# Patient Record
Sex: Female | Born: 1965
Health system: Southern US, Community
[De-identification: ages and names within clinical notes are randomized; demographics above are authoritative.]

## PROBLEM LIST (undated history)

## (undated) DIAGNOSIS — J4489 Other specified chronic obstructive pulmonary disease: Secondary | ICD-10-CM

## (undated) DIAGNOSIS — E669 Obesity, unspecified: Secondary | ICD-10-CM

## (undated) DIAGNOSIS — F329 Major depressive disorder, single episode, unspecified: Secondary | ICD-10-CM

## (undated) DIAGNOSIS — L80 Vitiligo: Secondary | ICD-10-CM

## (undated) DIAGNOSIS — Z9289 Personal history of other medical treatment: Secondary | ICD-10-CM

## (undated) DIAGNOSIS — I10 Essential (primary) hypertension: Secondary | ICD-10-CM

## (undated) DIAGNOSIS — E785 Hyperlipidemia, unspecified: Secondary | ICD-10-CM

## (undated) DIAGNOSIS — M1990 Unspecified osteoarthritis, unspecified site: Secondary | ICD-10-CM

## (undated) DIAGNOSIS — F32A Depression, unspecified: Secondary | ICD-10-CM

## (undated) DIAGNOSIS — G4730 Sleep apnea, unspecified: Secondary | ICD-10-CM

## (undated) DIAGNOSIS — E119 Type 2 diabetes mellitus without complications: Secondary | ICD-10-CM

## (undated) DIAGNOSIS — J449 Chronic obstructive pulmonary disease, unspecified: Secondary | ICD-10-CM

## (undated) DIAGNOSIS — I809 Phlebitis and thrombophlebitis of unspecified site: Secondary | ICD-10-CM

## (undated) DIAGNOSIS — I422 Other hypertrophic cardiomyopathy: Secondary | ICD-10-CM

## (undated) DIAGNOSIS — K219 Gastro-esophageal reflux disease without esophagitis: Secondary | ICD-10-CM

## (undated) DIAGNOSIS — K029 Dental caries, unspecified: Secondary | ICD-10-CM

## (undated) DIAGNOSIS — F419 Anxiety disorder, unspecified: Secondary | ICD-10-CM

## (undated) DIAGNOSIS — IMO0001 Reserved for inherently not codable concepts without codable children: Secondary | ICD-10-CM

## (undated) HISTORY — DX: Other hypertrophic cardiomyopathy: I42.2

## (undated) HISTORY — PX: DILATION AND CURETTAGE OF UTERUS: SHX78

## (undated) HISTORY — DX: Vitiligo: L80

## (undated) HISTORY — PX: CHOLECYSTECTOMY: SHX55

---

## 1998-09-20 ENCOUNTER — Emergency Department (HOSPITAL_COMMUNITY): Admission: EM | Admit: 1998-09-20 | Discharge: 1998-09-20 | Payer: Self-pay | Admitting: Emergency Medicine

## 1998-09-20 ENCOUNTER — Encounter: Payer: Self-pay | Admitting: Emergency Medicine

## 1999-02-21 ENCOUNTER — Other Ambulatory Visit: Admission: RE | Admit: 1999-02-21 | Discharge: 1999-02-21 | Payer: Self-pay | Admitting: Obstetrics and Gynecology

## 1999-10-28 ENCOUNTER — Emergency Department (HOSPITAL_COMMUNITY): Admission: EM | Admit: 1999-10-28 | Discharge: 1999-10-28 | Payer: Self-pay | Admitting: Emergency Medicine

## 1999-10-28 ENCOUNTER — Encounter: Payer: Self-pay | Admitting: Emergency Medicine

## 2000-07-25 ENCOUNTER — Encounter: Admission: RE | Admit: 2000-07-25 | Discharge: 2000-07-25 | Payer: Self-pay | Admitting: Family Medicine

## 2000-08-14 ENCOUNTER — Encounter: Admission: RE | Admit: 2000-08-14 | Discharge: 2000-08-14 | Payer: Self-pay | Admitting: Family Medicine

## 2001-03-21 ENCOUNTER — Encounter: Admission: RE | Admit: 2001-03-21 | Discharge: 2001-03-21 | Payer: Self-pay | Admitting: Sports Medicine

## 2001-05-05 ENCOUNTER — Encounter: Admission: RE | Admit: 2001-05-05 | Discharge: 2001-05-05 | Payer: Self-pay | Admitting: Sports Medicine

## 2001-06-12 ENCOUNTER — Encounter: Admission: RE | Admit: 2001-06-12 | Discharge: 2001-06-12 | Payer: Self-pay | Admitting: Family Medicine

## 2001-08-06 ENCOUNTER — Encounter: Admission: RE | Admit: 2001-08-06 | Discharge: 2001-08-06 | Payer: Self-pay | Admitting: Family Medicine

## 2001-10-29 ENCOUNTER — Emergency Department (HOSPITAL_COMMUNITY): Admission: EM | Admit: 2001-10-29 | Discharge: 2001-10-29 | Payer: Self-pay

## 2002-01-22 ENCOUNTER — Emergency Department (HOSPITAL_COMMUNITY): Admission: EM | Admit: 2002-01-22 | Discharge: 2002-01-23 | Payer: Self-pay | Admitting: Emergency Medicine

## 2002-01-22 ENCOUNTER — Encounter: Payer: Self-pay | Admitting: Emergency Medicine

## 2002-01-26 ENCOUNTER — Encounter: Payer: Self-pay | Admitting: Surgery

## 2002-01-27 ENCOUNTER — Ambulatory Visit (HOSPITAL_COMMUNITY): Admission: RE | Admit: 2002-01-27 | Discharge: 2002-01-28 | Payer: Self-pay | Admitting: Surgery

## 2002-01-27 ENCOUNTER — Encounter (INDEPENDENT_AMBULATORY_CARE_PROVIDER_SITE_OTHER): Payer: Self-pay | Admitting: Specialist

## 2002-03-01 ENCOUNTER — Emergency Department (HOSPITAL_COMMUNITY): Admission: EM | Admit: 2002-03-01 | Discharge: 2002-03-01 | Payer: Self-pay

## 2002-08-24 ENCOUNTER — Encounter (INDEPENDENT_AMBULATORY_CARE_PROVIDER_SITE_OTHER): Payer: Self-pay | Admitting: *Deleted

## 2002-08-24 LAB — CONVERTED CEMR LAB

## 2002-09-15 ENCOUNTER — Other Ambulatory Visit: Admission: RE | Admit: 2002-09-15 | Discharge: 2002-09-15 | Payer: Self-pay | Admitting: Gynecology

## 2002-09-21 ENCOUNTER — Encounter: Admission: RE | Admit: 2002-09-21 | Discharge: 2002-09-21 | Payer: Self-pay | Admitting: Family Medicine

## 2002-10-09 ENCOUNTER — Encounter: Admission: RE | Admit: 2002-10-09 | Discharge: 2002-10-09 | Payer: Self-pay | Admitting: Family Medicine

## 2002-10-19 ENCOUNTER — Ambulatory Visit (HOSPITAL_COMMUNITY): Admission: RE | Admit: 2002-10-19 | Discharge: 2002-10-19 | Payer: Self-pay | Admitting: Gynecology

## 2002-10-19 ENCOUNTER — Encounter (INDEPENDENT_AMBULATORY_CARE_PROVIDER_SITE_OTHER): Payer: Self-pay

## 2002-11-24 ENCOUNTER — Ambulatory Visit: Admission: RE | Admit: 2002-11-24 | Discharge: 2002-11-24 | Payer: Self-pay | Admitting: Gynecology

## 2002-11-25 ENCOUNTER — Encounter: Admission: RE | Admit: 2002-11-25 | Discharge: 2002-11-25 | Payer: Self-pay | Admitting: Family Medicine

## 2002-11-26 ENCOUNTER — Encounter: Admission: RE | Admit: 2002-11-26 | Discharge: 2002-11-26 | Payer: Self-pay | Admitting: Sports Medicine

## 2002-11-30 ENCOUNTER — Encounter: Admission: RE | Admit: 2002-11-30 | Discharge: 2002-11-30 | Payer: Self-pay | Admitting: Sports Medicine

## 2003-01-25 ENCOUNTER — Encounter: Admission: RE | Admit: 2003-01-25 | Discharge: 2003-01-25 | Payer: Self-pay | Admitting: Sports Medicine

## 2003-01-26 ENCOUNTER — Encounter: Admission: RE | Admit: 2003-01-26 | Discharge: 2003-01-26 | Payer: Self-pay | Admitting: Family Medicine

## 2003-01-27 ENCOUNTER — Encounter: Admission: RE | Admit: 2003-01-27 | Discharge: 2003-01-27 | Payer: Self-pay | Admitting: Family Medicine

## 2003-06-01 ENCOUNTER — Encounter: Admission: RE | Admit: 2003-06-01 | Discharge: 2003-06-01 | Payer: Self-pay | Admitting: Family Medicine

## 2003-08-05 ENCOUNTER — Emergency Department (HOSPITAL_COMMUNITY): Admission: EM | Admit: 2003-08-05 | Discharge: 2003-08-05 | Payer: Self-pay | Admitting: Emergency Medicine

## 2004-02-24 ENCOUNTER — Ambulatory Visit: Payer: Self-pay | Admitting: Sports Medicine

## 2004-03-06 ENCOUNTER — Emergency Department (HOSPITAL_COMMUNITY): Admission: EM | Admit: 2004-03-06 | Discharge: 2004-03-06 | Payer: Self-pay | Admitting: Emergency Medicine

## 2004-05-25 ENCOUNTER — Ambulatory Visit (HOSPITAL_COMMUNITY): Admission: RE | Admit: 2004-05-25 | Discharge: 2004-05-25 | Payer: Self-pay | Admitting: General Surgery

## 2004-05-25 HISTORY — PX: UMBILICAL HERNIA REPAIR: SHX196

## 2004-05-28 ENCOUNTER — Inpatient Hospital Stay (HOSPITAL_COMMUNITY): Admission: EM | Admit: 2004-05-28 | Discharge: 2004-06-01 | Payer: Self-pay | Admitting: Emergency Medicine

## 2005-02-15 ENCOUNTER — Ambulatory Visit (HOSPITAL_COMMUNITY): Admission: RE | Admit: 2005-02-15 | Discharge: 2005-02-15 | Payer: Self-pay | Admitting: Emergency Medicine

## 2006-08-22 DIAGNOSIS — I10 Essential (primary) hypertension: Secondary | ICD-10-CM

## 2006-08-22 DIAGNOSIS — E785 Hyperlipidemia, unspecified: Secondary | ICD-10-CM

## 2006-08-23 ENCOUNTER — Encounter (INDEPENDENT_AMBULATORY_CARE_PROVIDER_SITE_OTHER): Payer: Self-pay | Admitting: *Deleted

## 2006-08-30 ENCOUNTER — Emergency Department (HOSPITAL_COMMUNITY): Admission: EM | Admit: 2006-08-30 | Discharge: 2006-08-31 | Payer: Self-pay | Admitting: Emergency Medicine

## 2006-08-31 ENCOUNTER — Ambulatory Visit: Payer: Self-pay | Admitting: Pulmonary Disease

## 2006-08-31 ENCOUNTER — Emergency Department (HOSPITAL_COMMUNITY): Admission: EM | Admit: 2006-08-31 | Discharge: 2006-08-31 | Payer: Self-pay | Admitting: Emergency Medicine

## 2006-08-31 ENCOUNTER — Inpatient Hospital Stay (HOSPITAL_COMMUNITY): Admission: EM | Admit: 2006-08-31 | Discharge: 2006-09-03 | Payer: Self-pay | Admitting: Emergency Medicine

## 2006-12-30 ENCOUNTER — Encounter (HOSPITAL_BASED_OUTPATIENT_CLINIC_OR_DEPARTMENT_OTHER): Admission: RE | Admit: 2006-12-30 | Discharge: 2007-01-15 | Payer: Self-pay | Admitting: Internal Medicine

## 2007-05-07 ENCOUNTER — Ambulatory Visit: Payer: Self-pay | Admitting: Family Medicine

## 2007-05-07 ENCOUNTER — Ambulatory Visit: Payer: Self-pay | Admitting: *Deleted

## 2007-07-07 ENCOUNTER — Ambulatory Visit: Payer: Self-pay | Admitting: Family Medicine

## 2007-07-07 ENCOUNTER — Ambulatory Visit: Payer: Self-pay | Admitting: Internal Medicine

## 2007-07-07 ENCOUNTER — Encounter: Payer: Self-pay | Admitting: Family Medicine

## 2007-07-07 LAB — CONVERTED CEMR LAB
ALT: 18 units/L (ref 0–35)
Albumin: 3.9 g/dL (ref 3.5–5.2)
BUN: 11 mg/dL (ref 6–23)
CO2: 25 meq/L (ref 19–32)
Cholesterol: 215 mg/dL — ABNORMAL HIGH (ref 0–200)
Creatinine, Ser: 0.82 mg/dL (ref 0.40–1.20)
LDL Cholesterol: 137 mg/dL — ABNORMAL HIGH (ref 0–99)
Sodium: 140 meq/L (ref 135–145)
TSH: 2.131 microintl units/mL (ref 0.350–5.50)
Total CHOL/HDL Ratio: 5.2
Total Protein: 7.6 g/dL (ref 6.0–8.3)

## 2007-07-08 ENCOUNTER — Ambulatory Visit (HOSPITAL_COMMUNITY): Admission: RE | Admit: 2007-07-08 | Discharge: 2007-07-08 | Payer: Self-pay | Admitting: Family Medicine

## 2007-09-03 ENCOUNTER — Ambulatory Visit: Payer: Self-pay | Admitting: Internal Medicine

## 2007-09-11 ENCOUNTER — Ambulatory Visit: Payer: Self-pay | Admitting: Family Medicine

## 2007-10-14 ENCOUNTER — Ambulatory Visit: Payer: Self-pay | Admitting: Internal Medicine

## 2007-10-27 ENCOUNTER — Ambulatory Visit: Payer: Self-pay | Admitting: Internal Medicine

## 2007-11-13 ENCOUNTER — Ambulatory Visit: Payer: Self-pay | Admitting: Family Medicine

## 2007-11-13 ENCOUNTER — Ambulatory Visit: Payer: Self-pay | Admitting: Internal Medicine

## 2007-11-13 ENCOUNTER — Encounter: Payer: Self-pay | Admitting: Family Medicine

## 2007-11-13 LAB — CONVERTED CEMR LAB
ALT: 19 units/L (ref 0–35)
Basophils Absolute: 0.1 10*3/uL (ref 0.0–0.1)
CO2: 24 meq/L (ref 19–32)
Chloride: 104 meq/L (ref 96–112)
Cholesterol: 156 mg/dL (ref 0–200)
Creatinine, Ser: 0.9 mg/dL (ref 0.40–1.20)
HCT: 40.5 % (ref 36.0–46.0)
Hemoglobin: 12.3 g/dL (ref 12.0–15.0)
Lymphocytes Relative: 26 % (ref 12–46)
Lymphs Abs: 2.9 10*3/uL (ref 0.7–4.0)
MCV: 90.8 fL (ref 78.0–100.0)
Monocytes Absolute: 0.8 10*3/uL (ref 0.1–1.0)
Neutro Abs: 7.3 10*3/uL (ref 1.7–7.7)
Neutrophils Relative %: 65 % (ref 43–77)
Platelets: 316 10*3/uL (ref 150–400)
RBC: 4.46 M/uL (ref 3.87–5.11)
RDW: 16.7 % — ABNORMAL HIGH (ref 11.5–15.5)
Sodium: 140 meq/L (ref 135–145)
Total CHOL/HDL Ratio: 3.8
Total Protein: 7.7 g/dL (ref 6.0–8.3)
Triglycerides: 103 mg/dL (ref <150)
VLDL: 21 mg/dL (ref 0–40)

## 2007-12-08 ENCOUNTER — Ambulatory Visit: Payer: Self-pay | Admitting: Internal Medicine

## 2008-02-16 ENCOUNTER — Ambulatory Visit: Payer: Self-pay | Admitting: Internal Medicine

## 2008-04-13 ENCOUNTER — Emergency Department (HOSPITAL_COMMUNITY): Admission: EM | Admit: 2008-04-13 | Discharge: 2008-04-13 | Payer: Self-pay | Admitting: Emergency Medicine

## 2008-04-21 ENCOUNTER — Ambulatory Visit: Payer: Self-pay | Admitting: Family Medicine

## 2008-05-13 ENCOUNTER — Ambulatory Visit: Payer: Self-pay | Admitting: Internal Medicine

## 2008-07-14 ENCOUNTER — Ambulatory Visit: Payer: Self-pay | Admitting: Family Medicine

## 2008-09-09 ENCOUNTER — Ambulatory Visit: Payer: Self-pay | Admitting: Internal Medicine

## 2008-09-21 ENCOUNTER — Encounter: Payer: Self-pay | Admitting: Family Medicine

## 2008-09-21 ENCOUNTER — Ambulatory Visit: Payer: Self-pay | Admitting: Internal Medicine

## 2008-09-21 LAB — CONVERTED CEMR LAB
Cholesterol: 156 mg/dL (ref 0–200)
LDL Cholesterol: 95 mg/dL (ref 0–99)
Total CHOL/HDL Ratio: 4.2
Triglycerides: 120 mg/dL (ref <150)
VLDL: 24 mg/dL (ref 0–40)

## 2008-09-27 ENCOUNTER — Ambulatory Visit: Payer: Self-pay | Admitting: Internal Medicine

## 2008-09-27 ENCOUNTER — Encounter: Payer: Self-pay | Admitting: Family Medicine

## 2008-09-27 LAB — CONVERTED CEMR LAB: Chlamydia, DNA Probe: NEGATIVE

## 2008-10-01 ENCOUNTER — Ambulatory Visit (HOSPITAL_COMMUNITY): Admission: RE | Admit: 2008-10-01 | Discharge: 2008-10-01 | Payer: Self-pay | Admitting: Family Medicine

## 2008-11-26 ENCOUNTER — Ambulatory Visit: Payer: Self-pay | Admitting: Internal Medicine

## 2008-12-30 ENCOUNTER — Emergency Department (HOSPITAL_COMMUNITY): Admission: EM | Admit: 2008-12-30 | Discharge: 2008-12-30 | Payer: Self-pay | Admitting: Emergency Medicine

## 2009-01-31 ENCOUNTER — Telehealth (INDEPENDENT_AMBULATORY_CARE_PROVIDER_SITE_OTHER): Payer: Self-pay | Admitting: *Deleted

## 2009-02-21 ENCOUNTER — Ambulatory Visit: Payer: Self-pay | Admitting: Internal Medicine

## 2009-02-21 ENCOUNTER — Encounter: Payer: Self-pay | Admitting: Family Medicine

## 2009-02-21 LAB — CONVERTED CEMR LAB
Alkaline Phosphatase: 76 units/L (ref 39–117)
BUN: 9 mg/dL (ref 6–23)
Calcium: 9.3 mg/dL (ref 8.4–10.5)
Glucose, Bld: 97 mg/dL (ref 70–99)
LDL Cholesterol: 105 mg/dL — ABNORMAL HIGH (ref 0–99)
Total Bilirubin: 0.4 mg/dL (ref 0.3–1.2)
Total CHOL/HDL Ratio: 4.9

## 2009-02-25 ENCOUNTER — Ambulatory Visit: Payer: Self-pay | Admitting: Internal Medicine

## 2009-03-29 ENCOUNTER — Ambulatory Visit: Payer: Self-pay | Admitting: Family Medicine

## 2009-05-16 ENCOUNTER — Ambulatory Visit: Payer: Self-pay | Admitting: Family Medicine

## 2009-08-04 ENCOUNTER — Ambulatory Visit: Payer: Self-pay | Admitting: Internal Medicine

## 2009-08-17 ENCOUNTER — Ambulatory Visit: Payer: Self-pay | Admitting: Internal Medicine

## 2009-08-17 LAB — CONVERTED CEMR LAB
ALT: 24 units/L (ref 0–35)
AST: 24 units/L (ref 0–37)
BUN: 10 mg/dL (ref 6–23)
Calcium: 9.2 mg/dL (ref 8.4–10.5)
Eosinophils Relative: 2 % (ref 0–5)
Ferritin: 116 ng/mL (ref 10–291)
HCT: 40.6 % (ref 36.0–46.0)
Hemoglobin: 12.5 g/dL (ref 12.0–15.0)
Hgb A1c MFr Bld: 5.6 % (ref 4.6–6.1)
MCV: 89.4 fL (ref 78.0–100.0)
Monocytes Absolute: 0.6 10*3/uL (ref 0.1–1.0)
Monocytes Relative: 7 % (ref 3–12)
RBC: 4.54 M/uL (ref 3.87–5.11)
RDW: 15.3 % (ref 11.5–15.5)
Saturation Ratios: 15 % — ABNORMAL LOW (ref 20–55)
Sodium: 140 meq/L (ref 135–145)
TIBC: 283 ug/dL (ref 250–470)
Total Bilirubin: 0.4 mg/dL (ref 0.3–1.2)
Total Protein: 7.4 g/dL (ref 6.0–8.3)
UIBC: 241 ug/dL
WBC: 9.4 10*3/uL (ref 4.0–10.5)

## 2009-09-22 ENCOUNTER — Emergency Department (HOSPITAL_COMMUNITY): Admission: EM | Admit: 2009-09-22 | Discharge: 2009-09-22 | Payer: Self-pay | Admitting: Emergency Medicine

## 2009-10-20 ENCOUNTER — Emergency Department (HOSPITAL_BASED_OUTPATIENT_CLINIC_OR_DEPARTMENT_OTHER): Admission: EM | Admit: 2009-10-20 | Discharge: 2009-10-20 | Payer: Self-pay | Admitting: Emergency Medicine

## 2009-10-31 ENCOUNTER — Ambulatory Visit: Payer: Self-pay | Admitting: Internal Medicine

## 2009-11-03 ENCOUNTER — Emergency Department (HOSPITAL_COMMUNITY): Admission: EM | Admit: 2009-11-03 | Discharge: 2009-11-03 | Payer: Self-pay | Admitting: Emergency Medicine

## 2009-11-09 ENCOUNTER — Inpatient Hospital Stay (HOSPITAL_COMMUNITY): Admission: EM | Admit: 2009-11-09 | Discharge: 2009-11-11 | Payer: Self-pay | Admitting: Emergency Medicine

## 2009-11-09 ENCOUNTER — Telehealth (INDEPENDENT_AMBULATORY_CARE_PROVIDER_SITE_OTHER): Payer: Self-pay | Admitting: *Deleted

## 2009-11-29 ENCOUNTER — Ambulatory Visit: Payer: Self-pay | Admitting: Family Medicine

## 2009-11-29 ENCOUNTER — Encounter (INDEPENDENT_AMBULATORY_CARE_PROVIDER_SITE_OTHER): Payer: Self-pay | Admitting: Internal Medicine

## 2009-11-29 ENCOUNTER — Ambulatory Visit: Payer: Self-pay | Admitting: Internal Medicine

## 2009-11-29 LAB — CONVERTED CEMR LAB: IgE (Immunoglobulin E), Serum: 214.8 intl units/mL — ABNORMAL HIGH (ref 0.0–180.0)

## 2009-12-06 ENCOUNTER — Emergency Department (HOSPITAL_COMMUNITY): Admission: EM | Admit: 2009-12-06 | Discharge: 2009-12-07 | Payer: Self-pay | Admitting: Emergency Medicine

## 2009-12-12 ENCOUNTER — Ambulatory Visit: Payer: Self-pay | Admitting: Internal Medicine

## 2009-12-12 LAB — CONVERTED CEMR LAB
AST: 22 units/L (ref 0–37)
BUN: 17 mg/dL (ref 6–23)
Creatinine, Ser: 0.88 mg/dL (ref 0.40–1.20)
Glucose, Bld: 94 mg/dL (ref 70–99)
Magnesium: 2.1 mg/dL (ref 1.5–2.5)
Potassium: 4.2 meq/L (ref 3.5–5.3)
Total Bilirubin: 0.2 mg/dL — ABNORMAL LOW (ref 0.3–1.2)
Total Protein: 7 g/dL (ref 6.0–8.3)

## 2009-12-21 ENCOUNTER — Ambulatory Visit: Payer: Self-pay | Admitting: Internal Medicine

## 2010-01-04 ENCOUNTER — Ambulatory Visit: Payer: Self-pay | Admitting: Internal Medicine

## 2010-01-04 LAB — CONVERTED CEMR LAB: Anti Nuclear Antibody(ANA): NEGATIVE

## 2010-02-10 ENCOUNTER — Ambulatory Visit: Payer: Self-pay | Admitting: Internal Medicine

## 2010-02-24 ENCOUNTER — Ambulatory Visit: Payer: Self-pay | Admitting: Family Medicine

## 2010-02-24 LAB — CONVERTED CEMR LAB
Anti Nuclear Antibody(ANA): NEGATIVE
CRP: 1.2 mg/dL — ABNORMAL HIGH (ref <0.6)
Rhuematoid fact SerPl-aCnc: 20 intl units/mL (ref 0–20)

## 2010-03-14 ENCOUNTER — Encounter (INDEPENDENT_AMBULATORY_CARE_PROVIDER_SITE_OTHER): Payer: Self-pay | Admitting: Family Medicine

## 2010-03-14 ENCOUNTER — Ambulatory Visit: Payer: Self-pay | Admitting: Surgery

## 2010-03-14 ENCOUNTER — Ambulatory Visit (HOSPITAL_COMMUNITY): Admission: RE | Admit: 2010-03-14 | Discharge: 2010-03-14 | Payer: Self-pay | Admitting: Family Medicine

## 2010-04-11 ENCOUNTER — Ambulatory Visit: Payer: Self-pay | Admitting: Internal Medicine

## 2010-05-12 ENCOUNTER — Encounter (INDEPENDENT_AMBULATORY_CARE_PROVIDER_SITE_OTHER): Payer: Self-pay | Admitting: *Deleted

## 2010-07-04 ENCOUNTER — Emergency Department (HOSPITAL_COMMUNITY)
Admission: EM | Admit: 2010-07-04 | Discharge: 2010-07-04 | Payer: Self-pay | Source: Home / Self Care | Admitting: Emergency Medicine

## 2010-07-27 NOTE — Progress Notes (Signed)
Summary: triage/SOB  Phone Note Call from Patient   Caller: Patient Reason for Call: Talk to Nurse Summary of Call: patient called stating she had seen Dr. Pamalee Leyden on 5/9 forAsthmatic Bronchitis on then went to ED on 5/12 and was treated there...she states she has finished all of her medication and has inhalers and nothing is working.Marland KitchenMarland KitchenShe sounds SOB over the phone and I can hear her wheezing.Marland KitchenMarland KitchenPatient advised to return to ED as their discharge instructions suggest..Marland KitchenThere are no appointments or acute appointments in our schedule.Marland KitchenMarland KitchenPatient states understanding. Initial call taken by: Conchita Paris,  Nov 09, 2009 3:06 PM

## 2010-09-10 LAB — DIFFERENTIAL
Basophils Relative: 0 % (ref 0–1)
Eosinophils Relative: 1 % (ref 0–5)
Lymphocytes Relative: 21 % (ref 12–46)
Lymphs Abs: 3.3 10*3/uL (ref 0.7–4.0)
Monocytes Absolute: 1.6 10*3/uL — ABNORMAL HIGH (ref 0.1–1.0)
Neutrophils Relative %: 67 % (ref 43–77)

## 2010-09-10 LAB — BASIC METABOLIC PANEL
Calcium: 9 mg/dL (ref 8.4–10.5)
Creatinine, Ser: 0.93 mg/dL (ref 0.4–1.2)
GFR calc non Af Amer: >60 mL/min (ref >60)
Glucose, Bld: 123 mg/dL — ABNORMAL HIGH (ref 70–99)

## 2010-09-10 LAB — CBC
HCT: 33.3 % — ABNORMAL LOW (ref 36.0–46.0)
Hemoglobin: 11 g/dL — ABNORMAL LOW (ref 12.0–15.0)
MCHC: 33 g/dL (ref 30.0–36.0)
Platelets: 374 10*3/uL (ref 150–400)
RBC: 3.74 MIL/uL — ABNORMAL LOW (ref 3.87–5.11)

## 2010-09-11 LAB — BASIC METABOLIC PANEL
BUN: 9 mg/dL (ref 6–23)
CO2: 24 mEq/L (ref 19–32)
CO2: 27 mEq/L (ref 19–32)
Calcium: 9.2 mg/dL (ref 8.4–10.5)
Chloride: 104 mEq/L (ref 96–112)
Chloride: 105 mEq/L (ref 96–112)
Creatinine, Ser: 0.77 mg/dL (ref 0.4–1.2)
GFR calc Af Amer: >60 mL/min (ref >60)
Potassium: 4.5 mEq/L (ref 3.5–5.1)
Sodium: 137 mEq/L (ref 135–145)

## 2010-09-11 LAB — DIFFERENTIAL
Basophils Absolute: 0.2 10*3/uL — ABNORMAL HIGH (ref 0.0–0.1)
Basophils Relative: 1 % (ref 0–1)
Lymphs Abs: 3.4 10*3/uL (ref 0.7–4.0)
Monocytes Absolute: 1.1 10*3/uL — ABNORMAL HIGH (ref 0.1–1.0)
Monocytes Relative: 2 % — ABNORMAL LOW (ref 3–12)
Monocytes Relative: 6 % (ref 3–12)
Neutro Abs: 12.8 10*3/uL — ABNORMAL HIGH (ref 1.7–7.7)
Neutro Abs: 17 10*3/uL — ABNORMAL HIGH (ref 1.7–7.7)
Neutrophils Relative %: 93 % — ABNORMAL HIGH (ref 43–77)

## 2010-09-11 LAB — PHOSPHORUS: Phosphorus: 3 mg/dL (ref 2.3–4.6)

## 2010-09-11 LAB — CBC
HCT: 37.8 % (ref 36.0–46.0)
MCHC: 33 g/dL (ref 30.0–36.0)
MCHC: 33 g/dL (ref 30.0–36.0)
MCV: 88.2 fL (ref 78.0–100.0)
Platelets: 288 10*3/uL (ref 150–400)
RBC: 4.4 MIL/uL (ref 3.87–5.11)
WBC: 18 10*3/uL — ABNORMAL HIGH (ref 4.0–10.5)
WBC: 18.2 10*3/uL — ABNORMAL HIGH (ref 4.0–10.5)

## 2010-09-11 LAB — HEMOGLOBIN A1C
Hgb A1c MFr Bld: 5.6 % (ref <5.7)
Mean Plasma Glucose: 114 mg/dL (ref <117)

## 2010-09-11 LAB — CARDIAC PANEL(CRET KIN+CKTOT+MB+TROPI)
CK, MB: 2.3 ng/mL (ref 0.3–4.0)
Relative Index: INVALID (ref 0.0–2.5)
Total CK: 84 U/L (ref 7–177)

## 2010-09-11 LAB — POCT I-STAT 3, ART BLOOD GAS (G3+)
O2 Saturation: 97 %
Patient temperature: 98.1
TCO2: 25 mmol/L (ref 0–100)
pH, Arterial: 7.39 (ref 7.350–7.400)

## 2010-09-11 LAB — GLUCOSE, CAPILLARY
Glucose-Capillary: 140 mg/dL — ABNORMAL HIGH (ref 70–99)
Glucose-Capillary: 161 mg/dL — ABNORMAL HIGH (ref 70–99)
Glucose-Capillary: 184 mg/dL — ABNORMAL HIGH (ref 70–99)

## 2010-09-11 LAB — CK TOTAL AND CKMB (NOT AT ARMC)
CK, MB: 2.8 ng/mL (ref 0.3–4.0)
Relative Index: INVALID (ref 0.0–2.5)

## 2010-09-11 LAB — BRAIN NATRIURETIC PEPTIDE: Pro B Natriuretic peptide (BNP): 82 pg/mL (ref 0.0–100.0)

## 2010-09-11 LAB — TROPONIN I: Troponin I: 0.03 ng/mL (ref 0.00–0.06)

## 2010-09-12 LAB — URINALYSIS, ROUTINE W REFLEX MICROSCOPIC
Bilirubin Urine: NEGATIVE
Glucose, UA: NEGATIVE mg/dL
Hgb urine dipstick: NEGATIVE
Protein, ur: NEGATIVE mg/dL
Urobilinogen, UA: 0.2 mg/dL (ref 0.0–1.0)

## 2010-09-12 LAB — BASIC METABOLIC PANEL
CO2: 27 mEq/L (ref 19–32)
Chloride: 108 mEq/L (ref 96–112)
GFR calc Af Amer: >60 mL/min (ref >60)
Potassium: 3.8 mEq/L (ref 3.5–5.1)
Sodium: 146 mEq/L — ABNORMAL HIGH (ref 135–145)

## 2010-09-12 LAB — POCT I-STAT, CHEM 8
BUN: 11 mg/dL (ref 6–23)
Calcium, Ion: 1.21 mmol/L (ref 1.12–1.32)
Chloride: 103 mEq/L (ref 96–112)
HCT: 38 % (ref 36.0–46.0)
Potassium: 3.8 mEq/L (ref 3.5–5.1)
Sodium: 139 mEq/L (ref 135–145)

## 2010-09-12 LAB — BRAIN NATRIURETIC PEPTIDE: Pro B Natriuretic peptide (BNP): <30 pg/mL (ref 0.0–100.0)

## 2010-09-12 LAB — CBC
HCT: 39.1 % (ref 36.0–46.0)
Hemoglobin: 12 g/dL (ref 12.0–15.0)
Hemoglobin: 12.7 g/dL (ref 12.0–15.0)
MCHC: 33 g/dL (ref 30.0–36.0)
MCHC: 32.5 g/dL (ref 30.0–36.0)
MCV: 86.7 fL (ref 78.0–100.0)
RBC: 4.13 MIL/uL (ref 3.87–5.11)
RBC: 4.51 MIL/uL (ref 3.87–5.11)
RDW: 15.2 % (ref 11.5–15.5)
WBC: 10.8 10*3/uL — ABNORMAL HIGH (ref 4.0–10.5)

## 2010-09-12 LAB — POCT TOXICOLOGY PANEL

## 2010-09-12 LAB — DIFFERENTIAL
Basophils Absolute: 0 10*3/uL (ref 0.0–0.1)
Basophils Relative: 0 % (ref 0–1)
Basophils Relative: 1 % (ref 0–1)
Eosinophils Absolute: 0.2 10*3/uL (ref 0.0–0.7)
Lymphocytes Relative: 21 % (ref 12–46)
Lymphs Abs: 2.7 10*3/uL (ref 0.7–4.0)
Monocytes Absolute: 0.9 10*3/uL (ref 0.1–1.0)
Monocytes Absolute: 0.6 10*3/uL (ref 0.1–1.0)
Monocytes Relative: 6 % (ref 3–12)
Neutro Abs: 7.1 10*3/uL (ref 1.7–7.7)
Neutrophils Relative %: 67 % (ref 43–77)
Neutrophils Relative %: 67 % (ref 43–77)

## 2010-09-14 ENCOUNTER — Ambulatory Visit: Payer: Self-pay | Admitting: Rehabilitation

## 2010-09-26 ENCOUNTER — Ambulatory Visit: Payer: Self-pay | Attending: Family Medicine | Admitting: Rehabilitation

## 2010-09-26 DIAGNOSIS — IMO0001 Reserved for inherently not codable concepts without codable children: Secondary | ICD-10-CM | POA: Insufficient documentation

## 2010-09-26 DIAGNOSIS — R262 Difficulty in walking, not elsewhere classified: Secondary | ICD-10-CM | POA: Insufficient documentation

## 2010-09-26 DIAGNOSIS — R5381 Other malaise: Secondary | ICD-10-CM | POA: Insufficient documentation

## 2010-09-26 DIAGNOSIS — M25669 Stiffness of unspecified knee, not elsewhere classified: Secondary | ICD-10-CM | POA: Insufficient documentation

## 2010-09-26 DIAGNOSIS — M25569 Pain in unspecified knee: Secondary | ICD-10-CM | POA: Insufficient documentation

## 2010-10-02 ENCOUNTER — Ambulatory Visit: Payer: Self-pay

## 2010-10-04 ENCOUNTER — Ambulatory Visit: Payer: Self-pay

## 2010-10-10 ENCOUNTER — Ambulatory Visit: Payer: Self-pay | Admitting: Rehabilitation

## 2010-10-11 ENCOUNTER — Ambulatory Visit: Payer: Self-pay | Admitting: Rehabilitation

## 2010-10-16 ENCOUNTER — Ambulatory Visit: Payer: Self-pay

## 2010-10-18 ENCOUNTER — Ambulatory Visit: Payer: Self-pay

## 2010-10-23 ENCOUNTER — Ambulatory Visit: Payer: Self-pay

## 2010-10-25 ENCOUNTER — Ambulatory Visit: Payer: Self-pay | Attending: Family Medicine | Admitting: Physical Therapy

## 2010-10-25 DIAGNOSIS — R5381 Other malaise: Secondary | ICD-10-CM | POA: Insufficient documentation

## 2010-10-25 DIAGNOSIS — R262 Difficulty in walking, not elsewhere classified: Secondary | ICD-10-CM | POA: Insufficient documentation

## 2010-10-25 DIAGNOSIS — M25569 Pain in unspecified knee: Secondary | ICD-10-CM | POA: Insufficient documentation

## 2010-10-25 DIAGNOSIS — IMO0001 Reserved for inherently not codable concepts without codable children: Secondary | ICD-10-CM | POA: Insufficient documentation

## 2010-10-25 DIAGNOSIS — M25669 Stiffness of unspecified knee, not elsewhere classified: Secondary | ICD-10-CM | POA: Insufficient documentation

## 2010-10-31 ENCOUNTER — Ambulatory Visit: Payer: Self-pay | Admitting: Physical Therapy

## 2010-10-31 ENCOUNTER — Encounter: Payer: Self-pay | Admitting: Rehabilitation

## 2010-11-01 ENCOUNTER — Ambulatory Visit: Payer: Self-pay | Admitting: Physical Therapy

## 2010-11-02 ENCOUNTER — Encounter: Payer: Self-pay | Admitting: Rehabilitation

## 2010-11-06 ENCOUNTER — Encounter: Payer: Self-pay | Admitting: Physical Therapy

## 2010-11-07 NOTE — Assessment & Plan Note (Signed)
[facility]   NAME:  Beth, [name]               ACCOUNT NO.:  [id number]   MEDICAL RECORD NO.:  [id number]      DATE OF BIRTH:  1966-02-15   PHYSICIAN:  Maxwell Caul, M.D. VISIT DATE:  01/02/2007                                   OFFICE VISIT   Beth [name] is a 45 year old woman kindly referred through the office of  her dermatologist, Dr. Doristine Section.  She had seen Dr. Joseph Art on June 3  for hypopigmented areas on her anterior legs, swelling and discomfort in  her lower legs.  There were no leg ulcerations and no other dermatologic  problems.  Dr. Joseph Art assessed her as having stasis dermatitis and  varicose veins and she is here for an opinion on this.   Beth [name] tells me that this problem has been present for 3 months  although her husband actually states he noticed his first 8 months ago.  She developed some discomfort in her legs which is present whether she  is walking or not.  She describes the discomfort as variably pain and/or  itching. She feels her skin is dry.  She is uncomfortable when she is  sitting at work or when she is standing.  It does not seem to make any  difference.  She has not noticed any open wounds.  The discomfort she  describes does not fit a claudication pattern.  Also, interestingly, her  husband noticed, 8 months ago, that she developed some depigmentation on  the anterior surface of her legs.  Later on she showed me and a similar  area on her right fourth digit in her upper extremities.   PAST MEDICAL HISTORY INCLUDES:  Hypertension, anxiety, and depression.  She is not a diabetic.  She quit smoking 4 years ago.  She has had  gallbladder surgery and hernia repair; and had a particularly bad  nosebleed in March 2008, although it does not  sound as though this has  been a recurrent problem.   CURRENT MEDICATIONS:  1. Wellbutrin XL 300 daily.  2. Celexa 40 one and one-half daily.  3. Hydrochlorothiazide 25 daily and  lisinopril 10 daily.   SOCIAL HISTORY:  Socially she works in [employer].  She is married.  She has had no children.  No prior pregnancies.   PHYSICAL EXAMINATION:  GENERAL:  An obese 45 year old woman who  otherwise looks her stated age.  VITAL SIGNS:  Temperature is 98.1, pulse 80, respirations 18, blood  pressure was 128/70.  RESPIRATORY:  Clear air entry bilaterally.  CARDIAC:  Heart sounds are normal.  There is no S4.  No carotid bruits.  No murmurs were appreciated.  EXTREMITIES:  Her dorsalis pedis pulses were difficult to feel. I did  appreciate a left greater than right posterior tibial pulse.   WOUND EXAM:  There were no open wounds.  Both her lower extremities had  that tense edema that was not particularly pitting.  There was  depigmentation especially the right anterior pretibial area.  There was  a small round areas of different pigmentation of her skin that almost  looked like healed ulcers although she assured me that none historically  have been present.  She did have what appeared to be prominent veins;  and I wondered whether this was a livedo reticularis pattern.  Because  of the indeterminate pulses.  We did ABIs on her on the left.  This was  0.92 and 1.17 on the right.   IMPRESSION:  1. Bilateral lower extremity swelling and discomfort with pigment      changes.  I found this all was somewhat difficult to explain.  I      suppose it is possible she has venous stasis, although that      certainly would not account for the depigmentation.  She is an      obese female on hypertensive medications; she certainly would be at      risk for this.  The pattern of the vessels in her legs maybe wonder      about livedo reticularis either as a primary problem or secondary      problem.  I have gone ahead and checked lab work on her to include      a comprehensive metabolic panel, CBC, sedimentation rate and ANA.      I did go ahead and wrap her legs with a Profore lite.   This is all      venous stasis related then she might benefit from the wraps.  I did      this as much as a diagnostic challenge as well as for therapeutics.      We will see her next week at which time her lab work should be      available.  I should mention that there was no evidence of a DVT or      a cellulitis.  A duplex ultrasound of her venous system might be in      order; however, I did not arrange this today.           ______________________________  Maxwell Caul, M.D.     MGR/MEDQ  D:  01/02/2007  T:  01/03/2007  Job:  [id number]

## 2010-11-07 NOTE — Assessment & Plan Note (Signed)
[facility]   NAME:  Beth [name], Beth [name]               ACCOUNT NO.:  [id number]   MEDICAL RECORD NO.:  [id number]      DATE OF BIRTH:  [birth year]   PHYSICIAN:  Jake Shark A. Tanda Rockers, M.D. VISIT DATE:  01/09/2007                                   OFFICE VISIT   SUBJECTIVE:  Ms. Beth [name] is a 45 year old lady who is seen in follow-up  for bilateral lower extremity swelling.  In the interim, she was placed  in Profore lights and a series of laboratories were obtained. She  returns for follow-up.  She is still complaining of pain in the ankles.  There has been no interim fever.  There have been no ulcerations.   OBJECTIVE:  Blood pressure is 126/76, respirations 20, pulse rate of 88,  temperature is 98.3.  Inspection of the lower extremity shows chronic  changes of stasis.  There are no open wounds.  There is bilateral 2-3+  edema.  Pedal pulses are readily palpable.  Capillary refill is normal.  Sensation is normal.   ASSESSMENT:  Stasis dermatitis.   RECOMMENDATIONS:  We have prescribed bilateral open-toed 30-40 mm  compression hose.  We have explained the nature of stasis dermatitis to  the patient in terms that she seems to understand.  We have encouraged  her to procure the below-the-knee garments and to wear them as directed;  specifically, she is not to sleep in the garments, they are to be placed  on during the morning upon awakening and to be worn throughout the day.  She will need to replace the garments every 3-4 months.  The laboratory  showed a low-normal hemoglobin and hematocrit with normal renal function  tests, normal electrolytes, and negative anti-nuclei antibody and an  elevated sedimentation rate.  The patient is discharged to be followed  up by her primary care physician.      Harold A. Tanda Rockers, M.D.  Electronically Signed     HAN/MEDQ  D:  01/09/2007  T:  01/09/2007  Job:  [id number]   cc:   Karie Soda. Joseph Art, M.D.  Nilda Simmer, M.D.

## 2010-11-08 ENCOUNTER — Ambulatory Visit: Payer: Self-pay | Admitting: Physical Therapy

## 2010-11-10 NOTE — Consult Note (Signed)
[name], [name]                           ACCOUNT NO.:  [id number]   MEDICAL RECORD NO.:  [id number]                   PATIENT TYPE:  EMS   LOCATION:  MINO                                 FACILITY:  [facility]   PHYSICIAN:  Douglas A. Magnus Ivan, M.D.           DATE OF BIRTH:  [birth year]   DATE OF CONSULTATION:  DATE OF DISCHARGE:  01/23/2002                           GENERAL SURGERY CONSULTATION   CHIEF COMPLAINT:  Abdominal pain, nausea and vomiting.   HISTORY OF PRESENT ILLNESS:  Beth [name] is a very pleasant, morbidly  obese female who developed sudden onset of right upper quadrant abdominal  pain, hurting deep to her back, this evening after a fatty meal.  She also  had associated nausea and vomiting.  After coming to the emergency  department and receiving pain medication, she has since had her pain  resolve.  She reports this is her first ever episode.  She had no chest  pain, fever, shortness of breath.  She has no dysuria and moves her bowel  well.   REVIEW OF SYSTEMS:  Otherwise unremarkable.   PAST MEDICAL HISTORY:  Negative.   PAST SURGICAL HISTORY:  Negative   ALLERGIES:  No known drug allergies   MEDICATIONS:  None.   SOCIAL HISTORY:  She does not drink, does not drink alcohol.   PHYSICAL EXAMINATION:  GENERAL:  Morbidly obese female in no acute distress.  VITAL SIGNS:  Temperature 97.2, respiratory rate 20, heart rate 67, blood  pressure 163/98.  HEENT:  Anicteric, oropharynx clear.  NECK:  Supple.  LUNGS:  Clear to auscultation bilaterally.  CARDIOVASCULAR:  Regular rate and rhythm.  ABDOMEN:  Soft and obese.  It is nontender in right upper quadrant.  EXTREMITIES:  Warm and well perfused.   LABORATORY AND X-RAY DATA:  The patient had an ultrasound showing her to  have an impacted gallstone in the neck with no dilation of the bile ducts  and no evidence of cholecystitis.   Laboratory data shows white blood cell count 11.7, neutrophils 69%.  Urine  is  negative.  Lipase 20.  Bilirubin is 0.4, alkaline phosphatase 79.   IMPRESSION:  The patient has symptomatic cholelithiasis with stone impacted  in the gallbladder neck.  At this point, cholecystectomy is recommended.  I  have discussed this with the patient in detail.  She desires to have this  done as an outpatient and requests discharge.  I think this is reasonable.  I have given her a prescription for Tylox for  pain as well as Cipro 500 mg p.o. b.i.d.  She will call my office later this  morning and set up appointment to see me as soon as possible so we can  arrange surgery as an outpatient.  I also discussed cholecystectomy in  detail with the patient.  Douglas A. Magnus Ivan, M.D.    DAB/MEDQ  D:  01/23/2002  T:  01/28/2002  Job:  [id number]

## 2010-11-10 NOTE — Consult Note (Signed)
NAME:  Beth [name], Beth [name]                         ACCOUNT NO.:  [id number]   MEDICAL RECORD NO.:  [id number]                   PATIENT TYPE:  OUT   LOCATION:  GYN                                  FACILITY:  [facility]   PHYSICIAN:  De Blanch, M.D.         DATE OF BIRTH:  24-Feb-1966   DATE OF CONSULTATION:  DATE OF DISCHARGE:                                   CONSULTATION   GYNECOLOGIC/ONCOLOGY PROGRAM NOTE:  A 45 year old African American female  seen in consultation at the request of Dr. Teodora Medici regarding management  of focal complex hyperplasia with associated focal atypical complex  hyperplasia identified on D&C performed October 19, 2002.  The evaluation was  prompted by an ultrasound which showed an endometrial stripe measuring 9.8  mm and an endometrial biopsy showing complex hyperplasia.  The patient is  not having any bleeding at the present time.  The patient admits to a long  history of oligomenorrhea.  Her last menstrual period before March 2004 was  in September 2003.  She apparently was treated with Provera for a short  course by Dr. Chevis Pretty and had a withdrawal bleed at that time.   GYNECOLOGIC HISTORY:  The patient denies any gynecologic history.   OBSTETRIC HISTORY:  Gravida 2.  The patient did use oral contraceptives for  a period of time a number of years ago.  She is not using anything for  contraception at the present time.   PAST MEDICAL HISTORY:  Medical illnesses:  1. Obesity.  2. Hypertension.   PAST SURGICAL HISTORY:  1. Cholecystectomy (laparoscopic).  2. D&C.   ALLERGIES:  Drug allergies, none.   CURRENT MEDICATIONS:  An antihypertensive.   FAMILY HISTORY:  Negative for gynecologic, breast or colon cancer.   SOCIAL HISTORY:  The patient is married.  She works as a Firefighter.   REVIEW OF SYSTEMS:  Essentially negative.   PHYSICAL EXAMINATION:  VITAL SIGNS:  Height 5 foot 6 inches, weight 320  pounds.  GENERAL:  The patient is a pleasant,  obese, black female in no acute  distress.  HEENT:  Negative.  NECK:  Supple without thyromegaly.  LYMPHATICS:  There is no supraclavicular or inguinal adenopathy.  ABDOMEN:  Obese, soft, nontender.  No mass, organomegaly, ascites or hernias  noted.  PELVIC:  EG, BUS, vagina, bladder, urethra are normal.  Cervix is normal.  Uterus is difficult to outline due to the patient's obesity.  I do not feel  any masses or nodularity and do not elicit any tenderness.   IMPRESSION:  Simple and focal complex hyperplasia with focal atypical  complex hyperplasia on dilation and curettage.  The patient clearly is  oligomenorrheic and I believe that this is the etiology of the unopposed  estrogen secondary to obesity and oligomenorrhea as the etiology for her  hyperplasia.  I would recommend a medical management course rather then  proceeding to a hysterectomy at this juncture.  I think the risk of her  developing endometrial cancer is low and therefore would suggest she be  treated with Provera 10 mg a day for the first 15 days of each month.  We  will cycle her for three months in a row and have her return to see Dr.  Chevis Pretty in early September.  At that juncture I would suggest she have a  repeat endometrial biopsy to reassess her endometrium.  Depending on the  biopsy results at that time we can develop further management options  although if we can resolve her hyperplasia I think she will need to be on  some cyclic progestin but possibly alternating every other month.                                                De Blanch, M.D.    DC/MEDQ  D:  11/24/2002  T:  11/24/2002  Job:  [id number]   cc:   Leatha Gilding. Mezer, M.D.  [address]  [place]  [place] [address]  Fax: [phone]   Telford Nab, R.N.   [facility]

## 2010-11-10 NOTE — H&P (Signed)
NAMEABRAHAM, MARGULIES               ACCOUNT NO.:  [id number]   MEDICAL RECORD NO.:  [id number]          PATIENT TYPE:  OBV   LOCATION:  0463                         FACILITY:  [facility]   PHYSICIAN:  Currie Paris, M.D.DATE OF BIRTH:  [birth year]   DATE OF ADMISSION:  05/27/2004  DATE OF DISCHARGE:                                HISTORY & PHYSICAL   CHIEF COMPLAINT:  Postoperative nausea, chill, and abdominal pain.   HISTORY OF PRESENT ILLNESS:  [name] presented a couple of days ago with  apparent incarcerated umbilical hernia which was repaired urgently by Dr.  Adolph Pollack and she went home later the same day.  Today, she called  with increasing problems with nausea, some pain particularly around the  incision but throughout her abdomen and one chill that she had prior to  calling me.  During the stay in the emergency room, while she was being  evaluated, she has continued to have some pain, has not gotten worse, and  actually she feeling a little bit better but has had some pain medications.   PAST MEDICAL HISTORY:  Noted in her prior note, but basically she has been  generally good health.  She does smoke about one pack per day.  Does not  drink alcohol.  She does have a history of depression.   ALLERGIES:  She has no known drug allergies.   MEDICATIONS:  She takes Lexapro and Wellbutrin.   PHYSICAL EXAMINATION:  GENERAL:  The patient is alert, awake, and in no  acute distress but does appear somewhat uncomfortable.  VITAL SIGNS:  Blood pressure is 12/74, respirations 20, pulse 83,  temperature 99.  HEENT:  Her head is normocephalic.  Eyes are nonicteric.  Pupils are equal,  round, and regular.  Moist mucus membranes.  NECK:  Supple.  There are no masses or thyromegaly.  LUNGS:  Normal respirations.  Clear to auscultation.  HEART:  Regular rhythm.  No murmurs, gallops, or rubs.  ABDOMEN:  She is obese.  She is tender particularly around her incision but  there is  no erythema and no swelling.  No drainage.  No odor.  I did remove  the Steri-Strips to look but this does not look fluctuant today.  The rest  of her abdomen ha a little bit of tenderness but she has normal bowel  sounds.  There i no rebound or guarding.  EXTREMITIES:  No clubbing, cyanosis, or edema.   LABORATORY DATA:  White count of 17,000.  Because of a concern over the  possible missed small bowel injury when her hernia was reduced, I proceeded  to obtain a CT scan of the abdomen which I have reviewed with the  radiologist.  There is no evidence of any intra-abdominal process.  There  was a tiny bit of free fluid in the pelvis.  Contrast goes all the way  through into her colon.  There were no dilated loops of small bowel and none  appeared to be edematous or thickened to suggest an area of ischemia. There  are some what appear to be postsurgical  changes, a little bit of fluid  around the umbilical area, perhaps a little air that has been trapped there  as well.  This appears very localized.  Did not have really have the  appearance of an infection and radiologist felt this was postsurgical  changes.   IMPRESSION:  1.  Some abdominal pain and nausea, status post umbilical hernia repair of      uncertain etiology  2.  Possible wound infection developing.   PLAN:  I am going to admit her.  Because of her elevated white count, I will  empirically put her on some Unasyn as there is mesh in there.  Some warm  compresses to the umbilical area, some pain control, and restart her usual  home medications.  Reevaluate in the morning to see if this has proceeded to  become more obvious as a wound infection or whether the situation is simply  improving.     Chri   CJS/MEDQ  D:  05/27/2004  T:  05/27/2004  Job:  [id number]

## 2010-11-10 NOTE — Op Note (Signed)
NAMEMARVELENE, [name]                           ACCOUNT NO.:  [id number]   MEDICAL RECORD NO.:  [id number]                   PATIENT TYPE:  OIB   LOCATION:  5703                                 FACILITY:  [facility]   PHYSICIAN:  Douglas A. Magnus Ivan, M.D.           DATE OF BIRTH:  08/26/1965   DATE OF PROCEDURE:  01/27/2002  DATE OF DISCHARGE:  01/28/2002                                 OPERATIVE REPORT   PREOPERATIVE DIAGNOSIS:  Cholelithiasis and cholecystitis.   POSTOPERATIVE DIAGNOSIS:  Cholelithiasis and cholecystitis.   OPERATION:  Laparoscopic cholecystectomy.   SURGEON:  Douglas A. Magnus Ivan, M.D.   ASSISTANT:  Gita Kudo, M.D.   ANESTHESIA:  General endotracheal   ESTIMATED BLOOD LOSS:  Minimal   FINDINGS:  The patient was found to have an acutely inflamed gallbladder  with large stones.   DESCRIPTION OF PROCEDURE:  The patient was brought to the operating room and  identified as Beth [name].  She was placed supine on the operating table  and general anesthesia was induced.  Her abdominal was then prepped and  draped in the usual sterile fashion.  Using a #15 blade a small transverse  incision was made below the umbilicus.  The incision was carried down to the  fascia which was then opened with a scalpel.  Hemostats were then used to  pass to the peritoneal cavity.  A 0 Vicryl pursestring suture was then  placed around the fascia opening.  The Hasson port was placed through the  opening and insufflation of the abdomen was begun.  An 11 mm port was then  placed in the patient's epigastrium and two 5 mm ports were placed in the  patient's right flank under direct vision.  The gallbladder was identified  and found to be acutely inflamed and mildly distended and containing large  stones.  It was grasped and retracted above the liver bed.  Dissection was  then carried at the base of the gallbladder.  The cystic duct and artery  were then identified and clipped several  times proximally and distally  before being transected with the scissors.  The gallbladder was then slowly  dissected free from the liver bed with the electrocautery.  Once the  gallbladder was excised from the liver bed it was placed in an endosac.  Hemostasis was then achieved in the liver bed with the electrocautery.  The  abdomen was then copiously irrigated with normal saline.  The gallbladder  was then removed through the incision at the umbilicus.  The 0 Vicryl  at  the umbilicus was placed closing the fascial defect.  A separate figure-of-  eight 0 Vicryl was also placed through the fascial defect.  All ports were  then removed under direct vision.  The abdomen was deflated.  All incisions  were then anesthetized with 0.25% Marcaine and then closed with 4-0 Vicryl  subcuticular sutures.  Steri-Strips, gauze and  tape were then  applied.  The patient tolerated the procedure well.  All sponge, needle and  instrument counts were correct at the end of the procedure.  The patient was  then extubated in the operating room and taken in stable condition to the  recovery room.                                                 Douglas A. Magnus Ivan, M.D.    DAB/MEDQ  D:  01/27/2002  T:  01/30/2002  Job:  [id number]

## 2010-11-10 NOTE — Discharge Summary (Signed)
Beth [name], Beth [name]               ACCOUNT NO.:  [id number]   MEDICAL RECORD NO.:  [id number]          PATIENT TYPE:  INP   LOCATION:  5708                         FACILITY:  [facility]   PHYSICIAN:  Suzanna Obey, M.D.       DATE OF BIRTH:  03/10/1966   DATE OF ADMISSION:  08/31/2006  DATE OF DISCHARGE:  09/03/2006                               DISCHARGE SUMMARY   ADMISSION DIAGNOSIS:  Epistaxis.   DISCHARGE DIAGNOSIS:  Epistaxis.   SURGICAL PROCEDURES:  Nasal endoscopy, with anterior-posterior pack.   This is a 45 year old who has had a history of epistaxis for about 3  days.  Was seen in the [facility] emergency room several times.  He had  bleeding that was, according to the family, excessive.  No history of  previous epistaxis.  Her hemoglobin was 9 this morning and currently at  admission is 8.5.  She does not take any blood thinners. Does have  hypertension, but apparently is usually controlled.  Because of the  excessive bleeding, she was taken to the operating room.   HOSPITAL COURSE:  The patient was taken to the operating room, underwent  packing, and had a pulmonary consult because of suspected or worrisome  for blood aspiration secondary to the bleeding during intubation  process.  She actually did reasonably well and was off the ventilator on  postop day #1.  Her hemoglobin was 8 on subsequent following day's  testing.  She did not have any bleeding.  She was transferred to the  floor and continued on Staphylococcus-covering antibiotics.  On the  second postop day, she had a hemoglobin of 6.5.  She was feeling tired  and weak.  She did not have any bleeding, and no evidence of bleeding  from her nasopharynx.  I did discuss the transfusion and 6.5, and she  felt like she wanted to proceed, as she was feeling so weak and, if she  did bleed again going home there would be no cushion on the level of  blood loss that would be necessary for her to get in serious trouble.  She was  transfused.  She had agreed to this.  She was given 1 unit.  She  had a hemoglobin of 8 the following day.  Still no bleeding since the  admission date.  Packing was still in position.  She was discharged with  the packing and antibiotics to cover Staphylococcus, which was Keflex  500 t.i.d.  She is to follow up in a few days to have the packing  removed.  She is to follow up if she has any further issues or problems,  including obviously bleeding.           ______________________________  Suzanna Obey, M.D.     JB/MEDQ  D:  10/30/2006  T:  10/31/2006  Job:  [id number]

## 2010-11-10 NOTE — Op Note (Signed)
Beth [name], Beth [name]               ACCOUNT NO.:  [id number]   MEDICAL RECORD NO.:  [id number]          PATIENT TYPE:  INP   LOCATION:  2104                         FACILITY:  [facility]   PHYSICIAN:  Suzanna Obey, M.D.       DATE OF BIRTH:  1965/09/03   DATE OF PROCEDURE:  08/31/2006  DATE OF DISCHARGE:                               OPERATIVE REPORT   PREOPERATIVE DIAGNOSIS:  Epistaxis, right.   POSTOPERATIVE DIAGNOSIS:  Epistaxis, right.   SURGICAL PROCEDURE:  Nasal endoscopy with anterior/posterior nasal pack.   ANESTHESIA:  General.   ESTIMATED BLOOD LOSS:  Approximately 50 mL.   INDICATIONS:  This is a 45 year old who has had a problem with epistaxis  that has been going on for a couple of days.  She was seen 3 times in  the emergency room, a rhino rocket was placed on the second visit.  She  returned to the emergency room tonight with further significant  bleeding.  She is down to 8.5 hemoglobin.  She was offered a  conservative treatment as well as OR and she has selected the  conservative, but even before she could get out of the emergency room to  be admitted, she started bleeding profusely.  She was informed of the  risks and benefits of the procedure including bleeding, infection,  scarring, blindness, septal perforation, change in the external  appearance of the nose, sinusitis, and risks of the anesthetic.  All  questions are answered and the consent was obtained.   OPERATION:  The patient was taken to the operating room, placed in the  supine position.  After adequate general endotracheal tube anesthesia,  examined the nose.  All of the clots were suctioned out from the left  side.  There was no active bleeding from the left side.  There was one  site on the anterior-inferior turbinate that was from trauma of the  suction and it was cauterized.  The right side was examined and most all  of the blood clots and debris were in this side and they were up in the  posterior  aspect of the nose in the middle meatus area and superior  aspect of the nose.  Once all of this was cleaned out, there was no  obvious bleeding site.  There was one area on the middle turbinate that  was bleeding on the lateral aspect, which was cauterized.  There was a  large concha bullosa.  There was some blood coming from the infundibulum  area so this concha bullosa was pushed laterally and there was just a  few sites that were cauterized but no clear cut arterial bleeding.  The  very posterior aspect where the superior turbinate was located was also  just a spot here and there that was not obvious arterial active  significant bleeding.  The inferior turbinate was outfractured.  Again  with nasal endoscopy, no obvious bleeding site could be identified so a  complete anterior-posterior pack was placed with 3 yards of Adaptic  gauze soaked in bacitracin.  This was placed tightly into the nose.  The  left side was then examined with the endoscopy again and there was no  bleeding.  The  oral cavity and oropharynx was suctioned out of all blood and debris and  there was no active bleeding from the nasopharynx.  The patient was kept  intubated and paralyzed since she was a very difficult intubation to  wake up slowly.           ______________________________  Suzanna Obey, M.D.     JB/MEDQ  D:  08/31/2006  T:  09/01/2006  Job:  [id number]

## 2010-11-10 NOTE — Discharge Summary (Signed)
Beth [name], Beth [name]               ACCOUNT NO.:  [id number]   MEDICAL RECORD NO.:  [id number]          PATIENT TYPE:  INP   LOCATION:  0463                         FACILITY:  [facility]   PHYSICIAN:  Adolph Pollack, M.D.DATE OF BIRTH:  04-08-1966   DATE OF ADMISSION:  05/27/2004  DATE OF DISCHARGE:  06/01/2004                                 DISCHARGE SUMMARY   PRINCIPAL DISCHARGE DIAGNOSIS:  Wound infection.   SECONDARY DIAGNOSES:  1.  Depression.  2.  Morbid obesity   PROCEDURE:  Drainage of wounds.   REASON FOR ADMISSION:  Beth [name] is a 45 year old female who underwent  repair of a ventral incisional hernia in the subumbilical region with mesh  on 05/25/2004.  She was sent home as an outpatient.  She talked with Dr.  Jamey Ripa on 05/27/2004 with increasing problems with nausea, pain around the  incision, and 1 chill.  She was seen in the emergency department by him and  was noted to have a white count of 72,000.  There is a concern about some  tenderness around her incision and possible early infection.  She  subsequently was admitted.   HOSPITAL COURSE:  She was admitted and placed on IV antibiotics.  She began  to having spontaneous drainage of the wound and Dr. Jamey Ripa opened the wound  sterilely and sent it for wound culture which grew out a few gram positive  cocci; will continue to order antibiotics to vancomycin given the fact that  in 2003 she had an MRSA infection of the groin and she could well be  colonized.   She began on dressing changes.  The wound cleaned up fairly nicely.  There  is a 5-to-6 cm deep tract.  She defervesced, was feeling much better,  tolerating a diet, and white count returned to normal.  By 06/01/2004 I felt  that she was ready for discharge home on oral antibiotics.   DISPOSITION:  Discharged home on 06/01/2004.  Will arrange for home health  nursing to do twice a day dressing changes.  She is to be discharged on all  of her home medicines  and doxycycline 100 mg p.o. b.i.d.  Will keep this  going for probably about 6 weeks.  We will continue her activity  restrictions. I plan to see her back in the office in 1 week.     Tish Men  D:  06/01/2004  T:  06/02/2004  Job:  [id number]

## 2010-11-10 NOTE — Op Note (Signed)
[name], [name]               ACCOUNT NO.:  [id number]   MEDICAL RECORD NO.:  [id number]          PATIENT TYPE:  AMB   LOCATION:  DAY                          FACILITY:  [facility]   PHYSICIAN:  Adolph Pollack, M.D.DATE OF BIRTH:  1966/03/21   DATE OF PROCEDURE:  05/25/2004  DATE OF DISCHARGE:                                 OPERATIVE REPORT   PREOPERATIVE DIAGNOSIS:  Ventral incisional hernia.   POSTOPERATIVE DIAGNOSIS:  Ventral incisional hernia.   PROCEDURE:  Repair of ventral incisional hernia with polypropylene mesh.   SURGEON:  Adolph Pollack, M.D.   ANESTHESIA:  General.   INDICATIONS FOR PROCEDURE:  Beth [name] is a 45 year old female who had a  laparoscopic cholecystectomy in 2003.  She was having significant  periumbilical pain in the emergency department and was found to have a  periumbilical hernia where the previous incision was.  It is somewhat  reducible in the supine position.  She now presents for repair.  The  procedure and the risks were discussed with her preoperatively.   DESCRIPTION OF PROCEDURE:  She was seen in the holding area and then brought  to the operating room, placed on the operating table, and the general  anesthetic was administered.  Her periumbilical area was sterilely prepped  and draped.   The hernia was easily reducible.  A transverse subumbilical incision was  made through the skin and subcutaneous tissue all the way down to the  fascia.  Periumbilical dissection was done bluntly.  The umbilicus was  dissected free from the fascia, exposing the hernia sac which was opened and  omental contents reduced.  Following this, I exposed the fascia around the  defect using electrocautery and dissecting fibrofatty tissue away from it.  I then primarily closed the fascial defect with interrupted 0 Surgilon  sutures.  An onlay piece of polypropylene mesh was then placed over the  primary repair, and the primary repair sutures were threaded  up through it  and tied down, anchoring the mesh directly over the primary repair.  The  periphery of the mesh was then anchored to the surrounding fascia with a  running 2-0 Prolene suture for an overlap distance of 3 to 4 cm in all  directions.  There was some laxity in the mesh.   I then injected the fascia with 0.5% plain Marcaine in the subcutaneous  tissue for regional block-type effect.  Following this, the umbilicus was  reapplied to the mesh with a single 3-0 Vicryl suture.  The subcutaneous  tissue was then closed in two layers with running 3-0 Vicryl suture.  The  skin was closed with a 4-0 Monocryl subcuticular suture followed by Steri-  Strips and sterile dressing.   She tolerated the procedure without apparent complications and was taken to  the recovery room in satisfactory condition.     Beth [name]  D:  05/25/2004  T:  05/25/2004  Job:  [id number]   cc:   Delano Metz, M.D.  [address]  [place]  [place] [address]  Fax: [phone]

## 2010-11-10 NOTE — Op Note (Signed)
   NAME:  Beth [name], Beth [name]                         ACCOUNT NO.:  [id number]   MEDICAL RECORD NO.:  [id number]                   PATIENT TYPE:  AMB   LOCATION:  DAY                                  FACILITY:  [facility]   PHYSICIAN:  Howard C. Mezer, M.D.               DATE OF BIRTH:  [birth year]   DATE OF PROCEDURE:  10/19/2002  DATE OF DISCHARGE:                                 OPERATIVE REPORT   PREOPERATIVE DIAGNOSIS:  Complex endometrial hyperplasia.   POSTOPERATIVE DIAGNOSIS:  Complex endometrial hyperplasia.  Pending  pathology.   OPERATION PERFORMED:  Hysteroscopy D&C.   ANESTHESIA:  General endotracheal.   PREPARATION:  Betadine.   DESCRIPTION OF PROCEDURE:  With the patient in lithotomy position, she was  prepped and draped in a routine fashion. The uterus was sounded to  approximately 4 inches and unfortunately there were no sounds available  measured in centimeters. Previous endometrial biopsy in the office revealed  the uterine cavity to measure at 9 1/2 cm. The cervix was very easily  dilated. The hysteroscope was introduced and Hyskon was used as a distention  medium. Although the visualization was not outstanding, it was adequate to  evaluate the endometrial cavity and no significant polypoid or abnormal  structures were noted. Both tubal ostia were identified. The hysteroscope  was removed and the cavity curetted productive of a moderate amount of  endometrial tissue. This was predominantly from the posterior wall. Randall  stone forceps were used and no further tissue was identified. At the  completion of the procedure, there was minimal bleeding.  The estimated  blood loss was less than 25 mL. The sponge, needle and instrument counts  were correct. The patient tolerated the procedure well and was taken to the  recovery room in satisfactory condition.                                                Leatha Gilding. Mezer, M.D.    HCM/MEDQ  D:  10/19/2002  T:  10/19/2002   Job:  [id number]   cc:   [facility]

## 2010-11-13 ENCOUNTER — Ambulatory Visit: Payer: Self-pay | Admitting: Physical Therapy

## 2010-11-15 ENCOUNTER — Ambulatory Visit: Payer: Self-pay | Admitting: Physical Therapy

## 2010-11-21 ENCOUNTER — Ambulatory Visit: Payer: Self-pay | Admitting: Physical Therapy

## 2010-11-24 ENCOUNTER — Encounter: Payer: Self-pay | Admitting: Physical Therapy

## 2010-11-28 ENCOUNTER — Encounter: Payer: Self-pay | Admitting: Physical Therapy

## 2010-11-29 ENCOUNTER — Encounter: Payer: Self-pay | Admitting: Physical Therapy

## 2010-12-01 ENCOUNTER — Encounter: Payer: Self-pay | Admitting: Physical Therapy

## 2011-02-10 IMAGING — CR DG CHEST 2V
2 series · 2 of 2 positions shown · non-contrast
Comparison: Chest 09/02/2006.

CLINICAL DATA: Shortness of breath.  Cough.

CHEST - 2 VIEW

[w chest pa]
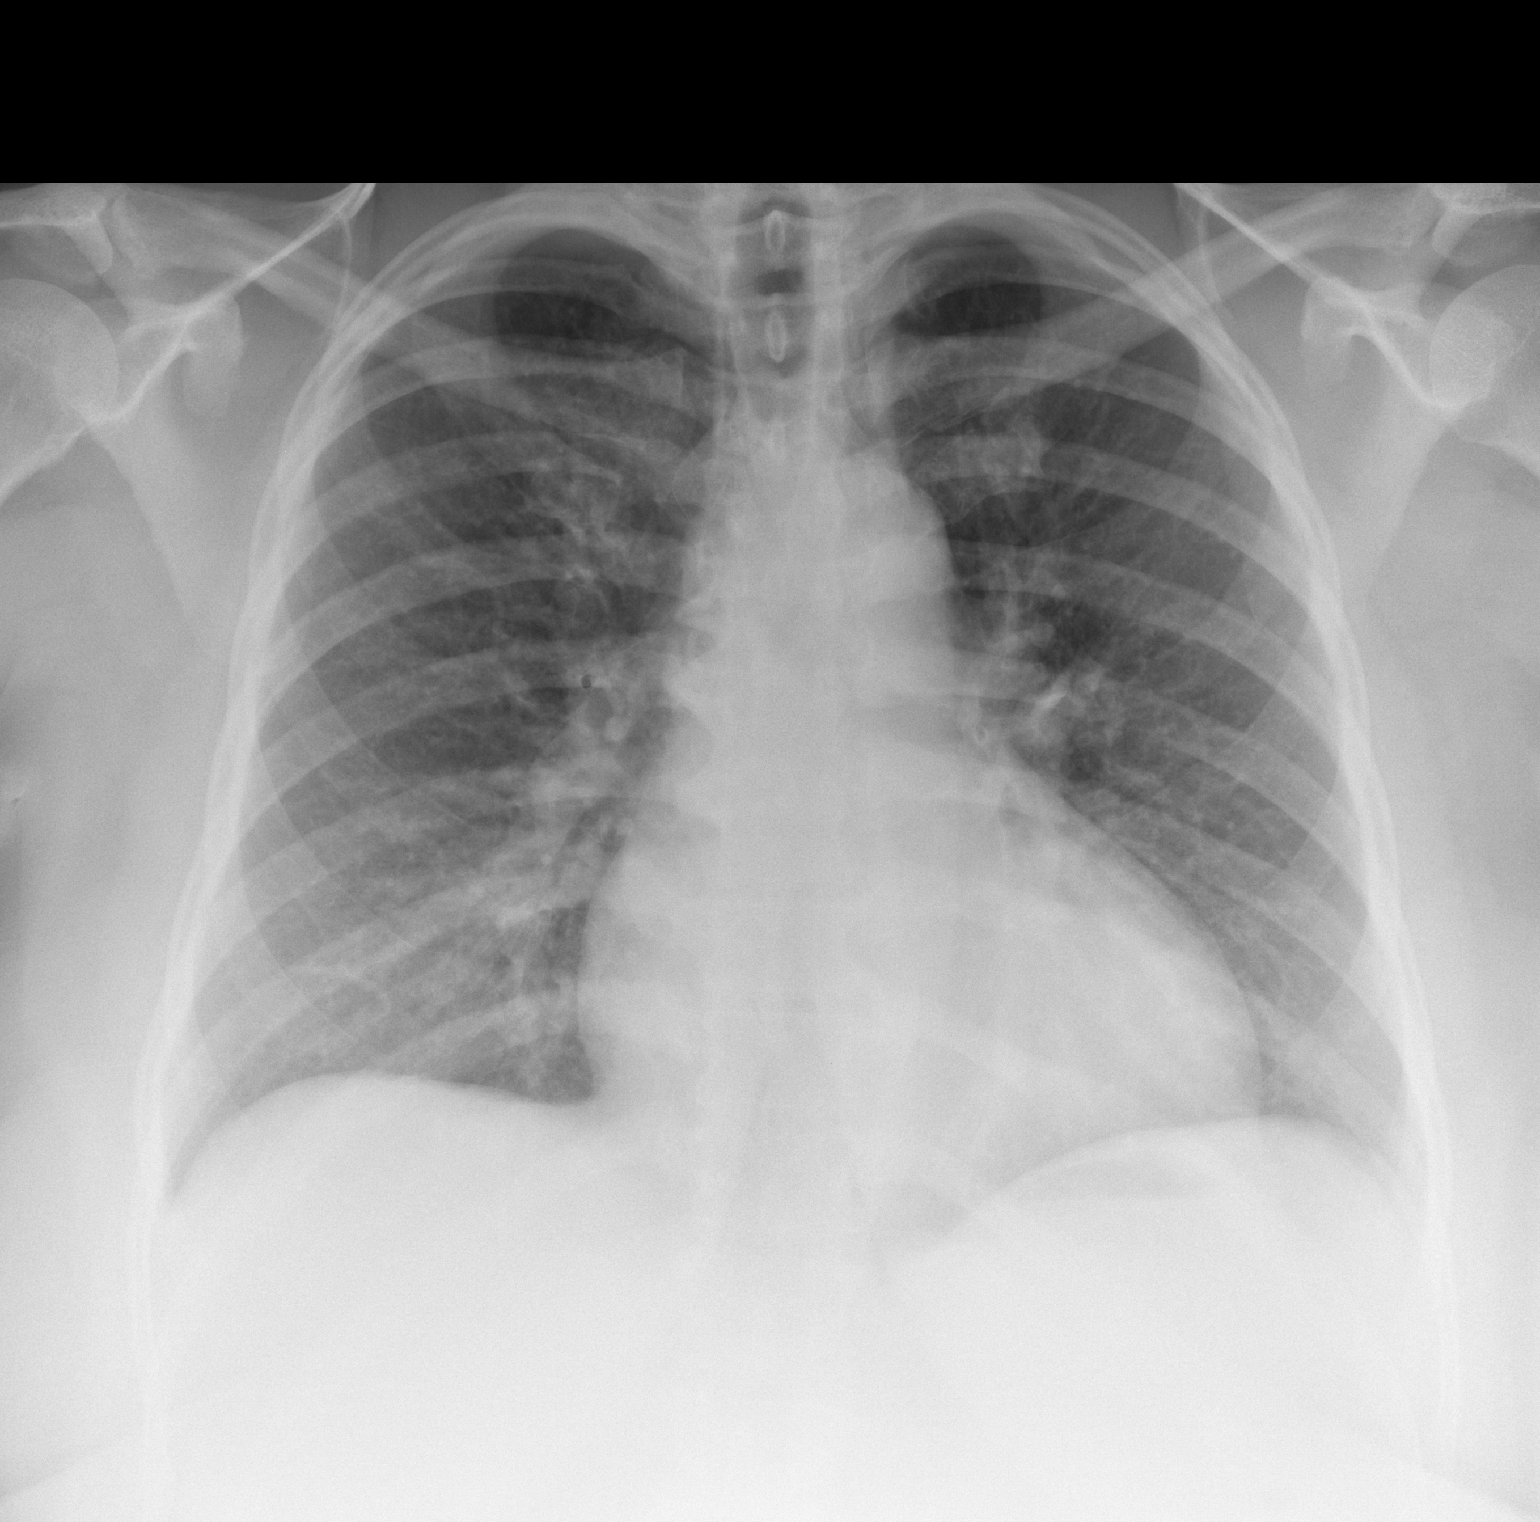

[w chest lat]
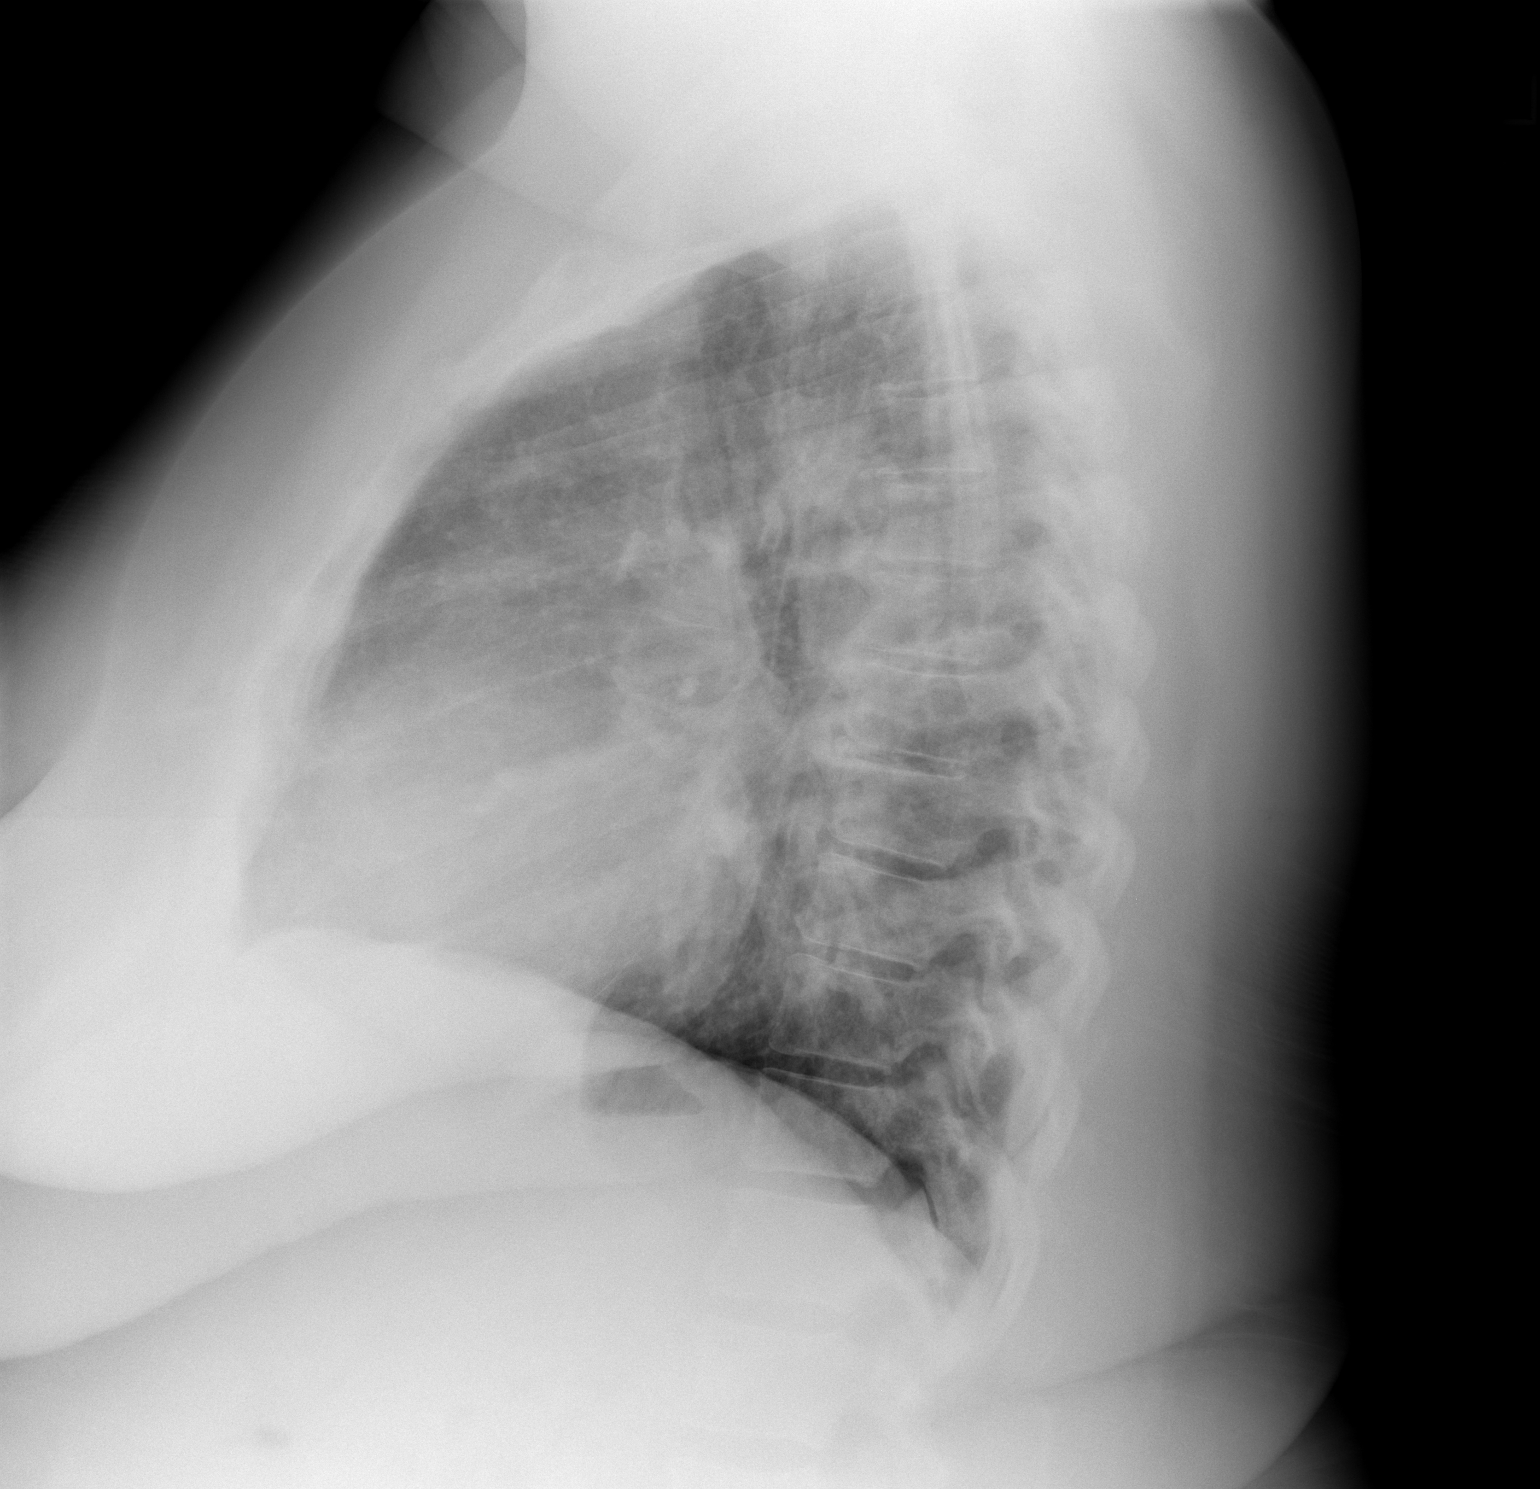

[2 of 2 positions shown; findings below may reference images not displayed]

FINDINGS: There is cardiomegaly and pulmonary vascular congestion
without frank edema.  No focal airspace disease or effusion.
IMPRESSION: Cardiomegaly and pulmonary vascular congestion.

## 2011-03-26 LAB — POCT I-STAT, CHEM 8
BUN: 9
Calcium, Ion: 1.16
HCT: 40
Hemoglobin: 13.6
TCO2: 29

## 2011-03-26 LAB — DIFFERENTIAL
Basophils Absolute: 0.1
Basophils Relative: 1
Eosinophils Absolute: 0.2
Eosinophils Relative: 2
Monocytes Absolute: 0.8

## 2011-03-26 LAB — CBC
HCT: 38.5
Hemoglobin: 12.8
MCHC: 33.3
MCV: 86.5
Platelets: 329
RDW: 16.2 — ABNORMAL HIGH

## 2011-03-26 LAB — URINALYSIS, ROUTINE W REFLEX MICROSCOPIC
Bilirubin Urine: NEGATIVE
Glucose, UA: NEGATIVE
Ketones, ur: NEGATIVE
Protein, ur: NEGATIVE
pH: 6.5

## 2011-04-10 LAB — DIFFERENTIAL
Basophils Relative: 0
Lymphocytes Relative: 19
Monocytes Absolute: 0.7
Monocytes Relative: 8
Neutro Abs: 6.8

## 2011-04-10 LAB — COMPREHENSIVE METABOLIC PANEL
Albumin: 3.4 — ABNORMAL LOW
Alkaline Phosphatase: 79
BUN: 8
GFR calc Af Amer: >60
Potassium: 3.8
Total Protein: 7.8

## 2011-04-10 LAB — CBC
HCT: 34.9 — ABNORMAL LOW
Platelets: 409 — ABNORMAL HIGH
RDW: 18 — ABNORMAL HIGH

## 2011-04-10 LAB — ANA: Anti Nuclear Antibody(ANA): NEGATIVE

## 2011-04-15 ENCOUNTER — Observation Stay (HOSPITAL_COMMUNITY)
Admission: EM | Admit: 2011-04-15 | Discharge: 2011-04-16 | Disposition: A | Discharge status: Home / Self Care | Payer: Self-pay | Attending: Internal Medicine | Admitting: Internal Medicine

## 2011-04-15 ENCOUNTER — Emergency Department (HOSPITAL_COMMUNITY): Payer: Self-pay

## 2011-04-15 DIAGNOSIS — L80 Vitiligo: Secondary | ICD-10-CM | POA: Insufficient documentation

## 2011-04-15 DIAGNOSIS — Z23 Encounter for immunization: Secondary | ICD-10-CM | POA: Insufficient documentation

## 2011-04-15 DIAGNOSIS — E119 Type 2 diabetes mellitus without complications: Secondary | ICD-10-CM | POA: Insufficient documentation

## 2011-04-15 DIAGNOSIS — F329 Major depressive disorder, single episode, unspecified: Secondary | ICD-10-CM | POA: Insufficient documentation

## 2011-04-15 DIAGNOSIS — I10 Essential (primary) hypertension: Secondary | ICD-10-CM | POA: Insufficient documentation

## 2011-04-15 DIAGNOSIS — F3289 Other specified depressive episodes: Secondary | ICD-10-CM | POA: Insufficient documentation

## 2011-04-15 DIAGNOSIS — J45901 Unspecified asthma with (acute) exacerbation: Principal | ICD-10-CM | POA: Insufficient documentation

## 2011-04-15 LAB — GLUCOSE, CAPILLARY
Glucose-Capillary: 217 mg/dL — ABNORMAL HIGH (ref 70–99)
Glucose-Capillary: 167 mg/dL — ABNORMAL HIGH (ref 70–99)

## 2011-04-15 LAB — CBC
HCT: 36.1 % (ref 36.0–46.0)
Hemoglobin: 12.1 g/dL (ref 12.0–15.0)
MCH: 29.1 pg (ref 26.0–34.0)
MCHC: 33.5 g/dL (ref 30.0–36.0)
MCV: 86.8 fL (ref 78.0–100.0)

## 2011-04-15 LAB — DIFFERENTIAL
Lymphocytes Relative: 11 % — ABNORMAL LOW (ref 12–46)
Monocytes Absolute: 0.2 10*3/uL (ref 0.1–1.0)
Monocytes Relative: 2 % — ABNORMAL LOW (ref 3–12)
Neutro Abs: 10.5 10*3/uL — ABNORMAL HIGH (ref 1.7–7.7)

## 2011-04-15 LAB — COMPREHENSIVE METABOLIC PANEL
Alkaline Phosphatase: 90 U/L (ref 39–117)
BUN: 9 mg/dL (ref 6–23)
Calcium: 9.8 mg/dL (ref 8.4–10.5)
GFR calc Af Amer: >90 mL/min (ref >90)
Glucose, Bld: 160 mg/dL — ABNORMAL HIGH (ref 70–99)
Total Protein: 8 g/dL (ref 6.0–8.3)

## 2011-04-15 LAB — ANGIOTENSIN CONVERTING ENZYME: Angiotensin-Converting Enzyme: 16 U/L (ref 8–52)

## 2011-04-16 LAB — CBC
MCH: 28.8 pg (ref 26.0–34.0)
MCHC: 32.9 g/dL (ref 30.0–36.0)
Platelets: 322 10*3/uL (ref 150–400)
RBC: 4.34 MIL/uL (ref 3.87–5.11)

## 2011-04-16 LAB — GLUCOSE, CAPILLARY
Glucose-Capillary: 181 mg/dL — ABNORMAL HIGH (ref 70–99)
Glucose-Capillary: 200 mg/dL — ABNORMAL HIGH (ref 70–99)

## 2011-04-18 NOTE — Discharge Summary (Signed)
  Beth [name], Beth [name]               ACCOUNT NO.:  [id number]  MEDICAL RECORD NO.:  [id number]  LOCATION:  4704                         FACILITY:  [facility]  PHYSICIAN:  Wilson Singer, M.D.DATE OF BIRTH:  1966/06/16  DATE OF ADMISSION:  04/15/2011 DATE OF DISCHARGE:  04/16/2011                              DISCHARGE SUMMARY   FINAL DISCHARGE DIAGNOSES: 1. Exacerbation of asthma. 2. Morbid obesity. 3. Hypertension. 4. Type 2 diabetes mellitus. 5. Depression. 6. Vitiligo.  CONDITION ON DISCHARGE:  Stable and improved.  MEDICATIONS ON DISCHARGE: 1. Lisinopril increased to 20 mg daily. 2. Prednisone tapering fashion 40 mg daily for 3 days, 20 mg daily for     3 days, 10 mg daily for 3 days, and then stop. 3. Advair Diskus 250/50 one puff twice a day. 4. Combivent inhaler 1 puff twice a day. 5. Albuterol nebulizer every 6 hours p.r.n. 6. Lipitor 20 mg daily. 7. Metformin 500 mg b.i.d. 8. Neurontin 600 mg t.i.d. 9. Protonix 40 mg daily. 10.Risperdal 1 mg at bedtime. 11.Toprol-XL 100 mg daily. 12.Wellbutrin SR 150 mg b.i.d. 13.Zoloft 100 mg b.i.d.  HISTORY:  This very pleasant 45 year old lady was admitted yesterday with symptoms of dyspnea.  Please see initial history and physical examination done by Dr. Mahala Menghini.  HOSPITAL PROGRESS:  The patient was appropriately started on intravenous steroids.  There was no evidence of any infection.  She has improved significantly since yesterday and her wheezing is much improved.  She is able to take oral steroids and saturating reasonably well on very minimal oxygen.  PHYSICAL EXAMINATION:  VITAL SIGNS:  Today, she is afebrile with temperature 97.8, blood pressure 171/66, this is one reading, other readings were 119/73, pulse rate 84, saturation 93% on 2 L of oxygen. GENERAL:  She does not have increased work of breathing.  There is no peripheral central cyanosis. HEART:  Sounds are present and normal without murmurs. LUNGS:  Lung  fields show some scattered wheezing, but her chest is not tight. NEUROLOGIC:  She is alert and oriented without any focal neurologic signs.  INVESTIGATIONS:  Hemoglobin of 12.5, white blood cell count 18.6, which is likely related to steroids, platelets 222.  Her BNP was normal at 14.5.  Electrolytes were in the normal range and no evidence of renal dysfunction.  CBGs have been elevated to 200s since she has been on steroids and I expect this will continue until steroids are discontinued.  DISPOSITION:  The patient is stable to be discharged home, but she must have a tapering dose of steroids, which I have prescribed.  We discussed at length about her morbid obesity.  She clearly needs to lose at least 100 pounds and we discussed the low glycemic index routes and she would try to follow this route.  She also has been advised to see her primary care physician at [facility] within the next week or two for followup.     Wilson Singer, M.D.     NCG/MEDQ  D:  04/16/2011  T:  04/16/2011  Job:  [id number]  Electronically Signed by Lilly Cove M.D. on 04/18/2011 08:53:48 AM

## 2011-04-20 NOTE — H&P (Signed)
NAMEDONTAVIA, BRAND               ACCOUNT NO.:  [id number]  MEDICAL RECORD NO.:  [id number]  LOCATION:  [facility]                         FACILITY:  [facility]  PHYSICIAN:  Pleas Koch, MD        DATE OF BIRTH:  Mar 16, 1966  DATE OF ADMISSION:  04/15/2011 DATE OF DISCHARGE:                             HISTORY & PHYSICAL   CHIEF COMPLAINT:  Shortness of breath.  This pleasant 45 year old female presented to [facility] Emergency Department with increasing shortness of breath over the past week.  She states that the shortness of breath started on its own without any exposure to anyone who is sick.  The patient carries a history of seasonal allergies and states that she started having cough and wheezing.  This progressed despite use of albuterol inhalers with a spacer and despite the use of her nebulizing treatments at home.  She is very compliant per her report with her medications and uses Advair 1 daily as well as albuterol medicines.  She states that she has had no fever.  She has had no productive sputum.  She has had no myalgias or chills.  She has had no chest pain.  She has had increasing shortness of breath.  At baseline, the patient can ambulate from her bed to the bathroom and can walk for about 5 minutes without getting winded but she has been able to do that with great difficulty.  The reason for her coming to the emergency room was that she could not sleep last night and was up the whole night because she was short of breath.  The patient states that she has had some mild headaches and does have history of sinus issues and air issues and an upper respiratory issues but has no other significant complaints.  She lives currently at home by herself.  She is accompanied by her nephew who is here.  The rest of review of systems, the patient denies any dark stools, any tarry stools, any nausea, any vomiting, any weakness, any one-sided body, any blurred vision, any double vision.  PAST  MEDICAL HISTORY:  Significant for the following. 1. She has history of hypertension.  She has history of depression.     She has history of vitiligo.  She has history of hyperglycemia -     she has been told she is diabetic and states that she is also on     metformin.  She also has diabetic neuropathy in her lower     extremities and is on Neurontin for the same.  She has a history of     bipolar/depression and currently on multiple medications for the     same.  She follows with [facility] and Dr. Lendell Caprice has seen her     in the past month.  She does not know if she has any history of CHF and was told this by the emergency room on her last visit here in 2011.  She has never had a cath or an echocardiogram to determine this.  PAST SURGICAL HISTORY:  Extensive and includes the following: 1. She had epistaxis in 2008 and actually had posterior nasal packing     and  was intubated for this for short period of time.  She has     complex endometrial hyperplasia that was followed with Dr. Chevis Pretty of     OB/GYN.  The endometrial pathology showed negative for     intraepithelial malignancy or hyperplasia.  She has had     cholecystitis and cholelithiasis status post cholecystectomy in     2003.  She has also had ventral hernia surgery in the past by Dr.     Abbey Chatters.  She has a history of possible vitiligo, however, on the pathology that was done in 2011, it was thought that this could also be discoid lupus erythematosus versus sarcoid.  CURRENT MEDICATIONS:  Incomplete but seem to include: 1. Lisinopril 40. 2. Toprol 100 XL. 3. Lipitor 40. 4. Wellbutrin 150 b.i.d. 5. Zoloft 100 daily. 6. Risperidone 2 mg. 7. Neurontin.  Her MAR will be completed when it is completed by pharmacy tech.  Please note that the patient also snores at night.  He has never been evaluated for sleep apnea.  She does not state that she awakens with any difficulty but at night but does carry a snoring  history.  FAMILY HISTORY:  Not pertinent.  ALLERGIES:  No known drug allergies.  SOCIAL HISTORY:  The patient used to smoke up to 7 years and smoked 1 pack every 2 days from the age of [age].  Her next of kin is Tanveer Dobberstein who can be reached at [phone].  PHYSICAL EXAMINATION:  GENERAL:  The patient is an morbidly obese, African American female in some respiratory distress but able to form full sentences.  She does cough significantly during exam. VITAL SIGNS:  Her blood pressure initially was 185/85, pulse of 101, respirations of 26, O2 sats were 94% and 96% even despite being off of room air.  She is morbidly obese. HEENT:  She has no pallor, no icterus.  She has no nasal polyps, however, she does have dilatation of nares.  Her left ear does show some clear fluid behind it without any evidence of otitis. NECK:  Soft, supple.  She has Mallampati stage II.  Throat moderately good dentition.  I do not appreciate any bruits, any murmurs in the neck. HEART:  S1, S2 tachycardic.  Regular rate and rhythm.  No murmurs, rubs, or gallops.  Very large and pendulous breast. LUNGS:  Wheeze all over throughout posterior aspect of the lung fields with no tactile vocal resonance and tactile vocal fremitus.  I am not able to appreciate any dullness on percussion.  ABDOMEN:  Soft, nontender, morbidly obese, and meaningful exam is limited by habitus. EXTREMITIES:  Lower extremities demonstrate hypopigmented areas halfway up the shin consistent with possible vitiligo. NEUROLOGICAL:  She is grossly intact.  She does have a nose ring.  Pertinent labs done by the ED are pending as they have only been ordered just now.  She does have actually a white count of 12.1, hemoglobin 12.1, hematocrit 36.1, platelet count 260,000 with predominant neutrophilia - I am unclear as to whether this is secondary to steroids that she has received in the emergency room.  Chest x-ray done showed peribronchial  thickening likely reflecting patient's asthma, vascular congestion, mild cardiomegaly without significant pulmonary edema.  On physician review of the films, I do not appreciate any infiltrate. There is indeed mild cardiomegaly.  An EKG that was ordered by myself today shows the following:  PR interval of 0.12.  There is some left anterior fascicular block.  She barely  meets criterion for LVH with ST __________ criterion.  There are some peaked T-waves in V3, V4, V5, with no ST-segment depression or elevation.  QRS complex is 0.08 wide compared to EKG done December 07, 2009. It does not seem very much changed except for the tachycardia that is now present which is presumably secondary to the albuterol she has received.  IMPRESSION/ASSESSMENT:  This is a pleasant 45 year female with probable asthma exacerbation likely secondary to seasonal allergies exacerbated with a history of hypertension, diabetes mellitus non-insulin dependent, obesity, vitiligo versus sarcoid, depression, and peripheral neuropathy secondary to diabetes mellitus as well as mild hypertensive urgency likely secondary to noncompliance of medications who presents with asthma exacerbation. 1. Asthma.  Medication will be nebs q.2 hourly scheduled with     albuterol for the next 12 hours and will get Atrovent nebs as well     q.6 hourly.  RT will consult to see the patient.  She is talking in     full sentences and her O2 sats despite what appears to be     transmitted upper respiratory sounds.  She does not appear unstable     and will go to telemetry floor.  We will get CBC in the morning and     likely her leukocytosis is secondary to steroids that she has been     given versus acute stress state.  We will keep her O2 sats above     90. 2. Hypertension.  The patient has not received her medications as yet     and we will administer metoprolol XL 100 stat and then q.a.m. as     well as lisinopril 20 mg stat and then q.a.m.   I questioned the use     of a beta-blocker in someone with asthma and this will have to be     carefully monitored and we would possibly taper her beta-blocker in     favor of a different type of medication. 3. Diabetes mellitus.  We will check her blood sugars q.i.d. a.c.,     h.s., hold her metformin at present time and cover her with     resistant sliding scale as she is on steroids.  She will be getting     Solu-Medrol 125 mg q.8 times today and then we will start her on     prednisone.  Please note that she has received prednisone and Solu-     Medrol in the ED and also magnesium. 4. Super morbid obesity.  The patient's BMI is a well above 45 and she     will likely need nutrition for assistance in having this looked at. 5. Question of restrictive and obstructive lung disease - the     patient's habitus may cause restrictive lung disease, but she also     does snore.  I would recommend as an outpatient follow up with     [facility] versus other pulmonologist to get PFTs and     determine best course of care.  She may benefit from outpatient     sleep study as well. 6. Skin lesions.  Vitiligo versus sarcoid.  I will get an ACE level in     the morning to determine if sarcoid is in deed a differential for     her and she will continue whatever topical cream she is on for her     vitiligo when [facility] is complete. 7. Depression.  The patient will continue her medications  as scheduled     including Wellbutrin and Risperdal. 8. Diabetic neuropathy.  The patient will continue Neurontin as     scheduled.  The patient will be will be observed by [facility] team 1 and will be seen on day-to-day basis.  I spent over 45 minutes in time discussing her care with her including coordination of care and discussion.  It was pleasure taking care of this patient.          ______________________________ Pleas Koch, MD     JS/MEDQ  D:  04/15/2011  T:  04/15/2011  Job:  [id number]  cc:    Joellyn Rued, PA-C  Electronically Signed by Pleas Koch MD on 04/20/2011 02:50:15 PM

## 2011-05-23 ENCOUNTER — Emergency Department (HOSPITAL_COMMUNITY): Payer: Self-pay

## 2011-05-23 ENCOUNTER — Inpatient Hospital Stay (HOSPITAL_COMMUNITY)
Admission: EM | Admit: 2011-05-23 | Discharge: 2011-05-28 | DRG: 202 | Disposition: A | Discharge status: Home / Self Care | Payer: Self-pay | Attending: Internal Medicine | Admitting: Internal Medicine

## 2011-05-23 ENCOUNTER — Encounter: Payer: Self-pay | Admitting: Emergency Medicine

## 2011-05-23 DIAGNOSIS — J45901 Unspecified asthma with (acute) exacerbation: Principal | ICD-10-CM | POA: Diagnosis present

## 2011-05-23 DIAGNOSIS — E785 Hyperlipidemia, unspecified: Secondary | ICD-10-CM | POA: Diagnosis present

## 2011-05-23 DIAGNOSIS — I2510 Atherosclerotic heart disease of native coronary artery without angina pectoris: Secondary | ICD-10-CM | POA: Diagnosis present

## 2011-05-23 DIAGNOSIS — F32A Depression, unspecified: Secondary | ICD-10-CM | POA: Diagnosis present

## 2011-05-23 DIAGNOSIS — Z6841 Body Mass Index (BMI) 40.0 and over, adult: Secondary | ICD-10-CM

## 2011-05-23 DIAGNOSIS — J45902 Unspecified asthma with status asthmaticus: Secondary | ICD-10-CM

## 2011-05-23 DIAGNOSIS — Z79899 Other long term (current) drug therapy: Secondary | ICD-10-CM

## 2011-05-23 DIAGNOSIS — E669 Obesity, unspecified: Secondary | ICD-10-CM | POA: Diagnosis present

## 2011-05-23 DIAGNOSIS — F329 Major depressive disorder, single episode, unspecified: Secondary | ICD-10-CM | POA: Diagnosis present

## 2011-05-23 DIAGNOSIS — E119 Type 2 diabetes mellitus without complications: Secondary | ICD-10-CM | POA: Diagnosis present

## 2011-05-23 DIAGNOSIS — G589 Mononeuropathy, unspecified: Secondary | ICD-10-CM | POA: Diagnosis present

## 2011-05-23 DIAGNOSIS — I10 Essential (primary) hypertension: Secondary | ICD-10-CM | POA: Diagnosis present

## 2011-05-23 DIAGNOSIS — M129 Arthropathy, unspecified: Secondary | ICD-10-CM | POA: Diagnosis present

## 2011-05-23 DIAGNOSIS — G629 Polyneuropathy, unspecified: Secondary | ICD-10-CM | POA: Diagnosis present

## 2011-05-23 DIAGNOSIS — F3289 Other specified depressive episodes: Secondary | ICD-10-CM | POA: Diagnosis present

## 2011-05-23 HISTORY — DX: Chronic obstructive pulmonary disease, unspecified: J44.9

## 2011-05-23 HISTORY — DX: Hyperlipidemia, unspecified: E78.5

## 2011-05-23 HISTORY — DX: Essential (primary) hypertension: I10

## 2011-05-23 HISTORY — DX: Phlebitis and thrombophlebitis of unspecified site: I80.9

## 2011-05-23 HISTORY — DX: Depression, unspecified: F32.A

## 2011-05-23 HISTORY — DX: Anxiety disorder, unspecified: F41.9

## 2011-05-23 HISTORY — DX: Other specified chronic obstructive pulmonary disease: J44.89

## 2011-05-23 HISTORY — DX: Unspecified osteoarthritis, unspecified site: M19.90

## 2011-05-23 HISTORY — DX: Major depressive disorder, single episode, unspecified: F32.9

## 2011-05-23 LAB — CBC
HCT: 38.7 % (ref 36.0–46.0)
MCH: 28.7 pg (ref 26.0–34.0)
MCHC: 32.3 g/dL (ref 30.0–36.0)
MCV: 89 fL (ref 78.0–100.0)
MCV: 88.5 fL (ref 78.0–100.0)
Platelets: 326 10*3/uL (ref 150–400)
RDW: 16.2 % — ABNORMAL HIGH (ref 11.5–15.5)
RDW: 16 % — ABNORMAL HIGH (ref 11.5–15.5)
WBC: 12.5 10*3/uL — ABNORMAL HIGH (ref 4.0–10.5)

## 2011-05-23 LAB — CREATININE, SERUM
Creatinine, Ser: 0.83 mg/dL (ref 0.50–1.10)
GFR calc Af Amer: >90 mL/min (ref >90)

## 2011-05-23 LAB — DIFFERENTIAL
Basophils Absolute: 0 10*3/uL (ref 0.0–0.1)
Basophils Relative: 0 % (ref 0–1)
Eosinophils Relative: 4 % (ref 0–5)
Monocytes Absolute: 0.9 10*3/uL (ref 0.1–1.0)

## 2011-05-23 LAB — GLUCOSE, CAPILLARY: Glucose-Capillary: 155 mg/dL — ABNORMAL HIGH (ref 70–99)

## 2011-05-23 LAB — BASIC METABOLIC PANEL
BUN: 8 mg/dL (ref 6–23)
CO2: 27 mEq/L (ref 19–32)
Chloride: 101 mEq/L (ref 96–112)
Creatinine, Ser: 0.84 mg/dL (ref 0.50–1.10)
Glucose, Bld: 106 mg/dL — ABNORMAL HIGH (ref 70–99)

## 2011-05-23 LAB — HEMOGLOBIN A1C
Hgb A1c MFr Bld: 6.1 % — ABNORMAL HIGH (ref <5.7)
Mean Plasma Glucose: 128 mg/dL — ABNORMAL HIGH (ref <117)

## 2011-05-23 MED ORDER — ENOXAPARIN SODIUM 40 MG/0.4ML ~~LOC~~ SOLN
40.0000 mg | SUBCUTANEOUS | Status: DC
  Administered 2011-05-23 – 2011-05-24 (×2): 40 mg via SUBCUTANEOUS
  Filled 2011-05-23 (×3): qty 0.4

## 2011-05-23 MED ORDER — SENNA 8.6 MG PO TABS
2.0000 | ORAL_TABLET | Freq: Every day | ORAL | Status: DC | PRN
  Filled 2011-05-23: qty 2

## 2011-05-23 MED ORDER — ACETAMINOPHEN 325 MG PO TABS
650.0000 mg | ORAL_TABLET | Freq: Four times a day (QID) | ORAL | Status: DC | PRN
  Administered 2011-05-24 – 2011-05-27 (×6): 650 mg via ORAL
  Filled 2011-05-23 (×7): qty 2

## 2011-05-23 MED ORDER — ALUM & MAG HYDROXIDE-SIMETH 200-200-20 MG/5ML PO SUSP: 30.0000 mL | Freq: Four times a day (QID) | ORAL | Status: DC | PRN

## 2011-05-23 MED ORDER — ACETAMINOPHEN 650 MG RE SUPP: 650.0000 mg | Freq: Four times a day (QID) | RECTAL | Status: DC | PRN

## 2011-05-23 MED ORDER — PROMETHAZINE HCL 25 MG/ML IJ SOLN
12.5000 mg | Freq: Four times a day (QID) | INTRAMUSCULAR | Status: DC | PRN
  Filled 2011-05-23: qty 1

## 2011-05-23 MED ORDER — ALBUTEROL SULFATE (5 MG/ML) 0.5% IN NEBU
5.0000 mg | INHALATION_SOLUTION | Freq: Once | RESPIRATORY_TRACT | Status: AC
  Administered 2011-05-23: 5 mg via RESPIRATORY_TRACT
  Filled 2011-05-23: qty 1

## 2011-05-23 MED ORDER — DEXTROSE 5 % IV SOLN
1.0000 g | INTRAVENOUS | Status: DC
  Administered 2011-05-24 – 2011-05-27 (×5): 1 g via INTRAVENOUS
  Filled 2011-05-23 (×6): qty 10

## 2011-05-23 MED ORDER — ALBUTEROL SULFATE (5 MG/ML) 0.5% IN NEBU
INHALATION_SOLUTION | RESPIRATORY_TRACT | Status: AC
  Administered 2011-05-23: 5 mg via RESPIRATORY_TRACT
  Filled 2011-05-23: qty 1

## 2011-05-23 MED ORDER — POLYETHYLENE GLYCOL 3350 17 G PO PACK
17.0000 g | PACK | Freq: Every day | ORAL | Status: DC
  Administered 2011-05-23 – 2011-05-27 (×5): 17 g via ORAL
  Filled 2011-05-23 (×6): qty 1

## 2011-05-23 MED ORDER — DEXTROSE 5 % IV SOLN
500.0000 mg | INTRAVENOUS | Status: AC
  Administered 2011-05-23: 500 mg via INTRAVENOUS
  Filled 2011-05-23: qty 500

## 2011-05-23 MED ORDER — IPRATROPIUM BROMIDE 0.02 % IN SOLN
0.5000 mg | Freq: Four times a day (QID) | RESPIRATORY_TRACT | Status: DC
  Administered 2011-05-23 – 2011-05-24 (×4): 0.5 mg via RESPIRATORY_TRACT
  Filled 2011-05-23 (×4): qty 2.5

## 2011-05-23 MED ORDER — LISINOPRIL 10 MG PO TABS
10.0000 mg | ORAL_TABLET | Freq: Every day | ORAL | Status: DC
  Administered 2011-05-24 – 2011-05-26 (×3): 10 mg via ORAL
  Filled 2011-05-23 (×3): qty 1

## 2011-05-23 MED ORDER — SIMVASTATIN 20 MG PO TABS
20.0000 mg | ORAL_TABLET | Freq: Every day | ORAL | Status: DC
  Administered 2011-05-24 – 2011-05-27 (×4): 20 mg via ORAL
  Filled 2011-05-23 (×5): qty 1

## 2011-05-23 MED ORDER — DEXTROSE 5 % IV SOLN
500.0000 mg | INTRAVENOUS | Status: DC
  Administered 2011-05-24 – 2011-05-27 (×5): 500 mg via INTRAVENOUS
  Filled 2011-05-23 (×6): qty 500

## 2011-05-23 MED ORDER — DEXTROSE 5 % IV SOLN
1.0000 g | INTRAVENOUS | Status: DC
  Filled 2011-05-23: qty 10

## 2011-05-23 MED ORDER — ONDANSETRON HCL 4 MG PO TABS: 4.0000 mg | ORAL_TABLET | Freq: Four times a day (QID) | ORAL | Status: DC | PRN

## 2011-05-23 MED ORDER — DEXTROSE 5 % IV SOLN: 1.0000 g | INTRAVENOUS | Status: DC

## 2011-05-23 MED ORDER — PANTOPRAZOLE SODIUM 40 MG PO TBEC
40.0000 mg | DELAYED_RELEASE_TABLET | Freq: Every day | ORAL | Status: DC
  Administered 2011-05-24 – 2011-05-28 (×5): 40 mg via ORAL
  Filled 2011-05-23 (×5): qty 1

## 2011-05-23 MED ORDER — IPRATROPIUM BROMIDE 0.02 % IN SOLN: 0.5000 mg | RESPIRATORY_TRACT | Status: DC | PRN

## 2011-05-23 MED ORDER — MAGNESIUM SULFATE 40 MG/ML IJ SOLN
2.0000 g | INTRAMUSCULAR | Status: AC
  Administered 2011-05-23: 2 g via INTRAVENOUS
  Filled 2011-05-23: qty 50

## 2011-05-23 MED ORDER — RISPERIDONE 2 MG PO TABS
2.0000 mg | ORAL_TABLET | Freq: Every day | ORAL | Status: DC
  Administered 2011-05-23 – 2011-05-27 (×5): 2 mg via ORAL
  Filled 2011-05-23 (×6): qty 1

## 2011-05-23 MED ORDER — LORATADINE 10 MG PO TABS
10.0000 mg | ORAL_TABLET | Freq: Every day | ORAL | Status: DC | PRN
  Filled 2011-05-23: qty 1

## 2011-05-23 MED ORDER — GUAIFENESIN-DM 100-10 MG/5ML PO SYRP
5.0000 mL | ORAL_SOLUTION | ORAL | Status: DC | PRN
  Administered 2011-05-23 – 2011-05-25 (×3): 5 mL via ORAL
  Filled 2011-05-23 (×3): qty 5

## 2011-05-23 MED ORDER — ALBUTEROL SULFATE (5 MG/ML) 0.5% IN NEBU
2.5000 mg | INHALATION_SOLUTION | Freq: Four times a day (QID) | RESPIRATORY_TRACT | Status: DC
  Administered 2011-05-23 – 2011-05-24 (×4): 2.5 mg via RESPIRATORY_TRACT
  Filled 2011-05-23 (×4): qty 0.5

## 2011-05-23 MED ORDER — FLEET ENEMA 7-19 GM/118ML RE ENEM
1.0000 | ENEMA | Freq: Every day | RECTAL | Status: DC | PRN
  Filled 2011-05-23: qty 1

## 2011-05-23 MED ORDER — GABAPENTIN 600 MG PO TABS
600.0000 mg | ORAL_TABLET | Freq: Two times a day (BID) | ORAL | Status: DC
  Administered 2011-05-23 – 2011-05-28 (×10): 600 mg via ORAL
  Filled 2011-05-23 (×11): qty 1

## 2011-05-23 MED ORDER — ALBUTEROL SULFATE (5 MG/ML) 0.5% IN NEBU
5.0000 mg | INHALATION_SOLUTION | RESPIRATORY_TRACT | Status: DC | PRN
  Filled 2011-05-23: qty 2

## 2011-05-23 MED ORDER — METHYLPREDNISOLONE SODIUM SUCC 125 MG IJ SOLR
80.0000 mg | Freq: Three times a day (TID) | INTRAMUSCULAR | Status: DC
  Administered 2011-05-23 – 2011-05-25 (×7): 80 mg via INTRAVENOUS
  Filled 2011-05-23 (×5): qty 1.28
  Filled 2011-05-23 (×2): qty 2
  Filled 2011-05-23: qty 1.28
  Filled 2011-05-23: qty 2
  Filled 2011-05-23 (×3): qty 1.28

## 2011-05-23 MED ORDER — IPRATROPIUM BROMIDE 0.02 % IN SOLN
RESPIRATORY_TRACT | Status: AC
  Filled 2011-05-23: qty 2.5

## 2011-05-23 MED ORDER — ALBUTEROL SULFATE (5 MG/ML) 0.5% IN NEBU
2.5000 mg | INHALATION_SOLUTION | RESPIRATORY_TRACT | Status: DC | PRN
  Administered 2011-05-24: 2.5 mg via RESPIRATORY_TRACT
  Filled 2011-05-23: qty 0.5

## 2011-05-23 MED ORDER — ONDANSETRON HCL 4 MG/2ML IJ SOLN: 4.0000 mg | Freq: Four times a day (QID) | INTRAMUSCULAR | Status: DC | PRN

## 2011-05-23 MED ORDER — METHYLPREDNISOLONE SODIUM SUCC 125 MG IJ SOLR
125.0000 mg | Freq: Once | INTRAMUSCULAR | Status: AC
  Administered 2011-05-23: 125 mg via INTRAVENOUS
  Filled 2011-05-23: qty 2

## 2011-05-23 MED ORDER — METOPROLOL SUCCINATE ER 100 MG PO TB24
100.0000 mg | ORAL_TABLET | Freq: Every day | ORAL | Status: DC
  Administered 2011-05-24 – 2011-05-28 (×5): 100 mg via ORAL
  Filled 2011-05-23 (×5): qty 1

## 2011-05-23 MED ORDER — SODIUM CHLORIDE 0.9 % IV SOLN
INTRAVENOUS | Status: DC
  Administered 2011-05-23: 20 mL/h via INTRAVENOUS

## 2011-05-23 MED ORDER — PROMETHAZINE HCL 25 MG PO TABS: 12.5000 mg | ORAL_TABLET | Freq: Four times a day (QID) | ORAL | Status: DC | PRN

## 2011-05-23 MED ORDER — IPRATROPIUM BROMIDE 0.02 % IN SOLN
0.5000 mg | RESPIRATORY_TRACT | Status: AC
  Administered 2011-05-23: 0.5 mg via RESPIRATORY_TRACT
  Filled 2011-05-23: qty 2.5

## 2011-05-23 MED ORDER — ALBUTEROL (5 MG/ML) CONTINUOUS INHALATION SOLN
10.0000 mg/h | INHALATION_SOLUTION | RESPIRATORY_TRACT | Status: DC
  Administered 2011-05-23: 10 mg/h via RESPIRATORY_TRACT

## 2011-05-23 MED ORDER — BUPROPION HCL ER (XL) 150 MG PO TB24
150.0000 mg | ORAL_TABLET | Freq: Every day | ORAL | Status: DC
  Administered 2011-05-24 – 2011-05-28 (×5): 150 mg via ORAL
  Filled 2011-05-23 (×5): qty 1

## 2011-05-23 MED ORDER — INSULIN ASPART 100 UNIT/ML ~~LOC~~ SOLN
0.0000 [IU] | Freq: Three times a day (TID) | SUBCUTANEOUS | Status: DC
  Administered 2011-05-24: 2 [IU] via SUBCUTANEOUS
  Administered 2011-05-24: 3 [IU] via SUBCUTANEOUS
  Administered 2011-05-25 – 2011-05-27 (×4): 2 [IU] via SUBCUTANEOUS
  Filled 2011-05-23 (×5): qty 3

## 2011-05-23 MED ORDER — IPRATROPIUM BROMIDE 0.02 % IN SOLN: 0.5000 mg | Freq: Once | RESPIRATORY_TRACT | Status: DC

## 2011-05-23 NOTE — ED Notes (Signed)
Pt tolerated walking with 02 sats maintaining at 94%. Pt with coughing exacerbation. PA aware and to consult hospitalist for admittance.

## 2011-05-23 NOTE — H&P (Signed)
PATIENT DETAILS Name: Beth [name] Age: 45 y.o. Sex: female Date of Birth: 10/13/1965 Admit Date: 05/23/2011 PCP: At [facility]   CHIEF COMPLAINT:  Worsening shortness of breath and cough for the past 2 weeks.  HPI: Patient is a 45 year old African American female with a past medical history of bronchial asthma, depression, hypertension, diabetes who presents to the hospital with the above-noted complaints. Patient history dates back to shortness of breath. She has been using her inhalers and nebulizers more frequently. Over the holiday weekend this has gotten much more worse. And over the past few days this is worsened even further. Last night patient was up all night because of shortness of breath. She has mostly dry cough. She denies fever. She denies any chest pain. She has been in the ED for 6 hours has received numerous nebulized bronchodilators and steroids, she feels much better than what she came in with. She is still not at her usual baseline, she is still wheezing and the hospitalist service has been asked to admit this patient for further evaluation. -She claims she has used her nebulizers and inhalers more frequently over the past 2 days and the symptoms have still worsened. -She denies any headache, nausea vomiting diarrhea and abdominal pain.  ALLERGIES:  No Known Allergies  PAST MEDICAL HISTORY: Past Medical History  Diagnosis Date  . Diabetes mellitus   . Hypertension   . Asthma   . Arthritis   . Coronary artery disease     PAST SURGICAL HISTORY: Past Surgical History  Procedure Date  . Cholecystectomy   . Hernia repair     MEDICATIONS AT HOME: Prior to Admission medications   Medication Sig Start Date End Date Taking? Authorizing Provider  albuterol (PROVENTIL HFA;VENTOLIN HFA) 108 (90 BASE) MCG/ACT inhaler Inhale 2 puffs into the lungs every 4 (four) hours as needed. For shortness of breath    Yes Historical Provider, MD  buPROPion (WELLBUTRIN XL)  150 MG 24 hr tablet Take 150 mg by mouth daily.     Yes Historical Provider, MD  Fluticasone-Salmeterol (ADVAIR) 250-50 MCG/DOSE AEPB Inhale 1 puff into the lungs 2 (two) times daily as needed. For shortness of breath    Yes Historical Provider, MD  gabapentin (NEURONTIN) 600 MG tablet Take 600 mg by mouth 2 (two) times daily.     Yes Historical Provider, MD  lisinopril (PRINIVIL,ZESTRIL) 10 MG tablet Take 10 mg by mouth daily.     Yes Historical Provider, MD  loratadine (CLARITIN) 10 MG tablet Take 10 mg by mouth daily.     Yes Historical Provider, MD  metFORMIN (GLUCOPHAGE) 500 MG tablet Take 500 mg by mouth 2 (two) times daily with a meal.     Yes Historical Provider, MD  metoprolol (TOPROL-XL) 100 MG 24 hr tablet Take 100 mg by mouth daily.     Yes Historical Provider, MD  pantoprazole (PROTONIX) 40 MG tablet Take 40 mg by mouth daily.     Yes Historical Provider, MD  pravastatin (PRAVACHOL) 40 MG tablet Take 40 mg by mouth daily.     Yes Historical Provider, MD  PRESCRIPTION MEDICATION Take 1 tablet by mouth daily. Sertraline (Zoloft)    Yes Historical Provider, MD  risperiDONE (RISPERDAL) 2 MG tablet Take 2 mg by mouth at bedtime.     Yes Historical Provider, MD    FAMILY HISTORY: History reviewed. No pertinent family history.  SOCIAL HISTORY:  does not have a smoking history on file. She does not have  any smokeless tobacco history on file. She reports that she does not drink alcohol or use illicit drugs.  REVIEW OF SYSTEMS:  Constitutional:   No  weight loss, night sweats,  Fevers, chills, fatigue.  HEENT:    No headaches, Difficulty swallowing,Tooth/dental problems,Sore throat,  No sneezing, itching, ear ache, nasal congestion, post nasal drip,   Cardio-vascular: No chest pain,  Orthopnea, PND, swelling in lower extremities, anasarca, dizziness, palpitations  GI:  No heartburn, indigestion, abdominal pain, nausea, vomiting, diarrhea, change in       bowel habits, loss of  appetite  Resp: No shortness of breath at rest.  No excess mucus, no productive cough, No coughing up of blood.No change in color of mucus.No chest wall deformity  Skin:  no rash or lesions.  GU:  no dysuria, change in color of urine, no urgency or frequency.  No flank pain.  Musculoskeletal: No joint pain or swelling.  No decreased range of motion.  No back pain.  Psych: No change in mood or affect. No depression or anxiety.  No memory loss.   PHYSICAL EXAM: Blood pressure 144/81, pulse 102, temperature 97.9 F (36.6 C), temperature source Oral, resp. rate 16, SpO2 94.00%.  General appearance :Awake, alert, not in any distress. Speech Clear. Not toxic Looking. Speaking in full sentences. Not using any accessory muscles of respiration. HEENT: Atraumatic and Normocephalic, pupils equally reactive to light and accomodation Neck: supple, no JVD. No cervical lymphadenopathy.  Chest:Good air entry bilaterally, coarse end -expiratory wheezing. CVS: S1 S2 regular, no murmurs.  Abdomen: Bowel sounds present, Non tender and not distended with no gaurding, rigidity or rebound. Patient is obese Extremities: B/L Lower Ext shows no edema, both legs are warm to touch, with  dorsalis pedis pulses palpable. Skin depigmentation Neurology: Awake alert, and oriented X 3, CN II-XII intact, Non focal, Deep Tendon Reflex-2+ all over, plantar's downgoing B/L, sensory exam is grossly intact.  Skin:No Rash Wounds:N/A  LABS ON ADMISSION:   Basename 05/23/11 0952  NA 140  K 4.0  CL 101  CO2 27  GLUCOSE 106*  BUN 8  CREATININE 0.84  CALCIUM 9.0  MG --  PHOS --   No results found for this basename: AST:2,ALT:2,ALKPHOS:2,BILITOT:2,PROT:2,ALBUMIN:2 in the last 72 hours No results found for this basename: LIPASE:2,AMYLASE:2 in the last 72 hours  Basename 05/23/11 0952  WBC 13.0*  NEUTROABS 8.5*  HGB 12.5  HCT 38.7  MCV 89.0  PLT 299   No results found for this basename:  CKTOTAL:3,CKMB:3,CKMBINDEX:3,TROPONINI:3 in the last 72 hours No results found for this basename: DDIMER:2 in the last 72 hours No results found for this basename: POCBNP:3 in the last 72 hours   RADIOLOGIC STUDIES ON ADMISSION: Dg Chest Port 1 View  05/23/2011  *RADIOLOGY REPORT*  Clinical Data: Shortness of breath, asthma.  PORTABLE CHEST - 1 VIEW  Comparison: 04/15/2011  Findings: There is peribronchial thickening and diffuse interstitial prominence, similar prior study.  Mild cardiomegaly and vascular congestion.  No overt edema or confluent opacities. No effusions.  No acute bony abnormality.  IMPRESSION: Chronic peribronchial thickening, likely related to patient's asthma.  Borderline cardiomegaly.  Original Report Authenticated By: Cyndie Chime, M.D.    ASSESSMENT AND PLAN: Present on Admission:  .Asthma with exacerbation -Patient comes in with what looks to be like her usual asthma flare. -Although she is still wheezing, she is much better than what she initially presented to the ED with. -She will be admitted to a regular medical surgical  bed, she will be placed on nebulized bronchodilators and intravenous Solu-Medrol. -She will also be empirically placed on Rocephin and Zithromax.  -She will be followed closely, and further plans and recommendations will be made as her clinical course evolves -Although she is wheezing, she is not in distress and is not using any accessory muscles of respiration. She is easy talking in full sentences.   .Depression -Patient is stable, she denies any suicidal or homicidal tendencies. Her mood seems stable. - we'll resume Wellbutrin, Risperdal   .Neuropathy -Continue with Neurontin.   Marland KitchenHYPERTENSION, BENIGN SYSTEMIC -Continue with Toprol and lisinopril.  -Will follow blood pressure readings and titrate medications accordingly.   Marland KitchenHYPERLIPIDEMIA -Continue with statins.   .OBESITY, NOS -She already has been counseled extensively. -Will get  nutritional  evaluation while she is here.  Further plan will depend as patient's clinical course evolves and further radiologic and laboratory data become available. Patient will be monitored closely.   DVT Prophylaxis:  subcutaneous Lovenox   Code Status:  full code   Total time spent for admission equals 45 minutes.  Jeoffrey Massed 05/23/2011, 6:19 PM

## 2011-05-23 NOTE — ED Notes (Signed)
States feeling better

## 2011-05-23 NOTE — ED Provider Notes (Signed)
Medical screening examination/treatment/procedure(s) were conducted as a shared visit with non-physician practitioner(s) and myself.  I personally evaluated the patient during the encounter   Joya Gaskins, MD 05/23/11 [phone]

## 2011-05-23 NOTE — ED Notes (Signed)
Rt called to admin neb

## 2011-05-23 NOTE — ED Provider Notes (Signed)
History     CSN: [id number] Arrival date & time: 05/23/2011  9:44 AM   First MD Initiated Contact with Patient 05/23/11 [phone]      Chief Complaint  Patient presents with  . Respiratory Distress     Patient is a 45 y.o. female presenting with shortness of breath.  Shortness of Breath  The current episode started 5 to 7 days ago. The problem occurs frequently. The problem has been gradually worsening. The problem is severe. The symptoms are relieved by nothing. The symptoms are aggravated by nothing. Associated symptoms include cough, shortness of breath and wheezing. Her past medical history is significant for asthma. Her past medical history does not include asthma in the family.   Family history - negative for asthma    Past Medical History  Diagnosis Date  . Diabetes mellitus   . Hypertension   . Asthma   . Arthritis   . Coronary artery disease     Past Surgical History  Procedure Date  . Cholecystectomy   . Hernia repair     \  History  Substance Use Topics  . Smoking status: Not on file  . Smokeless tobacco: Not on file  . Alcohol Use: No    OB History    Grav Para Term Preterm Abortions TAB SAB Ect Mult Living                  Review of Systems  Respiratory: Positive for cough, shortness of breath and wheezing.   All other systems reviewed and are negative.    Allergies  Review of patient's allergies indicates no known allergies.  Home Medications  No current outpatient prescriptions on file.  BP 145/97  Pulse 97  Resp 22  SpO2 100%  Physical Exam  CONSTITUTIONAL: Well developed/well nourished, pt in distress HEAD AND FACE: Normocephalic/atraumatic EYES: EOMI/PERRL ENMT: Mucous membranes moist NECK: supple no meningeal signs CV: S1/S2 noted, no murmurs/rubs/gallops noted LUNGS: tachypnea, wheezing bilaterally, but she is able to speak to me ABDOMEN: soft, nontender, no rebound or guarding, obese GU:no cva tenderness NEURO: Pt is  awake/alert, moves all extremitiesx4 EXTREMITIES: pulses normal, full ROM SKIN: warm, color normal PSYCH: no abnormalities of mood noted   ED Course  Procedures    Labs Reviewed  BASIC METABOLIC PANEL  CBC  DIFFERENTIAL     10:11 AM Pt with asthma, tachypnea, resp treatments started immediately Will follow closely  10:17 AM Pt appears improved   11:03 AM Pt stabilized, would benefit from further treatment in the ED, will place in CDU obs for asthma D/w PA [place] Pt agreeable BP 145/97  Pulse 97  Resp 22  SpO2 100%    MDM  Nursing notes reviewed and considered in documentation Previous records reviewed and considered xrays reviewed and considered All labs/vitals reviewed and considered         Joya Gaskins, MD 05/23/11 1104

## 2011-05-23 NOTE — ED Notes (Signed)
Pt with asthma flare up beginning last Thursday. This AM got worse. Pt arrives tachypneic, tripoding and wheezing throughout all fields

## 2011-05-23 NOTE — ED Notes (Signed)
Portable xray at present

## 2011-05-23 NOTE — ED Notes (Signed)
Pt to be ambulated to assess room air sat

## 2011-05-23 NOTE — ED Provider Notes (Signed)
Patient presents to the emergency room with complaint of wheezing and increased respiratory effort. Patient coughing as well. PMH significant for bronchitis. Previous smoker. Patient denies any other concern at this time. Denies any chest pain. Patient has audible wheezing on examination. Diffuse rhonchi. Patient placed on asthma exacerbation protocol. D/w dr. Bebe Shaggy. Will continue to monitor closely.   12:57 PM patient seen and re-evaluated. Patient oxygen dropped to 88% with ambulating. Diffuse wheezing. Will give patient hour long nebulizer treatment. Patient has had steroids. Will give magnesium. D/w dr. Bebe Shaggy. Continue on protocol.  2:06 PM Patient seen and re-evaluated. Resting comfortably. Continues to wheeze. Continuous neb. Will re-evaluate.  Patient care taken over by Marlon Pel, PA-C  Demetrius Charity, [place] 05/23/11 [phone]

## 2011-05-24 LAB — CBC
MCH: 27.4 pg (ref 26.0–34.0)
MCV: 89 fL (ref 78.0–100.0)
Platelets: 313 10*3/uL (ref 150–400)
RDW: 16.2 % — ABNORMAL HIGH (ref 11.5–15.5)
WBC: 15.6 10*3/uL — ABNORMAL HIGH (ref 4.0–10.5)

## 2011-05-24 LAB — BASIC METABOLIC PANEL
CO2: 26 mEq/L (ref 19–32)
Calcium: 9.4 mg/dL (ref 8.4–10.5)
Creatinine, Ser: 0.72 mg/dL (ref 0.50–1.10)
GFR calc non Af Amer: >90 mL/min (ref >90)

## 2011-05-24 LAB — GLUCOSE, CAPILLARY: Glucose-Capillary: 135 mg/dL — ABNORMAL HIGH (ref 70–99)

## 2011-05-24 MED ORDER — IPRATROPIUM BROMIDE 0.02 % IN SOLN
0.5000 mg | Freq: Four times a day (QID) | RESPIRATORY_TRACT | Status: DC
  Administered 2011-05-24 – 2011-05-26 (×6): 0.5 mg via RESPIRATORY_TRACT
  Filled 2011-05-24 (×6): qty 2.5

## 2011-05-24 MED ORDER — ALBUTEROL SULFATE (5 MG/ML) 0.5% IN NEBU
2.5000 mg | INHALATION_SOLUTION | Freq: Four times a day (QID) | RESPIRATORY_TRACT | Status: DC
  Administered 2011-05-24 – 2011-05-26 (×6): 2.5 mg via RESPIRATORY_TRACT
  Filled 2011-05-24 (×6): qty 0.5

## 2011-05-24 NOTE — Progress Notes (Signed)
PATIENT DETAILS Name: Beth [name] Age: 45 y.o. Sex: female Date of Birth: October 18, 1965 Admit Date: 05/23/2011 PCP: At [facility].  Subjective: Better, still claims to be tight at times. Looks comfortable. Not using any accessory muscles of respiration. Easily speaking in full sentences.  Objective: Vital signs in last 24 hours: Filed Vitals:   05/24/11 0509 05/24/11 0700 05/24/11 1025 05/24/11 1405  BP: 125/83  144/90 148/82  Pulse: 85  92 92  Temp: 97.7 F (36.5 C)   98.2 F (36.8 C)  TempSrc: Oral   Oral  Resp: 18   18  Height:      Weight: 161.571 kg (356 lb 3.2 oz)     SpO2: 94% 88%  91%    Weight change:   Body mass index is 54.16 kg/(m^2).  Intake/Output from previous day:  Intake/Output Summary (Last 24 hours) at 05/24/11 1626 Last data filed at 05/24/11 1136  Gross per 24 hour  Intake    600 ml  Output   1400 ml  Net   -800 ml    PHYSICAL EXAM: Gen Exam: Awake and alert with clear speech.   Neck: Supple, No JVD.   Chest: B/L air entry. Still has diffuse end expiratory rhonchi. CVS: S1 S2 Regular, no murmurs.  Abdomen: soft, BS +, non tender, non distended.  Extremities: no edema, lower extremities warm to touch. Neurologic: Non Focal.   Skin: No Rash.   Wounds: N/A.    CONSULTS:  none  LAB RESULTS: CBC  Lab 05/24/11 0627 05/23/11 2054 05/23/11 0952  WBC 15.6* 12.5* 13.0*  HGB 12.2 12.4 12.5  HCT 39.7 38.5 38.7  PLT 313 326 299  MCV 89.0 88.5 89.0  MCH 27.4 28.5 28.7  MCHC 30.7 32.2 32.3  RDW 16.2* 16.0* 16.2*  LYMPHSABS -- -- 3.1  MONOABS -- -- 0.9  EOSABS -- -- 0.5  BASOSABS -- -- 0.0  BANDABS -- -- --    Chemistries   Lab 05/24/11 0627 05/23/11 2054 05/23/11 0952  NA 136 -- 140  K 5.0 -- 4.0  CL 100 -- 101  CO2 26 -- 27  GLUCOSE 169* -- 106*  BUN 12 -- 8  CREATININE 0.72 0.83 0.84  CALCIUM 9.4 -- 9.0  MG -- -- --    GFR Estimated Creatinine Clearance: 142.9 ml/min (by C-G formula based on Cr of  0.72).  Coagulation profile No results found for this basename: INR:5,PROTIME:5 in the last 168 hours  Cardiac Enzymes No results found for this basename: CK:3,CKMB:3,TROPONINI:3,MYOGLOBIN:3 in the last 168 hours  No results found for this basename: POCBNP:3 in the last 168 hours No results found for this basename: DDIMER:2 in the last 72 hours  Basename 05/23/11 1822  HGBA1C 6.1*   No results found for this basename: CHOL:2,HDL:2,LDLCALC:2,TRIG:2,CHOLHDL:2,LDLDIRECT:2 in the last 72 hours No results found for this basename: TSH,T4TOTAL,FREET3,T3FREE,THYROIDAB in the last 72 hours No results found for this basename: VITAMINB12:2,FOLATE:2,FERRITIN:2,TIBC:2,IRON:2,RETICCTPCT:2 in the last 72 hours No results found for this basename: LIPASE:2,AMYLASE:2 in the last 72 hours  Urine Studies No results found for this basename: UACOL:2,UAPR:2,USPG:2,UPH:2,UTP:2,UGL:2,UKET:2,UBIL:2,UHGB:2,UNIT:2,UROB:2,ULEU:2,UEPI:2,UWBC:2,URBC:2,UBAC:2,CAST:2,CRYS:2,UCOM:2,BILUA:2 in the last 72 hours  MICROBIOLOGY: No results found for this or any previous visit (from the past 240 hour(s)).  RADIOLOGY STUDIES/RESULTS: Dg Chest Port 1 View  05/23/2011  *RADIOLOGY REPORT*  Clinical Data: Shortness of breath, asthma.  PORTABLE CHEST - 1 VIEW  Comparison: 04/15/2011  Findings: There is peribronchial thickening and diffuse interstitial prominence, similar prior study.  Mild cardiomegaly and vascular  congestion.  No overt edema or confluent opacities. No effusions.  No acute bony abnormality.  IMPRESSION: Chronic peribronchial thickening, likely related to patient's asthma.  Borderline cardiomegaly.  Original Report Authenticated By: Cyndie Chime, M.D.    MEDICATIONS: Scheduled Meds:   . albuterol  2.5 mg Nebulization Q6H  . azithromycin  500 mg Intravenous Q24H  . azithromycin  500 mg Intravenous To Major  . buPROPion  150 mg Oral Daily  . cefTRIAXone (ROCEPHIN)  IV  1 g Intravenous Q24H  . enoxaparin   40 mg Subcutaneous Q24H  . gabapentin  600 mg Oral BID  . insulin aspart  0-15 Units Subcutaneous TID WC  . ipratropium  0.5 mg Nebulization Q6H  . lisinopril  10 mg Oral Daily  . methylPREDNISolone (SOLU-MEDROL) injection  80 mg Intravenous TID  . metoprolol  100 mg Oral Daily  . pantoprazole  40 mg Oral Daily  . polyethylene glycol  17 g Oral Daily  . risperiDONE  2 mg Oral QHS  . simvastatin  20 mg Oral q1800  . DISCONTD: cefTRIAXone (ROCEPHIN)  IV  1 g Intravenous Q24H  . DISCONTD: cefTRIAXone (ROCEPHIN)  IV  1 g Intravenous To Major   Continuous Infusions:   . sodium chloride 20 mL/hr (05/23/11 2145)  . DISCONTD: albuterol Stopped (05/23/11 1435)   PRN Meds:.acetaminophen, acetaminophen, albuterol, alum & mag hydroxide-simeth, guaiFENesin-dextromethorphan, loratadine, ondansetron (ZOFRAN) IV, ondansetron, promethazine, promethazine, senna, sodium phosphate, DISCONTD: albuterol, DISCONTD: ipratropium  Antibiotics: Anti-infectives     Start     Dose/Rate Route Frequency Ordered Stop   05/24/11 1900   cefTRIAXone (ROCEPHIN) 1 g in dextrose 5 % 50 mL IVPB  Status:  Discontinued        1 g 100 mL/hr over 30 Minutes Intravenous Every 24 hours 05/23/11 1954 05/23/11 2100   05/24/11 1900   azithromycin (ZITHROMAX) 500 mg in dextrose 5 % 250 mL IVPB        500 mg 250 mL/hr over 60 Minutes Intravenous Every 24 hours 05/23/11 1954     05/23/11 2130   cefTRIAXone (ROCEPHIN) 1 g in dextrose 5 % 50 mL IVPB        1 g 100 mL/hr over 30 Minutes Intravenous Every 24 hours 05/23/11 2100     05/23/11 1915   azithromycin (ZITHROMAX) 500 mg in dextrose 5 % 250 mL IVPB        500 mg 250 mL/hr over 60 Minutes Intravenous To Major Emergency Dept 05/23/11 1909 05/23/11 2244   05/23/11 1915   cefTRIAXone (ROCEPHIN) 1 g in dextrose 5 % 50 mL IVPB  Status:  Discontinued        1 g 100 mL/hr over 30 Minutes Intravenous To Major Emergency Dept 05/23/11 1909 05/23/11 2100           Assessment/Plan: Patient Active Hospital Problem List: Asthma with exacerbation    Assessment: Some improvement overnight.    Plan: Continue with steroids, nebulized bronchodilators and empiric antibiotics. Continue to monitor and follow clinical course. With improvement will slowly need to decrease in steroid dosing.   HYPERLIPIDEMIA    Assessment: Stable   Plan: Continue with Zocor. Further outpatient workup.   OBESITY, NOS    Assessment: Has put on weight recently.    Plan: Have counseled extensively. Appreciate nutritional input.   HYPERTENSION, BENIGN SYSTEMIC    Assessment: Controlled    Plan: Continue with lisinopril and metoprolol. However if she still has bronchospasm, then we'll need to consider stopping  metoprolol.   Diabetes   Assessment: Controlled, HbA1c 6.1   Plan: Continue with SSI for now, resume metformin on discharge.  Depression    Assessment: Stable    Plan: Continue with Risperdal and Wellbutrin.   Neuropathy    Assessment: Stable    Plan: Continue with Neurontin  Disposition: Remain inpatient-home when ready  DVT Prophylaxis: Subcutaneous Lovenox  Code Status: Full code  Maretta Bees, fifth  MD. 05/24/2011, 4:26 PM

## 2011-05-24 NOTE — Progress Notes (Signed)
Utilization Review Completed.Beth [name] T11/29/2012   

## 2011-05-24 NOTE — Progress Notes (Signed)
INITIAL ADULT NUTRITION ASSESSMENT Date: 05/24/2011   Time: 11:50 AM Reason for Assessment: Consult  ASSESSMENT: Female 45 y.o.  Dx: Asthma with exacerbation  Past Medical History  Diagnosis Date  . Diabetes mellitus   . Hypertension   . Asthma   . Arthritis   . Coronary artery disease   . Hyperlipidemia   . Phlebitis   . Asthmatic bronchitis , chronic   . Shortness of breath 05/23/11    "exertion; laying down"  . Depression   . Anxiety     Scheduled Meds:    . albuterol  2.5 mg Nebulization Q6H  . azithromycin  500 mg Intravenous Q24H  . azithromycin  500 mg Intravenous To Major  . buPROPion  150 mg Oral Daily  . cefTRIAXone (ROCEPHIN)  IV  1 g Intravenous Q24H  . enoxaparin  40 mg Subcutaneous Q24H  . gabapentin  600 mg Oral BID  . insulin aspart  0-15 Units Subcutaneous TID WC  . ipratropium  0.5 mg Nebulization STAT  . ipratropium  0.5 mg Nebulization Q6H  . lisinopril  10 mg Oral Daily  . magnesium sulfate IVPB  2 g Intravenous To Major  . methylPREDNISolone (SOLU-MEDROL) injection  80 mg Intravenous TID  . metoprolol  100 mg Oral Daily  . pantoprazole  40 mg Oral Daily  . polyethylene glycol  17 g Oral Daily  . risperiDONE  2 mg Oral QHS  . simvastatin  20 mg Oral q1800  . DISCONTD: cefTRIAXone (ROCEPHIN)  IV  1 g Intravenous Q24H  . DISCONTD: cefTRIAXone (ROCEPHIN)  IV  1 g Intravenous To Major   Continuous Infusions:    . sodium chloride 20 mL/hr (05/23/11 2145)  . DISCONTD: albuterol Stopped (05/23/11 1435)   PRN Meds:.acetaminophen, acetaminophen, albuterol, alum & mag hydroxide-simeth, guaiFENesin-dextromethorphan, loratadine, ondansetron (ZOFRAN) IV, ondansetron, promethazine, promethazine, senna, sodium phosphate, DISCONTD: albuterol, DISCONTD: ipratropium   Ht: 5\' 8"  (172.7 cm)  Wt: 356 lb 3.2 oz (161.571 kg) (scale c)  Ideal Wt:  140 lb (63.6 kg) % Ideal Wt: 254%  Usual Wt: 365 lb, per pt % Usual Wt: 97%  Body mass index is 54.16  kg/(m^2).  Food/Nutrition Related Hx:  Pt reports approximate 10 lb weight loss, but states it was voluntary. Pt states appetite is good, follows no particular diet at home and eats what she wants.  Labs:  CMP     Component Value Date/Time   NA 136 05/24/2011 0627   K 5.0 05/24/2011 0627   CL 100 05/24/2011 0627   CO2 26 05/24/2011 0627   GLUCOSE 169* 05/24/2011 0627   BUN 12 05/24/2011 0627   CREATININE 0.72 05/24/2011 0627   CALCIUM 9.4 05/24/2011 0627   PROT 8.0 04/15/2011 0817   ALBUMIN 3.8 04/15/2011 0817   AST 33 04/15/2011 0817   ALT 33 04/15/2011 0817   ALKPHOS 90 04/15/2011 0817   BILITOT 0.3 04/15/2011 0817   GFRNONAA >90 05/24/2011 0627   GFRAA >90 05/24/2011 0627    CBG (last 3)   Basename 05/24/11 1135 05/24/11 0620 05/23/11 2209  GLUCAP 124* 163* 155*   Lab Results  Component Value Date   HGBA1C 6.1* 05/23/2011   HGBA1C  Value: 5.6 (NOTE)  According to the ADA Clinical Practice Recommendations for 2011, when HbA1c is used as a screening test:   >=6.5%   Diagnostic of Diabetes Mellitus           (if abnormal result  is confirmed)  5.7-6.4%   Increased risk of developing Diabetes Mellitus  References:Diagnosis and Classification of Diabetes Mellitus,Diabetes Care,2011,34(Suppl 1):S62-S69 and Standards of Medical Care in         Diabetes - 2011,Diabetes Care,2011,34  (Suppl 1):S11-S61. 11/10/2009   HGBA1C 5.6 08/17/2009    I/O last 3 completed shifts: In: 480 [P.O.:480] Out: 1200 [Urine:1200] Total I/O In: 120 [P.O.:120] Out: 200 [Urine:200]   Diet Order: Cardiac (60% PO intake)  IVF:     sodium chloride Last Rate: 20 mL/hr (05/23/11 2145)  DISCONTD: albuterol Last Rate: Stopped (05/23/11 1435)    Estimated Nutritional Needs:   Kcal: 2500- 2700, to promote gradual weight loss Protein: 115- 125 gm Fluid: >2.7 L  NUTRITION DIAGNOSIS: -Food and nutrition related knowledge deficit  (NB-1.1).  Status: Ongoing  RELATED TO: general healthy diet  AS EVIDENCE BY: pt comments regarding usual intake  MONITORING/EVALUATION(Goals): Goal: Pt to follow general healthy diet guidelines at home to promote healthy weight.  EDUCATION NEEDS: -Education needs addressed  INTERVENTION: Provided pt with handout regarding general healthy diet. Stressed importance of increasing non-starchy vegetables to promote fullness and monitoring portion sizes. Pt's questions were answered at this time. Encouraged pt to contact RD if future questions arise.   DOCUMENTATION CODES Per approved criteria  -Morbid Obesity    Leonette Most 05/24/2011, 11:50 AM

## 2011-05-25 DIAGNOSIS — E119 Type 2 diabetes mellitus without complications: Secondary | ICD-10-CM | POA: Diagnosis present

## 2011-05-25 LAB — GLUCOSE, CAPILLARY
Glucose-Capillary: 133 mg/dL — ABNORMAL HIGH (ref 70–99)
Glucose-Capillary: 103 mg/dL — ABNORMAL HIGH (ref 70–99)

## 2011-05-25 LAB — CBC
HCT: 38.4 % (ref 36.0–46.0)
Hemoglobin: 12.2 g/dL (ref 12.0–15.0)
WBC: 20.4 10*3/uL — ABNORMAL HIGH (ref 4.0–10.5)

## 2011-05-25 LAB — BASIC METABOLIC PANEL
BUN: 17 mg/dL (ref 6–23)
CO2: 27 mEq/L (ref 19–32)
Chloride: 103 mEq/L (ref 96–112)
Glucose, Bld: 126 mg/dL — ABNORMAL HIGH (ref 70–99)
Potassium: 4.4 mEq/L (ref 3.5–5.1)

## 2011-05-25 MED ORDER — ENOXAPARIN SODIUM 80 MG/0.8ML ~~LOC~~ SOLN
80.0000 mg | SUBCUTANEOUS | Status: DC
  Administered 2011-05-25 – 2011-05-27 (×3): 80 mg via SUBCUTANEOUS
  Filled 2011-05-25 (×4): qty 0.8

## 2011-05-25 MED ORDER — OXYCODONE HCL 5 MG PO TABS
5.0000 mg | ORAL_TABLET | ORAL | Status: DC | PRN
  Administered 2011-05-25 – 2011-05-26 (×2): 5 mg via ORAL
  Filled 2011-05-25 (×2): qty 1

## 2011-05-25 MED ORDER — HYDROCOD POLST-CHLORPHEN POLST 10-8 MG/5ML PO LQCR
5.0000 mL | Freq: Two times a day (BID) | ORAL | Status: AC
  Administered 2011-05-25 – 2011-05-28 (×6): 5 mL via ORAL
  Filled 2011-05-25 (×6): qty 5

## 2011-05-25 MED ORDER — GUAIFENESIN ER 600 MG PO TB12
600.0000 mg | ORAL_TABLET | Freq: Two times a day (BID) | ORAL | Status: DC
  Administered 2011-05-25 – 2011-05-28 (×7): 600 mg via ORAL
  Filled 2011-05-25 (×8): qty 1

## 2011-05-25 NOTE — Progress Notes (Signed)
45yo female on Lovenox 40mg  SQ qHS for VTE prophylaxis.  Pt has BMI > 30 and good renal fxn.  Will adjust dose to 0.5mg /kg q24.

## 2011-05-25 NOTE — Progress Notes (Signed)
Clinical Social Work, 05/25/11, 1525:  Please see shadow chart for full assessment.  Pt had financial concerns.  CSW gave patient resources and information and informed case manager to assist with medication assistance if possible.  Patient states she has already spoken with financial counselor.  Patient has no further needs so CSW will sign off.  Please inform of any further needs.  Beth [name], [place], [place], #[phone]

## 2011-05-25 NOTE — Progress Notes (Signed)
PATIENT DETAILS Name: Beth [name] Age: 45 y.o. Sex: female Date of Birth: 17-Oct-1965 Admit Date: 05/23/2011 PCP: [facility]  CONSULTS: 1.  Dietician    Interval History: Beth [name] is a 45 year old female admitted on 05/23/2011 with dyspnea and cough. She is being treated for an acute asthma exacerbation.  ROS: Beth [name] reports ongoing problems with dyspnea and cough. She states she is having upper abdominal pain related to the paroxysms of coughing.   Objective: Vital signs in last 24 hours: Temp:  [97.9 F (36.6 C)-98.2 F (36.8 C)] 97.9 F (36.6 C) (11/30 0500) Pulse Rate:  [76-92] 76  (11/30 0500) Resp:  [18] 18  (11/30 0500) BP: (125-148)/(80-90) 125/84 mmHg (11/30 0500) SpO2:  [91 %-94 %] 94 % (11/30 0816) Weight:  [161 kg (354 lb 15.1 oz)] 354 lb 15.1 oz (161 kg) (11/30 0500) Weight change: -1.252 kg (-2 lb 12.2 oz) Last BM Date: 05/22/11  Intake/Output from previous day:  Intake/Output Summary (Last 24 hours) at 05/25/11 0956 Last data filed at 05/24/11 2127  Gross per 24 hour  Intake    660 ml  Output    550 ml  Net    110 ml     Physical Exam:  Gen:  No acute distress. Cardiovascular:  Heart sounds regular. No murmurs, rubs, or gallops. Respiratory: Decreased air movement and expiratory wheezes. Crackles at the left base. Gastrointestinal: Abdomen soft, nontender, nondistended with normal active bowel sounds. Extremities: No clubbing, edema, or cyanosis.   Lab Results: Basic Metabolic Panel:  Lab 05/25/11 5409 05/24/11 0627 05/23/11 2054 05/23/11 0952  NA 142 136 -- 140  K 4.4 5.0 -- --  CL 103 100 -- 101  CO2 27 26 -- 27  GLUCOSE 126* 169* -- 106*  BUN 17 12 -- 8  CREATININE 0.80 0.72 0.83 0.84  CALCIUM 9.4 9.4 -- 9.0  MG -- -- -- --  PHOS -- -- -- --   GFR Estimated Creatinine Clearance: 142.5 ml/min (by C-G formula based on Cr of 0.8).  CBC:  Lab 05/25/11 0455 05/24/11 0627 05/23/11 2054 05/23/11 0952  WBC 20.4* 15.6* 12.5*  13.0*  NEUTROABS -- -- -- 8.5*  HGB 12.2 12.2 12.4 12.5  HCT 38.4 39.7 38.5 38.7  MCV 89.1 89.0 88.5 89.0  PLT 350 313 326 299   CBG:  Lab 05/25/11 0627 05/24/11 2246 05/24/11 1828 05/24/11 1135 05/24/11 0620  GLUCAP 133* 135* 182* 124* 163*  Hgb A1c  Basename 05/23/11 1822  HGBA1C 6.1*    Recent Results (from the past 240 hour(s))  MRSA PCR SCREENING     Status: Normal   Collection Time   05/24/11  7:32 PM      Component Value Range Status Comment   MRSA by PCR NEGATIVE  NEGATIVE  Final     Studies/Results: Dg Chest Port 1 View  05/23/2011  *RADIOLOGY REPORT*  Clinical Data: Shortness of breath, asthma.  PORTABLE CHEST - 1 VIEW  Comparison: 04/15/2011  Findings: There is peribronchial thickening and diffuse interstitial prominence, similar prior study.  Mild cardiomegaly and vascular congestion.  No overt edema or confluent opacities. No effusions.  No acute bony abnormality.  IMPRESSION: Chronic peribronchial thickening, likely related to patient's asthma.  Borderline cardiomegaly.  Original Report Authenticated By: Cyndie Chime, M.D.    Medications: Scheduled Meds:   . albuterol  2.5 mg Nebulization QID  . azithromycin  500 mg Intravenous Q24H  . buPROPion  150 mg Oral Daily  .  cefTRIAXone (ROCEPHIN)  IV  1 g Intravenous Q24H  . enoxaparin (LOVENOX) injection  80 mg Subcutaneous Q24H  . gabapentin  600 mg Oral BID  . insulin aspart  0-15 Units Subcutaneous TID WC  . ipratropium  0.5 mg Nebulization QID  . lisinopril  10 mg Oral Daily  . methylPREDNISolone (SOLU-MEDROL) injection  80 mg Intravenous TID  . metoprolol  100 mg Oral Daily  . pantoprazole  40 mg Oral Daily  . polyethylene glycol  17 g Oral Daily  . risperiDONE  2 mg Oral QHS  . simvastatin  20 mg Oral q1800  . DISCONTD: albuterol  2.5 mg Nebulization Q6H  . DISCONTD: enoxaparin  40 mg Subcutaneous Q24H  . DISCONTD: ipratropium  0.5 mg Nebulization Q6H   Continuous Infusions:   . sodium chloride  20 mL/hr (05/23/11 2145)   PRN Meds:.acetaminophen, acetaminophen, albuterol, alum & mag hydroxide-simeth, guaiFENesin-dextromethorphan, loratadine, ondansetron (ZOFRAN) IV, ondansetron, promethazine, promethazine, senna, sodium phosphate Antibiotics: Anti-infectives     Start     Dose/Rate Route Frequency Ordered Stop   05/24/11 1900   cefTRIAXone (ROCEPHIN) 1 g in dextrose 5 % 50 mL IVPB  Status:  Discontinued        1 g 100 mL/hr over 30 Minutes Intravenous Every 24 hours 05/23/11 1954 05/23/11 2100   05/24/11 1900   azithromycin (ZITHROMAX) 500 mg in dextrose 5 % 250 mL IVPB        500 mg 250 mL/hr over 60 Minutes Intravenous Every 24 hours 05/23/11 1954     05/23/11 2130   cefTRIAXone (ROCEPHIN) 1 g in dextrose 5 % 50 mL IVPB        1 g 100 mL/hr over 30 Minutes Intravenous Every 24 hours 05/23/11 2100     05/23/11 1915   azithromycin (ZITHROMAX) 500 mg in dextrose 5 % 250 mL IVPB        500 mg 250 mL/hr over 60 Minutes [facility] 05/23/11 1909 05/23/11 2244   05/23/11 1915   cefTRIAXone (ROCEPHIN) 1 g in dextrose 5 % 50 mL IVPB  Status:  Discontinued        1 g 100 mL/hr over 30 Minutes Intra[facility]ous [facility] 05/23/11 1909 05/23/11 2100           Assessment/Plan:  Principal Problem:  *Asthma with exacerbation Assessment: Ongoing bronchospasm. Ongoing cough. Plan: Continue current dose of steroids. Do not taper yet. Add Mucinex and potassium next for better cough control. Continue bronchodilator therapy. Continue empiric antibiotics with Rocephin and 8 azithromycin, currently day #3. Active Problems:  HYPERLIPIDEMIA Assessment: Stable Plan: Continue with Zocor. Further outpatient workup.   OBESITY, NOS Assessment: Has had weight gain. Plan: Seen by the dietitian and counseled regarding appropriate diet for weight loss.  HYPERTENSION, BENIGN SYSTEMIC Assessment: Controlled  Plan: Continue with lisinopril and metoprolol. However  if she still has bronchospasm, then we'll need to consider stopping metoprolol.   Depression Assessment: Stable  Plan: Continue with Risperdal and Wellbutrin.   Neuropathy Assessment: Stable  Plan: Continue with Neurontin DM-2 Assessment: Controlled, HbA1c 6.1  Plan: Continue with SSI for now, resume metformin on discharge.     LOS: 2 days   Hillery Aldo, MD Pager [phone]  05/25/2011, 9:56 AM

## 2011-05-25 NOTE — Progress Notes (Signed)
Spoke with patient about PCP, she is active with [facility] and has appt 06/07/11 at 11:00 with Dr. Andrey Campanile. She gets her meds from [facility]. Checked with pharmacy and she is eligible for indigent fund. Gave patient a discount pharmacy card. CM will continue to follow.

## 2011-05-26 LAB — BASIC METABOLIC PANEL
BUN: 21 mg/dL (ref 6–23)
Chloride: 102 mEq/L (ref 96–112)
Creatinine, Ser: 0.85 mg/dL (ref 0.50–1.10)
GFR calc Af Amer: >90 mL/min (ref >90)
GFR calc non Af Amer: 81 mL/min — ABNORMAL LOW (ref >90)

## 2011-05-26 LAB — CBC
HCT: 37.3 % (ref 36.0–46.0)
MCH: 28.2 pg (ref 26.0–34.0)
MCHC: 32.2 g/dL (ref 30.0–36.0)
MCV: 87.8 fL (ref 78.0–100.0)
Platelets: 353 10*3/uL (ref 150–400)
RDW: 16.1 % — ABNORMAL HIGH (ref 11.5–15.5)
WBC: 18.3 10*3/uL — ABNORMAL HIGH (ref 4.0–10.5)

## 2011-05-26 LAB — GLUCOSE, CAPILLARY
Glucose-Capillary: 118 mg/dL — ABNORMAL HIGH (ref 70–99)
Glucose-Capillary: 114 mg/dL — ABNORMAL HIGH (ref 70–99)
Glucose-Capillary: 106 mg/dL — ABNORMAL HIGH (ref 70–99)

## 2011-05-26 MED ORDER — METHYLPREDNISOLONE SODIUM SUCC 125 MG IJ SOLR
80.0000 mg | INTRAMUSCULAR | Status: DC
  Administered 2011-05-26: 22:00:00 via INTRAVENOUS
  Filled 2011-05-26: qty 1.28

## 2011-05-26 MED ORDER — BENZONATATE 100 MG PO CAPS
200.0000 mg | ORAL_CAPSULE | Freq: Three times a day (TID) | ORAL | Status: DC
  Administered 2011-05-26 – 2011-05-28 (×7): 200 mg via ORAL
  Filled 2011-05-26 (×9): qty 2

## 2011-05-26 MED ORDER — ALBUTEROL SULFATE HFA 108 (90 BASE) MCG/ACT IN AERS
2.0000 | INHALATION_SPRAY | Freq: Four times a day (QID) | RESPIRATORY_TRACT | Status: DC
  Administered 2011-05-26 – 2011-05-28 (×9): 2 via RESPIRATORY_TRACT
  Filled 2011-05-26: qty 6.7

## 2011-05-26 NOTE — Progress Notes (Signed)
PATIENT DETAILS Name: Beth [name] Age: 45 y.o. Sex: female Date of Birth: [birth year] Admit Date: 05/23/2011 PCP: [facility]  CONSULTS: 1.  Dietician    Interval History: [name] is a 45 year old female admitted on 05/23/2011 with dyspnea and cough. She is being treated for an acute asthma exacerbation.  ROS: [name] reports ongoing problems with dyspnea and cough.  Objective: Vital signs in last 24 hours: Temp:  [97.5 F (36.4 C)-98.4 F (36.9 C)] 97.5 F (36.4 C) (12/01 1003) Pulse Rate:  [66-77] 77  (12/01 1003) Resp:  [20-24] 20  (12/01 1003) BP: (116-126)/(72-86) 116/72 mmHg (12/01 1003) SpO2:  [89 %-97 %] 89 % (12/01 1557) Weight:  [160.3 kg (353 lb 6.4 oz)] 353 lb 6.4 oz (160.3 kg) (12/01 0452) Weight change: -0.7 kg (-1 lb 8.7 oz) Last BM Date: 05/24/11  Intake/Output from previous day:  Intake/Output Summary (Last 24 hours) at 05/26/11 1707 Last data filed at 05/26/11 1300  Gross per 24 hour  Intake   2505 ml  Output    150 ml  Net   2355 ml     Physical Exam:  Gen:  No acute distress. Cardiovascular:  Heart sounds regular. No murmurs, rubs, or gallops. Respiratory: Decreased air movement and expiratory wheezes.  Gastrointestinal: Abdomen soft, nontender, nondistended with normal active bowel sounds. Extremities: No clubbing, edema, or cyanosis.  PFR : 200 Lab Results: Basic Metabolic Panel:  Lab 05/26/11 1610 05/25/11 0455 05/24/11 0627 05/23/11 2054 05/23/11 0952  NA 141 142 136 -- 140  K 4.2 4.4 -- -- --  CL 102 103 100 -- 101  CO2 28 27 26  -- 27  GLUCOSE 124* 126* 169* -- 106*  BUN 21 17 12  -- 8  CREATININE 0.85 0.80 0.72 0.83 0.84  CALCIUM 9.2 9.4 9.4 -- 9.0  MG -- -- -- -- --  PHOS -- -- -- -- --   GFR Estimated Creatinine Clearance: 133.8 ml/min (by C-G formula based on Cr of 0.85).  CBC:  Lab 05/26/11 0500 05/25/11 0455 05/24/11 0627 05/23/11 2054 05/23/11 0952  WBC 18.3* 20.4* 15.6* 12.5* 13.0*  NEUTROABS -- -- -- --  8.5*  HGB 12.0 12.2 12.2 12.4 12.5  HCT 37.3 38.4 39.7 38.5 38.7  MCV 87.8 89.1 89.0 88.5 89.0  PLT 353 350 313 326 299   CBG:  Lab 05/26/11 1624 05/26/11 1112 05/26/11 0620 05/25/11 2239 05/25/11 1622  GLUCAP 114* 95 118* 143* 122*  Hgb A1c  Basename 05/23/11 1822  HGBA1C 6.1*    Recent Results (from the past 240 hour(s))  MRSA PCR SCREENING     Status: Normal   Collection Time   05/24/11  7:32 PM      Component Value Range Status Comment   MRSA by PCR NEGATIVE  NEGATIVE  Final     Studies/Results: No results found.  Medications: Scheduled Meds:    . albuterol  2 puff Inhalation QID  . azithromycin  500 mg Intravenous Q24H  . benzonatate  200 mg Oral TID  . buPROPion  150 mg Oral Daily  . cefTRIAXone (ROCEPHIN)  IV  1 g Intravenous Q24H  . chlorpheniramine-HYDROcodone  5 mL Oral Q12H  . enoxaparin (LOVENOX) injection  80 mg Subcutaneous Q24H  . gabapentin  600 mg Oral BID  . guaiFENesin  600 mg Oral BID  . insulin aspart  0-15 Units Subcutaneous TID WC  . methylPREDNISolone (SOLU-MEDROL) injection  80 mg Intravenous Q24H  . metoprolol  100 mg Oral  Daily  . pantoprazole  40 mg Oral Daily  . polyethylene glycol  17 g Oral Daily  . risperiDONE  2 mg Oral QHS  . simvastatin  20 mg Oral q1800  . DISCONTD: albuterol  2.5 mg Nebulization QID  . DISCONTD: ipratropium  0.5 mg Nebulization QID  . DISCONTD: lisinopril  10 mg Oral Daily  . DISCONTD: methylPREDNISolone (SOLU-MEDROL) injection  80 mg Intravenous TID   Continuous Infusions:    . DISCONTD: sodium chloride 20 mL/hr (05/23/11 2145)   PRN Meds:.acetaminophen, acetaminophen, albuterol, alum & mag hydroxide-simeth, guaiFENesin-dextromethorphan, loratadine, ondansetron (ZOFRAN) IV, ondansetron, oxyCODONE, promethazine, promethazine, senna, sodium phosphate Antibiotics: Anti-infectives     Start     Dose/Rate Route Frequency Ordered Stop   05/24/11 1900   cefTRIAXone (ROCEPHIN) 1 g in dextrose 5 % 50 mL IVPB   Status:  Discontinued        1 g 100 mL/hr over 30 Minutes Intravenous Every 24 hours 05/23/11 1954 05/23/11 2100   05/24/11 1900   azithromycin (ZITHROMAX) 500 mg in dextrose 5 % 250 mL IVPB        500 mg 250 mL/hr over 60 Minutes Intravenous Every 24 hours 05/23/11 1954     05/23/11 2130   cefTRIAXone (ROCEPHIN) 1 g in dextrose 5 % 50 mL IVPB        1 g 100 mL/hr over 30 Minutes Intravenous Every 24 hours 05/23/11 2100     05/23/11 1915   azithromycin (ZITHROMAX) 500 mg in dextrose 5 % 250 mL IVPB        500 mg 250 mL/hr over 60 Minutes [facility] 05/23/11 1909 05/23/11 2244   05/23/11 1915   cefTRIAXone (ROCEPHIN) 1 g in dextrose 5 % 50 mL IVPB  Status:  Discontinued        1 g 100 mL/hr over 30 Minutes Intra[facility]ous [facility] 05/23/11 1909 05/23/11 2100           Assessment/Plan:  Principal Problem:  *Asthma with exacerbation Assessment: Ongoing bronchospasm. Ongoing cough. Plan: Continue current dose of steroids. Do not taper yet. Add tessalon perles  for better cough control. Continue bronchodilator therapy. Continue empiric antibiotics with Rocephin and 8 azithromycin, currently day #3.DC ACEI and atrovent to help with cough Active Problems:  HYPERLIPIDEMIA Assessment: Stable Plan: Continue with Zocor. Further outpatient workup.   OBESITY, NOS Assessment: Has had weight gain. Plan: Seen by the dietitian and counseled regarding appropriate diet for weight loss.  HYPERTENSION, BENIGN SYSTEMIC Assessment: Controlled  Plan: Continue with  metoprolol.    Depression Assessment: Stable  Plan: Continue with Risperdal and Wellbutrin.   Neuropathy Assessment: Stable  Plan: Continue with Neurontin DM-2 Assessment: Controlled, HbA1c 6.1  Plan: Continue with SSI for now, resume metformin on discharge.     LOS: 3 days   Amelita Risinger  05/26/2011, 5:07 PM

## 2011-05-27 LAB — GLUCOSE, CAPILLARY
Glucose-Capillary: 131 mg/dL — ABNORMAL HIGH (ref 70–99)
Glucose-Capillary: 141 mg/dL — ABNORMAL HIGH (ref 70–99)

## 2011-05-27 MED ORDER — PREDNISONE 50 MG PO TABS
50.0000 mg | ORAL_TABLET | Freq: Two times a day (BID) | ORAL | Status: DC
  Administered 2011-05-27 – 2011-05-28 (×3): 50 mg via ORAL
  Filled 2011-05-27 (×5): qty 1

## 2011-05-27 NOTE — Progress Notes (Signed)
PATIENT DETAILS Name: Beth [name] Age: 45 y.o. Sex: female Date of Birth: 04-27-1966 Admit Date: 05/23/2011 PCP: [facility]  Interval History: Beth [name] is a 45 year old female admitted on 05/23/2011 with dyspnea and cough. She is being treated for an acute asthma exacerbation.  ROS: Beth [name] reports ongoing problems with dyspnea and cough.  Objective: Vital signs in last 24 hours: Temp:  [98 F (36.7 C)-98.9 F (37.2 C)] 98 F (36.7 C) (12/02 1430) Pulse Rate:  [62-66] 66  (12/02 1430) Resp:  [16-18] 18  (12/02 1430) BP: (107-130)/(64-79) 119/77 mmHg (12/02 1430) SpO2:  [93 %-96 %] 94 % (12/02 1608) Weight:  [160.256 kg (353 lb 4.8 oz)] 353 lb 4.8 oz (160.256 kg) (12/02 0428) Weight change: -0.044 kg (-1.6 oz) Last BM Date: 05/27/11  Intake/Output from previous day:  Intake/Output Summary (Last 24 hours) at 05/27/11 1834 Last data filed at 05/27/11 1500  Gross per 24 hour  Intake    790 ml  Output   2351 ml  Net  -1561 ml     Physical Exam:  Gen:  No acute distress. Cardiovascular:  Heart sounds regular. No murmurs, rubs, or gallops. Respiratory: Decreased air movement and expiratory wheezes.  Gastrointestinal: Abdomen soft, nontender, nondistended with normal active bowel sounds. Extremities: No clubbing, edema, or cyanosis.  PFR : 200 Lab Results: Basic Metabolic Panel:  Lab 05/26/11 1610 05/25/11 0455 05/24/11 0627 05/23/11 2054 05/23/11 0952  NA 141 142 136 -- 140  K 4.2 4.4 -- -- --  CL 102 103 100 -- 101  CO2 28 27 26  -- 27  GLUCOSE 124* 126* 169* -- 106*  BUN 21 17 12  -- 8  CREATININE 0.85 0.80 0.72 0.83 0.84  CALCIUM 9.2 9.4 9.4 -- 9.0  MG -- -- -- -- --  PHOS -- -- -- -- --   GFR Estimated Creatinine Clearance: 133.8 ml/min (by C-G formula based on Cr of 0.85).  CBC:  Lab 05/26/11 0500 05/25/11 0455 05/24/11 0627 05/23/11 2054 05/23/11 0952  WBC 18.3* 20.4* 15.6* 12.5* 13.0*  NEUTROABS -- -- -- -- 8.5*  HGB 12.0 12.2 12.2 12.4  12.5  HCT 37.3 38.4 39.7 38.5 38.7  MCV 87.8 89.1 89.0 88.5 89.0  PLT 353 350 313 326 299   CBG:  Lab 05/27/11 1605 05/27/11 1155 05/27/11 0616 05/26/11 2204 05/26/11 1624  GLUCAP 131* 85 140* 106* 114*  Hgb A1c No results found for this basename: HGBA1C:2 in the last 72 hours  Recent Results (from the past 240 hour(s))  MRSA PCR SCREENING     Status: Normal   Collection Time   05/24/11  7:32 PM      Component Value Range Status Comment   MRSA by PCR NEGATIVE  NEGATIVE  Final     Studies/Results: No results found.  Medications: Scheduled Meds:    . albuterol  2 puff Inhalation QID  . azithromycin  500 mg Intravenous Q24H  . benzonatate  200 mg Oral TID  . buPROPion  150 mg Oral Daily  . cefTRIAXone (ROCEPHIN)  IV  1 g Intravenous Q24H  . chlorpheniramine-HYDROcodone  5 mL Oral Q12H  . enoxaparin (LOVENOX) injection  80 mg Subcutaneous Q24H  . gabapentin  600 mg Oral BID  . guaiFENesin  600 mg Oral BID  . insulin aspart  0-15 Units Subcutaneous TID WC  . metoprolol  100 mg Oral Daily  . pantoprazole  40 mg Oral Daily  . polyethylene glycol  17 g Oral  Daily  . predniSONE  50 mg Oral BID WC  . risperiDONE  2 mg Oral QHS  . simvastatin  20 mg Oral q1800  . DISCONTD: methylPREDNISolone (SOLU-MEDROL) injection  80 mg Intravenous Q24H   Continuous Infusions:   PRN Meds:.acetaminophen, acetaminophen, albuterol, alum & mag hydroxide-simeth, guaiFENesin-dextromethorphan, loratadine, ondansetron (ZOFRAN) IV, ondansetron, oxyCODONE, promethazine, promethazine, senna, sodium phosphate Antibiotics: Anti-infectives     Start     Dose/Rate Route Frequency Ordered Stop   05/24/11 1900   cefTRIAXone (ROCEPHIN) 1 g in dextrose 5 % 50 mL IVPB  Status:  Discontinued        1 g 100 mL/hr over 30 Minutes Intravenous Every 24 hours 05/23/11 1954 05/23/11 2100   05/24/11 1900   azithromycin (ZITHROMAX) 500 mg in dextrose 5 % 250 mL IVPB        500 mg 250 mL/hr over 60 Minutes  Intravenous Every 24 hours 05/23/11 1954     05/23/11 2130   cefTRIAXone (ROCEPHIN) 1 g in dextrose 5 % 50 mL IVPB        1 g 100 mL/hr over 30 Minutes Intravenous Every 24 hours 05/23/11 2100     05/23/11 1915   azithromycin (ZITHROMAX) 500 mg in dextrose 5 % 250 mL IVPB        500 mg 250 mL/hr over 60 Minutes Intra[facility]ous [facility] 05/23/11 1909 05/23/11 2244   05/23/11 1915   cefTRIAXone (ROCEPHIN) 1 g in dextrose 5 % 50 mL IVPB  Status:  Discontinued        1 g 100 mL/hr over 30 Minutes Intra[facility] 05/23/11 1909 05/23/11 2100           Assessment/Plan:  Asthma with exacerbation Assessment: improved with decrease coughing. Plan: taper steroids.  Continue empiric antibiotics with Rocephin and  azithromycin, currently day #4.DCed ACEI and atrovent to help with cough   HYPERLIPIDEMIA Assessment: Stable Plan: Continue with Zocor. Further outpatient workup.   OBESITY, NOS Assessment: Has had weight gain. Plan: Seen by the dietitian and counseled regarding appropriate diet for weight loss.  HYPERTENSION, BENIGN SYSTEMIC Assessment: Controlled  Plan: Continue with  metoprolol.    Depression Assessment: Stable  Plan: Continue with Risperdal and Wellbutrin.   Neuropathy Assessment: Stable  Plan: Continue with Neurontin DM-2 Assessment: Controlled, HbA1c 6.1  Plan: Continue with SSI for now, resume metformin on discharge.     LOS: 4 days   Beth [name]  05/27/2011, 6:34 PM

## 2011-05-28 LAB — BASIC METABOLIC PANEL
CO2: 27 mEq/L (ref 19–32)
Calcium: 8.9 mg/dL (ref 8.4–10.5)
GFR calc non Af Amer: 75 mL/min — ABNORMAL LOW (ref >90)
Glucose, Bld: 113 mg/dL — ABNORMAL HIGH (ref 70–99)
Potassium: 3.8 mEq/L (ref 3.5–5.1)
Sodium: 139 mEq/L (ref 135–145)

## 2011-05-28 LAB — GLUCOSE, CAPILLARY: Glucose-Capillary: 104 mg/dL — ABNORMAL HIGH (ref 70–99)

## 2011-05-28 LAB — CBC
Hemoglobin: 12.2 g/dL (ref 12.0–15.0)
MCH: 28.6 pg (ref 26.0–34.0)
Platelets: 313 10*3/uL (ref 150–400)
RBC: 4.26 MIL/uL (ref 3.87–5.11)

## 2011-05-28 MED ORDER — BENZONATATE 200 MG PO CAPS: 200.0000 mg | ORAL_CAPSULE | Freq: Three times a day (TID) | ORAL | Status: AC | PRN

## 2011-05-28 MED ORDER — PREDNISONE 20 MG PO TABS: ORAL_TABLET | ORAL | Status: AC

## 2011-05-28 MED ORDER — DOXYCYCLINE HYCLATE 50 MG PO CAPS: 100.0000 mg | ORAL_CAPSULE | Freq: Two times a day (BID) | ORAL | Status: AC

## 2011-05-28 NOTE — Discharge Summary (Signed)
Physician Discharge Summary  Patient ID: Beth [name] MRN: [id number] DOB/AGE: Nov 16, 1965 45 y.o. Primary Care Physician:WILSON,AMELIA, MD, MD Admit date: 05/23/2011 Discharge date: 05/28/2011   Discharge Diagnoses:  Asthma with exacerbation HYPERLIPIDEMIA OBESITY, NOS HYPERTENSION, BENIGN SYSTEMIC Depression Neuropathy DM type 2 (diabetes mellitus, type 2)   Discharge Medication List as of 05/28/2011  1:42 PM    START taking these medications   Details  benzonatate (TESSALON) 200 MG capsule Take 1 capsule (200 mg total) by mouth 3 (three) times daily as needed for cough., Starting 05/28/2011, Until Mon 06/04/11, Print    doxycycline (VIBRAMYCIN) 50 MG capsule Take 2 capsules (100 mg total) by mouth 2 (two) times daily., Starting 05/28/2011, Until Thu 06/07/11, Print    predniSONE (DELTASONE) 20 MG tablet 3/day for 3 days then 2/day for 3 days then 1/day for 3 days then stop, Print      CONTINUE these medications which have NOT CHANGED   Details  albuterol (PROVENTIL HFA;VENTOLIN HFA) 108 (90 BASE) MCG/ACT inhaler Inhale 2 puffs into the lungs every 4 (four) hours as needed. For shortness of breath , Until Discontinued, Historical Med    buPROPion (WELLBUTRIN XL) 150 MG 24 hr tablet Take 150 mg by mouth daily.  , Until Discontinued, Historical Med    Fluticasone-Salmeterol (ADVAIR) 250-50 MCG/DOSE AEPB Inhale 1 puff into the lungs 2 (two) times daily as needed. For shortness of breath , Until Discontinued, Historical Med    gabapentin (NEURONTIN) 600 MG tablet Take 600 mg by mouth 2 (two) times daily.  , Until Discontinued, Historical Med    loratadine (CLARITIN) 10 MG tablet Take 10 mg by mouth daily.  , Until Discontinued, Historical Med    metFORMIN (GLUCOPHAGE) 500 MG tablet Take 500 mg by mouth 2 (two) times daily with a meal.  , Until Discontinued, Historical Med    metoprolol (TOPROL-XL) 100 MG 24 hr tablet Take 100 mg by mouth daily.  , Until Discontinued, Historical  Med    pantoprazole (PROTONIX) 40 MG tablet Take 40 mg by mouth daily.  , Until Discontinued, Historical Med    pravastatin (PRAVACHOL) 40 MG tablet Take 40 mg by mouth daily.  , Until Discontinued, Historical Med    risperiDONE (RISPERDAL) 2 MG tablet Take 2 mg by mouth at bedtime.  , Until Discontinued, Historical Med      STOP taking these medications     lisinopril (PRINIVIL,ZESTRIL) 10 MG tablet      PRESCRIPTION MEDICATION         Discharged Condition: Good, Peak Flow Rate 350    Consults: none  Significant Diagnostic Studies: Dg Chest Port 1 View  05/23/2011  *RADIOLOGY REPORT*  Clinical Data: Shortness of breath, asthma.  PORTABLE CHEST - 1 VIEW  Comparison: 04/15/2011  Findings: There is peribronchial thickening and diffuse interstitial prominence, similar prior study.  Mild cardiomegaly and vascular congestion.  No overt edema or confluent opacities. No effusions.  No acute bony abnormality.  IMPRESSION: Chronic peribronchial thickening, likely related to patient's asthma.  Borderline cardiomegaly.  Original Report Authenticated By: Cyndie Chime, M.D.    Lab Results: Basic Metabolic Panel:  Basename 05/28/11 0545 05/26/11 0500  NA 139 141  K 3.8 4.2  CL 103 102  CO2 27 28  GLUCOSE 113* 124*  BUN 22 21  CREATININE 0.91 0.85  CALCIUM 8.9 9.2  MG -- --  PHOS -- --   Liver Function Tests: No results found for this basename: AST:2,ALT:2,ALKPHOS:2,BILITOT:2,PROT:2,ALBUMIN:2 in the last 72  hours   CBC:  Basename 05/28/11 0545 05/26/11 0500  WBC 18.9* 18.3*  NEUTROABS -- --  HGB 12.2 12.0  HCT 36.9 37.3  MCV 86.6 87.8  PLT 313 353    Recent Results (from the past 240 hour(s))  MRSA PCR SCREENING     Status: Normal   Collection Time   05/24/11  7:32 PM      Component Value Range Status Comment   MRSA by PCR NEGATIVE  NEGATIVE  Final      Hospital Course:  Patient is a 45 year old African American female with a past medical history of bronchial  asthma, depression, hypertension, diabetes who presented to the hospital on 05/23/11 with progressive dyspnea for 2 weeks. She was admitted for asthma exacerbation. She was treated with bronchodilators, iv steroids and antibiotics. We also provided good symptomatic treatment for cough. We stopped the lisinopril as well. She had a course of progressive improvement. We changed to oral prednisone and patient was observed for 24 more hours with continuous improvement.   Discharge Exam: Blood pressure 159/91, pulse 68, temperature 97.8 F (36.6 C), temperature source Oral, resp. rate 18, height 5\' 8"  (1.727 m), weight 159.439 kg (351 lb 8 oz), SpO2 93.00%. Alert and oriented CVS: RRR RS: prolonged expiratory phase but very minimal wheezes.  Abdomen: soft, NT LE no edema Disposition: home  Discharge Orders    Future Orders Please Complete By Expires   Diet Carb Modified      Increase activity slowly         Follow-up Information    Follow up with [facility] on 06/07/2011. (appt with Dr.Wilson at 11:00)    Contact information:   [phone]         Signed: Mcarthur [name] 05/28/2011, 5:50 PM

## 2012-02-28 ENCOUNTER — Emergency Department (HOSPITAL_COMMUNITY)
Admission: EM | Admit: 2012-02-28 | Discharge: 2012-02-28 | Disposition: A | Discharge status: Home / Self Care | Payer: Self-pay | Attending: Emergency Medicine | Admitting: Emergency Medicine

## 2012-02-28 ENCOUNTER — Encounter (HOSPITAL_COMMUNITY): Payer: Self-pay

## 2012-02-28 DIAGNOSIS — M25569 Pain in unspecified knee: Secondary | ICD-10-CM | POA: Insufficient documentation

## 2012-02-28 DIAGNOSIS — Z87891 Personal history of nicotine dependence: Secondary | ICD-10-CM | POA: Insufficient documentation

## 2012-02-28 DIAGNOSIS — J45909 Unspecified asthma, uncomplicated: Secondary | ICD-10-CM | POA: Insufficient documentation

## 2012-02-28 DIAGNOSIS — I2510 Atherosclerotic heart disease of native coronary artery without angina pectoris: Secondary | ICD-10-CM | POA: Insufficient documentation

## 2012-02-28 DIAGNOSIS — E785 Hyperlipidemia, unspecified: Secondary | ICD-10-CM | POA: Insufficient documentation

## 2012-02-28 DIAGNOSIS — M129 Arthropathy, unspecified: Secondary | ICD-10-CM | POA: Insufficient documentation

## 2012-02-28 DIAGNOSIS — M25562 Pain in left knee: Secondary | ICD-10-CM

## 2012-02-28 DIAGNOSIS — G8929 Other chronic pain: Secondary | ICD-10-CM | POA: Insufficient documentation

## 2012-02-28 DIAGNOSIS — E119 Type 2 diabetes mellitus without complications: Secondary | ICD-10-CM | POA: Insufficient documentation

## 2012-02-28 DIAGNOSIS — I10 Essential (primary) hypertension: Secondary | ICD-10-CM | POA: Insufficient documentation

## 2012-02-28 DIAGNOSIS — M549 Dorsalgia, unspecified: Secondary | ICD-10-CM

## 2012-02-28 MED ORDER — OXYCODONE-ACETAMINOPHEN 5-325 MG PO TABS: 1.0000 | ORAL_TABLET | ORAL | Status: AC | PRN

## 2012-02-28 MED ORDER — IBUPROFEN 400 MG PO TABS
800.0000 mg | ORAL_TABLET | Freq: Once | ORAL | Status: AC
  Administered 2012-02-28: 800 mg via ORAL
  Filled 2012-02-28: qty 2

## 2012-02-28 MED ORDER — ALBUTEROL SULFATE (5 MG/ML) 0.5% IN NEBU
5.0000 mg | INHALATION_SOLUTION | Freq: Once | RESPIRATORY_TRACT | Status: AC
  Administered 2012-02-28: 5 mg via RESPIRATORY_TRACT
  Filled 2012-02-28: qty 1

## 2012-02-28 MED ORDER — MELOXICAM 7.5 MG PO TABS: 15.0000 mg | ORAL_TABLET | Freq: Every day | ORAL | Status: DC

## 2012-02-28 MED ORDER — ALBUTEROL SULFATE HFA 108 (90 BASE) MCG/ACT IN AERS
2.0000 | INHALATION_SPRAY | RESPIRATORY_TRACT | Status: DC | PRN
  Filled 2012-02-28: qty 6.7

## 2012-02-28 NOTE — ED Notes (Signed)
Pt reports lower back pain and BLE pain x2 days, pt also reports she feels like her asthma is "acting up." audible wheezing noted in triage.

## 2012-02-28 NOTE — ED Notes (Signed)
Pt states "I have a throbbing in my knees I can't get rid of." Left>right. Pt states her lower back hurts too. Pt states she was wheezing earlier but is feeling better.

## 2012-02-28 NOTE — ED Notes (Signed)
Pt d/c home in NAD. Pt voiced understanding of d/c instructions and follow up care.  

## 2012-02-28 NOTE — ED Provider Notes (Signed)
History     CSN: [id number]  Arrival date & time 02/28/12  1949   First MD Initiated Contact with Patient 02/28/12 2152      Chief Complaint  Patient presents with  . Leg Pain  . Asthma    (Consider location/radiation/quality/duration/timing/severity/associated sxs/prior treatment) Patient is a 46 y.o. female presenting with asthma, knee pain, and back pain. The history is provided by the patient. No language interpreter was used.  Asthma This is a chronic problem. The current episode started today. The problem has been resolved. Pertinent negatives include no coughing, fever, joint swelling, nausea, numbness, vomiting or weakness. Nothing aggravates the symptoms. She has tried nothing for the symptoms.  Knee Pain This is a chronic problem. The problem occurs daily. The problem has been gradually worsening. Pertinent negatives include no coughing, fever, joint swelling, nausea, numbness, vomiting or weakness.  Back Pain  This is a new problem. The current episode started 2 days ago. The quality of the pain is described as burning. The pain radiates to the right thigh. The pain is at a severity of 9/10. The pain is moderate. Pertinent negatives include no fever, no numbness and no weakness.   46 year old morbidly obese woman coming in today with back pain that radiates into her right buttocks, and bilateral knee pain that is chronic, and asthma out of her inhaler. [facility] She has no insurance. Past medical history diabetes, hypertension, asthma, arthritis, CAD, depression and anxiety. Uses a cane.  Past Medical History  Diagnosis Date  . Diabetes mellitus   . Hypertension   . Asthma   . Arthritis   . Coronary artery disease   . Hyperlipidemia   . Phlebitis   . Asthmatic bronchitis , chronic   . Shortness of breath 05/23/11    "exertion; laying down"  . Depression   . Anxiety     Past Surgical History  Procedure Date  . Hernia repair     . Umbilical hernia repair 05/2004  . Cholecystectomy   . Dilation and curettage of uterus     No family history on file.  History  Substance Use Topics  . Smoking status: Former Smoker -- 0.2 packs/day for 19 years    Types: Cigarettes  . Smokeless tobacco: Never Used  . Alcohol Use: Yes     "quit alcohol ~ 2005; only then drank occasionl wine cooler"    OB History    Grav Para Term Preterm Abortions TAB SAB Ect Mult Living                  Review of Systems  Constitutional: Negative.  Negative for fever.  HENT: Negative.   Eyes: Negative.   Respiratory: Negative.  Negative for cough.   Cardiovascular: Negative.   Gastrointestinal: Negative.  Negative for nausea and vomiting.  Musculoskeletal: Positive for back pain and gait problem. Negative for joint swelling.       Bilateral knee pain  Neurological: Negative.  Negative for weakness and numbness.  Psychiatric/Behavioral: Negative.   All other systems reviewed and are negative.    Allergies  Review of patient's allergies indicates no known allergies.  Home Medications   Current Outpatient Rx  Name Route Sig Dispense Refill  . ALBUTEROL SULFATE HFA 108 (90 BASE) MCG/ACT IN AERS Inhalation Inhale 2 puffs into the lungs every 4 (four) hours as needed. For shortness of breath     . ATORVASTATIN CALCIUM 10 MG PO TABS Oral Take  10 mg by mouth daily.    . BUPROPION HCL ER (XL) 150 MG PO TB24 Oral Take 150 mg by mouth daily.      Marland Kitchen FLUTICASONE-SALMETEROL 250-50 MCG/DOSE IN AEPB Inhalation Inhale 1 puff into the lungs 2 (two) times daily as needed. For shortness of breath     . GABAPENTIN 600 MG PO TABS Oral Take 600 mg by mouth 2 (two) times daily.      Marland Kitchen LORATADINE 10 MG PO TABS Oral Take 10 mg by mouth daily.      Marland Kitchen METFORMIN HCL 500 MG PO TABS Oral Take 500 mg by mouth 2 (two) times daily with a meal.      . METOPROLOL SUCCINATE ER 100 MG PO TB24 Oral Take 100 mg by mouth daily.      Marland Kitchen PANTOPRAZOLE SODIUM 40 MG PO  TBEC Oral Take 40 mg by mouth daily.      Marland Kitchen PRAVASTATIN SODIUM 40 MG PO TABS Oral Take 40 mg by mouth daily.      Marland Kitchen RISPERIDONE 2 MG PO TABS Oral Take 2 mg by mouth at bedtime.      . SERTRALINE HCL 100 MG PO TABS Oral Take 100 mg by mouth daily.      BP 143/91  Pulse 84  Temp 98.8 F (37.1 C) (Oral)  Resp 20  SpO2 97%  Physical Exam  Nursing note and vitals reviewed. Constitutional: She is oriented to person, place, and time. She appears well-developed and well-nourished.  HENT:  Head: Normocephalic and atraumatic.  Eyes: Conjunctivae and EOM are normal. Pupils are equal, round, and reactive to light.  Neck: Normal range of motion. Neck supple.  Cardiovascular: Normal rate.   Pulmonary/Chest: Effort normal.  Abdominal: Soft.  Musculoskeletal: Normal range of motion. She exhibits no edema and no tenderness.       Bilateral lower parspinal back pain with palpatation  Bilateral knee pain with ambulation  Neurological: She is alert and oriented to person, place, and time. She has normal reflexes.  Skin: Skin is warm and dry.  Psychiatric: She has a normal mood and affect.    ED Course  Procedures (including critical care time)  Labs Reviewed - No data to display No results found.   No diagnosis found.    MDM  Chronic knee pain and paraspinal pain. Out of meds.  [facility].  List of pcp provided to follow up with asap, Rx for meloxicam and percocet.  Inhaler provided because she is out of it.          Remi Haggard, NP 02/29/12 2018

## 2012-03-07 NOTE — ED Provider Notes (Signed)
Medical screening examination/treatment/procedure(s) were performed by non-physician practitioner and as supervising physician I was immediately available for consultation/collaboration.  Rishit Burkhalter T Maryln Eastham, MD 03/07/12 1501 

## 2012-08-28 ENCOUNTER — Emergency Department (INDEPENDENT_AMBULATORY_CARE_PROVIDER_SITE_OTHER)
Admission: EM | Admit: 2012-08-28 | Discharge: 2012-08-28 | Disposition: A | Discharge status: Home / Self Care | Payer: Self-pay | Source: Home / Self Care

## 2012-08-28 ENCOUNTER — Encounter (HOSPITAL_COMMUNITY): Payer: Self-pay | Admitting: Emergency Medicine

## 2012-08-28 DIAGNOSIS — E119 Type 2 diabetes mellitus without complications: Secondary | ICD-10-CM

## 2012-08-28 LAB — HEMOGLOBIN A1C
Hgb A1c MFr Bld: 6.1 % — ABNORMAL HIGH (ref <5.7)
Mean Plasma Glucose: 128 mg/dL — ABNORMAL HIGH (ref <117)

## 2012-08-28 MED ORDER — FLUTICASONE-SALMETEROL 250-50 MCG/DOSE IN AEPB: 1.0000 | INHALATION_SPRAY | Freq: Two times a day (BID) | RESPIRATORY_TRACT | Status: DC | PRN

## 2012-08-28 MED ORDER — MELOXICAM 7.5 MG PO TABS: 15.0000 mg | ORAL_TABLET | Freq: Every day | ORAL | Status: DC

## 2012-08-28 MED ORDER — FREESTYLE SYSTEM KIT: 1.0000 | PACK | Freq: Three times a day (TID) | Status: DC

## 2012-08-28 MED ORDER — ALBUTEROL SULFATE HFA 108 (90 BASE) MCG/ACT IN AERS: 2.0000 | INHALATION_SPRAY | RESPIRATORY_TRACT | Status: DC | PRN

## 2012-08-28 MED ORDER — PANTOPRAZOLE SODIUM 40 MG PO TBEC: 40.0000 mg | DELAYED_RELEASE_TABLET | Freq: Every day | ORAL | Status: DC

## 2012-08-28 MED ORDER — METFORMIN HCL 500 MG PO TABS: 500.0000 mg | ORAL_TABLET | Freq: Two times a day (BID) | ORAL | Status: DC

## 2012-08-28 MED ORDER — METOPROLOL SUCCINATE ER 100 MG PO TB24: 100.0000 mg | ORAL_TABLET | Freq: Every day | ORAL | Status: DC

## 2012-08-28 MED ORDER — LORATADINE 10 MG PO TABS: 10.0000 mg | ORAL_TABLET | Freq: Every day | ORAL | Status: DC

## 2012-08-28 MED ORDER — ATORVASTATIN CALCIUM 10 MG PO TABS: 10.0000 mg | ORAL_TABLET | Freq: Every day | ORAL | Status: DC

## 2012-08-28 MED ORDER — SERTRALINE HCL 100 MG PO TABS: 100.0000 mg | ORAL_TABLET | Freq: Every day | ORAL | Status: DC

## 2012-08-28 NOTE — ED Notes (Signed)
Patient Demographics  Beth [name], is a 47 y.o. female  YNW:[id number]  MVH:[id number]  DOB - [birth year]  Chief Complaint  Patient presents with  . Leg Pain        Subjective:   Beth [name] today has, No headache, No chest pain, No abdominal pain - No Nausea, No new weakness tingling or numbness, No Cough - SOB.chr bilateral Knee pain L>R, needs med refills  Objective:    Filed Vitals:   08/28/12 1746  BP: 140/90  Pulse: 94  Temp: 98.3 F (36.8 C)  TempSrc: Oral  Resp: 18  SpO2: 97%     Exam  Awake Alert, Oriented X 3, No new F.N deficits, Normal affect [place].AT,PERRAL Supple Neck,No JVD, No cervical lymphadenopathy appriciated.  Symmetrical Chest wall movement, Good air movement bilaterally, CTAB RRR,No Gallops,Rubs or new Murmurs, No Parasternal Heave +ve B.Sounds, Abd Soft, Non tender, No organomegaly appriciated, No rebound - guarding or rigidity. No Cyanosis, Clubbing or edema, No new Rash or bruise Both knees no effusion, no warmth-erythema, non tender, chronic DJD changes.    Data Review   CBC No results found for this basename: WBC, HGB, HCT, PLT, MCV, MCH, MCHC, RDW, NEUTRABS, LYMPHSABS, MONOABS, EOSABS, BASOSABS, BANDABS, BANDSABD,  in the last 168 hours  Chemistries   No results found for this basename: NA, K, CL, CO2, GLUCOSE, BUN, CREATININE, GFRCGP, CALCIUM, MG, AST, ALT, ALKPHOS, BILITOT,  in the last 168 hours ------------------------------------------------------------------------------------------------------------------ No results found for this basename: HGBA1C,  in the last 72 hours ------------------------------------------------------------------------------------------------------------------ No results found for this basename: CHOL, HDL, LDLCALC, TRIG, CHOLHDL, LDLDIRECT,  in the last 72 hours ------------------------------------------------------------------------------------------------------------------ No results found for this  basename: TSH, T4TOTAL, FREET3, T3FREE, THYROIDAB,  in the last 72 hours ------------------------------------------------------------------------------------------------------------------ No results found for this basename: VITAMINB12, FOLATE, FERRITIN, TIBC, IRON, RETICCTPCT,  in the last 72 hours  Coagulation profile  No results found for this basename: INR, PROTIME,  in the last 168 hours     Prior to Admission medications   Medication Sig Start Date End Date Taking? Authorizing Provider  ARIPiprazole (ABILIFY) 10 MG tablet Take 10 mg by mouth daily.   Yes Historical Provider, MD  albuterol (PROVENTIL HFA;VENTOLIN HFA) 108 (90 BASE) MCG/ACT inhaler Inhale 2 puffs into the lungs every 4 (four) hours as needed. For shortness of breath 08/28/12   Leroy Sea, MD  atorvastatin (LIPITOR) 10 MG tablet Take 1 tablet (10 mg total) by mouth daily. 08/28/12   Leroy Sea, MD  DULoxetine (CYMBALTA) 60 MG capsule Take 60 mg by mouth daily.    Historical Provider, MD  Fluticasone-Salmeterol (ADVAIR) 250-50 MCG/DOSE AEPB Inhale 1 puff into the lungs 2 (two) times daily as needed. For shortness of breath 08/28/12   Leroy Sea, MD  gabapentin (NEURONTIN) 600 MG tablet Take 600 mg by mouth 2 (two) times daily.      Historical Provider, MD  glucose monitoring kit (FREESTYLE) monitoring kit 1 each by Does not apply route 4 (four) times daily - after meals and at bedtime. 1 month Diabetic Testing Supplies for QAC-QHS accuchecks. 08/28/12   Leroy Sea, MD  loratadine (CLARITIN) 10 MG tablet Take 1 tablet (10 mg total) by mouth daily. 08/28/12   Leroy Sea, MD  meloxicam (MOBIC) 7.5 MG tablet Take 2 tablets (15 mg total) by mouth daily. 08/28/12 08/28/13  Leroy Sea, MD  metFORMIN (GLUCOPHAGE) 500 MG tablet Take 1 tablet (500 mg total) by mouth 2 (two) times daily with  a meal. 08/28/12   Leroy Sea, MD  metoprolol succinate (TOPROL-XL) 100 MG 24 hr tablet Take 1 tablet (100 mg total) by  mouth daily. 08/28/12   Leroy Sea, MD  pantoprazole (PROTONIX) 40 MG tablet Take 1 tablet (40 mg total) by mouth daily. 08/28/12   Leroy Sea, MD  sertraline (ZOLOFT) 100 MG tablet Take 1 tablet (100 mg total) by mouth daily. 08/28/12   Leroy Sea, MD     Assessment & Plan   Bilat Chr Knee Pain - likely OA, L>R, get X Ray L knee, pain control on home meds  2DM-2 - glucophage, check A1c, QAC-HS checks, rtesting supplies given, bring log book.  Asthma - stable, Alb INH given PRN      Follow-up Information   Follow up with this clinic. Schedule an appointment as soon as possible for a visit in 2 weeks.       Leroy Sea M.D on 08/28/2012 at 6:00 PM  Leroy Sea, MD 08/28/12 [phone]

## 2012-08-28 NOTE — ED Notes (Signed)
Pt is here for bilateral knee painleg pain x5 years and to est care [facility] but due to their closure she has not seen a PCP Was told at one point that she may have arthritis and was told that she needed to loose wt Pain increases w/long periods of standing or walking Also c/o a sore on left leg Was taking meloxicam for the discomfort  She is alert and oriented w/no signs of acute distress.

## 2012-09-01 ENCOUNTER — Ambulatory Visit (HOSPITAL_COMMUNITY)
Admission: RE | Admit: 2012-09-01 | Discharge: 2012-09-01 | Disposition: A | Discharge status: Home / Self Care | Payer: Self-pay | Source: Ambulatory Visit | Attending: Internal Medicine | Admitting: Internal Medicine

## 2012-09-01 DIAGNOSIS — IMO0002 Reserved for concepts with insufficient information to code with codable children: Secondary | ICD-10-CM | POA: Insufficient documentation

## 2012-09-01 DIAGNOSIS — M25569 Pain in unspecified knee: Secondary | ICD-10-CM | POA: Insufficient documentation

## 2012-09-01 DIAGNOSIS — G8929 Other chronic pain: Secondary | ICD-10-CM | POA: Insufficient documentation

## 2012-09-19 ENCOUNTER — Encounter (HOSPITAL_COMMUNITY): Payer: Self-pay | Admitting: *Deleted

## 2012-09-19 ENCOUNTER — Emergency Department (INDEPENDENT_AMBULATORY_CARE_PROVIDER_SITE_OTHER)
Admission: EM | Admit: 2012-09-19 | Discharge: 2012-09-19 | Disposition: A | Discharge status: Home / Self Care | Payer: Self-pay | Source: Home / Self Care | Attending: Family Medicine | Admitting: Family Medicine

## 2012-09-19 DIAGNOSIS — G8929 Other chronic pain: Secondary | ICD-10-CM | POA: Insufficient documentation

## 2012-09-19 DIAGNOSIS — G589 Mononeuropathy, unspecified: Secondary | ICD-10-CM

## 2012-09-19 DIAGNOSIS — M25569 Pain in unspecified knee: Secondary | ICD-10-CM | POA: Insufficient documentation

## 2012-09-19 DIAGNOSIS — M1712 Unilateral primary osteoarthritis, left knee: Secondary | ICD-10-CM | POA: Insufficient documentation

## 2012-09-19 DIAGNOSIS — F3289 Other specified depressive episodes: Secondary | ICD-10-CM

## 2012-09-19 DIAGNOSIS — F329 Major depressive disorder, single episode, unspecified: Secondary | ICD-10-CM

## 2012-09-19 DIAGNOSIS — J45909 Unspecified asthma, uncomplicated: Secondary | ICD-10-CM

## 2012-09-19 DIAGNOSIS — I10 Essential (primary) hypertension: Secondary | ICD-10-CM

## 2012-09-19 DIAGNOSIS — E119 Type 2 diabetes mellitus without complications: Secondary | ICD-10-CM

## 2012-09-19 DIAGNOSIS — E785 Hyperlipidemia, unspecified: Secondary | ICD-10-CM

## 2012-09-19 MED ORDER — METOPROLOL TARTRATE 100 MG PO TABS: 100.0000 mg | ORAL_TABLET | Freq: Two times a day (BID) | ORAL | Status: DC

## 2012-09-19 MED ORDER — HYDROCHLOROTHIAZIDE 12.5 MG PO TABS: 12.5000 mg | ORAL_TABLET | Freq: Every day | ORAL | Status: DC

## 2012-09-19 MED ORDER — METHYLPREDNISOLONE ACETATE 40 MG/ML IJ SUSP
INTRAMUSCULAR | Status: AC
  Filled 2012-09-19: qty 5

## 2012-09-19 MED ORDER — METHYLPREDNISOLONE ACETATE 40 MG/ML IJ SUSP
40.0000 mg | Freq: Once | INTRAMUSCULAR | Status: AC
  Administered 2012-09-19: 40 mg via INTRA_ARTICULAR

## 2012-09-19 NOTE — ED Provider Notes (Signed)
Beth [name] is a 47 y.o. female who was seen in the followup clinic today. She notes bilateral knee pain and I was asked to consult on the patient for her her knee pain. She's taking meloxicam and tramadol for chronic knee pain. She notes her left knee pain is much worse than her right. She notes pain is worse with activity and better with rest. She denies any significant radiating pain weakness or numbness. She currently does not have insurance and has not eligible for the affordable care act.  She has not worked in over 5 years because of her knee pain. She denies any recent injury.   PMH reviewed. Hypertension and obesity History  Substance Use Topics  . Smoking status: Former Smoker -- 0.25 packs/day for 19 years    Types: Cigarettes  . Smokeless tobacco: Never Used  . Alcohol Use: Yes     Comment: "quit alcohol ~ 2005; only then drank occasionl wine cooler"   ROS as above Medications reviewed. No current facility-administered medications for this encounter.   Current Outpatient Prescriptions  Medication Sig Dispense Refill  . albuterol (PROVENTIL HFA;VENTOLIN HFA) 108 (90 BASE) MCG/ACT inhaler Inhale 2 puffs into the lungs every 4 (four) hours as needed. For shortness of breath  1 Inhaler  2  . ARIPiprazole (ABILIFY) 10 MG tablet Take 10 mg by mouth daily.      Marland Kitchen atorvastatin (LIPITOR) 10 MG tablet Take 1 tablet (10 mg total) by mouth daily.  30 tablet  2  . DULoxetine (CYMBALTA) 60 MG capsule Take 60 mg by mouth daily.      . Fluticasone-Salmeterol (ADVAIR) 250-50 MCG/DOSE AEPB Inhale 1 puff into the lungs 2 (two) times daily as needed. For shortness of breath  60 each  2  . gabapentin (NEURONTIN) 600 MG tablet Take 600 mg by mouth 2 (two) times daily.        Marland Kitchen glucose monitoring kit (FREESTYLE) monitoring kit 1 each by Does not apply route 4 (four) times daily - after meals and at bedtime. 1 month Diabetic Testing Supplies for QAC-QHS accuchecks.  1 each  1  . hydrochlorothiazide  (HYDRODIURIL) 12.5 MG tablet Take 1 tablet (12.5 mg total) by mouth daily.  30 tablet  3  . loratadine (CLARITIN) 10 MG tablet Take 1 tablet (10 mg total) by mouth daily.  30 tablet  0  . meloxicam (MOBIC) 7.5 MG tablet Take 2 tablets (15 mg total) by mouth daily.  30 tablet  2  . metFORMIN (GLUCOPHAGE) 500 MG tablet Take 1 tablet (500 mg total) by mouth 2 (two) times daily with a meal.  30 tablet  2  . metoprolol (LOPRESSOR) 100 MG tablet Take 1 tablet (100 mg total) by mouth 2 (two) times daily.  60 tablet  2  . pantoprazole (PROTONIX) 40 MG tablet Take 1 tablet (40 mg total) by mouth daily.  30 tablet  2  . sertraline (ZOLOFT) 100 MG tablet Take 1 tablet (100 mg total) by mouth daily.  30 tablet  2  . [DISCONTINUED] buPROPion (WELLBUTRIN XL) 150 MG 24 hr tablet Take 150 mg by mouth daily.        . [DISCONTINUED] metoprolol succinate (TOPROL-XL) 100 MG 24 hr tablet Take 1 tablet (100 mg total) by mouth daily.  30 tablet  2  . [DISCONTINUED] pravastatin (PRAVACHOL) 40 MG tablet Take 40 mg by mouth daily.        . [DISCONTINUED] risperiDONE (RISPERDAL) 2 MG tablet Take 2 mg  by mouth at bedtime.          Exam:  BP 164/109  Pulse 94  Temp(Src) 98 F (36.7 C) (Oral)  SpO2 100% Gen: Well NAD Left knee: Normal-appearing no effusion mild synovitis Range of motion 0-120 Tender over the medial joint line Stable ligamentous exam Capillary refill and sensation are intact distally  Review of old left knee x-ray from December 2012 shows severe nearing bone-on-bone DJD.   Left Knee injection. Consent obtained and timeout performed. Patient seated in a comfortable position with legs hanging off the table.  The medial Peri-patellar tendon space was palpated and marked. The skin was then cleaned with Betadine. Cold spray applied. A 25-gauge inch and a half needle was used to inject 40 mg of Depo-Medrol and 4 mL of 2% lidocaine. Patient tolerated procedure well no bleeding. Pain improved following  injection   Assessment and Plan: 47 y.o. female with left knee severe DJD.  Corticosteroid injection today.  Recommend continuing tramadol and meloxicam as needed.  Hopefully she'll be able to apply for disability and obtain insurance for knee replacement. Ultimately knee replacement is the treatment.  Intermittent knee corticosteroid injections is reasonable for the time being.  Followup as needed.      Rodolph Bong, MD 09/19/12 [phone]

## 2012-09-19 NOTE — ED Provider Notes (Signed)
History   CSN: [id number]  Arrival date & time 09/19/12  1647   First MD Initiated Contact with Patient 09/19/12 1733     Chief Complaint  Patient presents with  . Follow-up   HPI Patient is presenting today in followup for her x-ray results.  Her blood pressure is elevated today.  She reports that she has not been able to take her blood pressure medications because she cannot afford it.  She reports that she does not have any income at this time.  She reports that the Toprol-XL is too expensive.  She reports that since she ran out of the Toprol-XL she's having some palpitations.  She denies having chest pain and shortness of breath.  She continues to have significant left knee pain.  She reports that she would like to see the results of the x-rays.  Past Medical History  Diagnosis Date  . Diabetes mellitus   . Hypertension   . Asthma   . Arthritis   . Coronary artery disease   . Hyperlipidemia   . Phlebitis   . Asthmatic bronchitis , chronic   . Shortness of breath 05/23/11    "exertion; laying down"  . Depression   . Anxiety     Past Surgical History  Procedure Laterality Date  . Hernia repair    . Umbilical hernia repair  05/2004  . Cholecystectomy    . Dilation and curettage of uterus      No family history on file.  History  Substance Use Topics  . Smoking status: Former Smoker -- 0.25 packs/day for 19 years    Types: Cigarettes  . Smokeless tobacco: Never Used  . Alcohol Use: Yes     Comment: "quit alcohol ~ 2005; only then drank occasionl wine cooler"    OB History   Grav Para Term Preterm Abortions TAB SAB Ect Mult Living                 Review of Systems  HENT: Positive for congestion.   Cardiovascular: Negative for chest pain.  Musculoskeletal: Positive for myalgias, back pain, joint swelling and arthralgias.  Neurological: Positive for headaches.  All other systems reviewed and are negative.    Allergies  Review of patient's allergies indicates  no known allergies.  Home Medications   Current Outpatient Rx  Name  Route  Sig  Dispense  Refill  . albuterol (PROVENTIL HFA;VENTOLIN HFA) 108 (90 BASE) MCG/ACT inhaler   Inhalation   Inhale 2 puffs into the lungs every 4 (four) hours as needed. For shortness of breath   1 Inhaler   2   . ARIPiprazole (ABILIFY) 10 MG tablet   Oral   Take 10 mg by mouth daily.         Marland Kitchen atorvastatin (LIPITOR) 10 MG tablet   Oral   Take 1 tablet (10 mg total) by mouth daily.   30 tablet   2   . DULoxetine (CYMBALTA) 60 MG capsule   Oral   Take 60 mg by mouth daily.         . Fluticasone-Salmeterol (ADVAIR) 250-50 MCG/DOSE AEPB   Inhalation   Inhale 1 puff into the lungs 2 (two) times daily as needed. For shortness of breath   60 each   2   . gabapentin (NEURONTIN) 600 MG tablet   Oral   Take 600 mg by mouth 2 (two) times daily.           Marland Kitchen glucose monitoring kit (FREESTYLE)  monitoring kit   Does not apply   1 each by Does not apply route 4 (four) times daily - after meals and at bedtime. 1 month Diabetic Testing Supplies for QAC-QHS accuchecks.   1 each   1   . loratadine (CLARITIN) 10 MG tablet   Oral   Take 1 tablet (10 mg total) by mouth daily.   30 tablet   0   . meloxicam (MOBIC) 7.5 MG tablet   Oral   Take 2 tablets (15 mg total) by mouth daily.   30 tablet   2   . metFORMIN (GLUCOPHAGE) 500 MG tablet   Oral   Take 1 tablet (500 mg total) by mouth 2 (two) times daily with a meal.   30 tablet   2   . metoprolol succinate (TOPROL-XL) 100 MG 24 hr tablet   Oral   Take 1 tablet (100 mg total) by mouth daily.   30 tablet   2   . pantoprazole (PROTONIX) 40 MG tablet   Oral   Take 1 tablet (40 mg total) by mouth daily.   30 tablet   2   . sertraline (ZOLOFT) 100 MG tablet   Oral   Take 1 tablet (100 mg total) by mouth daily.   30 tablet   2     Physical Exam  Nursing note and vitals reviewed. Constitutional: She is oriented to person, place, and  time. She appears well-developed and well-nourished.  HENT:  Head: Normocephalic and atraumatic.  Neck: Normal range of motion. Neck supple. No JVD present. No tracheal deviation present. No thyromegaly present.  Cardiovascular: Normal rate, regular rhythm and normal heart sounds.   Pulmonary/Chest: Effort normal and breath sounds normal.  Abdominal: Soft. Bowel sounds are normal.  Musculoskeletal: She exhibits tenderness.       Right knee: She exhibits swelling.       Left knee: She exhibits decreased range of motion, abnormal patellar mobility and bony tenderness. Tenderness found. Medial joint line and patellar tendon tenderness noted.  Lymphadenopathy:    She has no cervical adenopathy.  Neurological: She is alert and oriented to person, place, and time.  Skin: Skin is warm and dry.  Psychiatric: She has a normal mood and affect. Her behavior is normal. Judgment and thought content normal.    ED Course  Procedures (including critical care time)  Labs Reviewed - No data to display No results found.  No diagnosis found.  MDM  IMPRESSION  Hypertension  Type 2 diabetes mellitus, well controlled as evidenced by an A1c of 6.1%  Advanced degeneration of left knee  Chronic left knee pain  RECOMMENDATIONS / PLAN I reviewed the xray results with the patient.  Ortho was able to see patient in clinic today and provided patient with a steroid injection in left knee.   Pt tolerated it well and had immediate relief after the injection.   Reviewed A1c results as her diabetes is well controlled at 6.1%.  D/C toprol XL because of cost of medication Rx for metoprololol tartrate 100 mg po bid hctz 12.5 mg po daily Follow up closely  FOLLOW UP 1 month for complete labs:  CMP, CBC, lipid panel, Vit D level and blood pressure check   The patient was given clear instructions to go to ER or return to medical center if symptoms don't improve, worsen or new problems develop.  The patient  verbalized understanding.  The patient was told to call to get lab results if they  haven't heard anything in the next week.            Cleora Fleet, MD 09/19/12 [phone]

## 2012-09-19 NOTE — ED Notes (Signed)
Pt states she needs an X-R ay on her knees.  Bilateral knee pain.

## 2012-09-23 NOTE — ED Provider Notes (Signed)
Medical screening examination/treatment/procedure(s) were performed by resident physician or non-physician practitioner and as supervising physician I was immediately available for consultation/collaboration.   Barkley Bruns MD.   Linna Hoff, MD 09/23/12 2049

## 2012-10-20 ENCOUNTER — Emergency Department (INDEPENDENT_AMBULATORY_CARE_PROVIDER_SITE_OTHER): Payer: Self-pay

## 2012-10-20 ENCOUNTER — Emergency Department (INDEPENDENT_AMBULATORY_CARE_PROVIDER_SITE_OTHER)
Admission: EM | Admit: 2012-10-20 | Discharge: 2012-10-20 | Disposition: A | Discharge status: Home / Self Care | Payer: Self-pay | Source: Home / Self Care

## 2012-10-20 ENCOUNTER — Encounter (HOSPITAL_COMMUNITY): Payer: Self-pay

## 2012-10-20 DIAGNOSIS — I10 Essential (primary) hypertension: Secondary | ICD-10-CM

## 2012-10-20 DIAGNOSIS — E785 Hyperlipidemia, unspecified: Secondary | ICD-10-CM

## 2012-10-20 DIAGNOSIS — M175 Other unilateral secondary osteoarthritis of knee: Secondary | ICD-10-CM

## 2012-10-20 DIAGNOSIS — E119 Type 2 diabetes mellitus without complications: Secondary | ICD-10-CM

## 2012-10-20 DIAGNOSIS — M174 Other bilateral secondary osteoarthritis of knee: Secondary | ICD-10-CM

## 2012-10-20 MED ORDER — HYDROCHLOROTHIAZIDE 12.5 MG PO TABS: 25.0000 mg | ORAL_TABLET | Freq: Every day | ORAL | Status: DC

## 2012-10-20 NOTE — ED Notes (Signed)
Follow up-leg pain

## 2012-10-20 NOTE — ED Provider Notes (Addendum)
History     CSN: [id number]  Arrival date & time 10/20/12  1576  47 year old female presents to the adult care clinic for followup of her knee pain. The patient had a corticosteroid injection to her left knee during her last visit. She denies any joint swelling effusion redness except for pain with weightbearing pain. She is requesting right knee x-rays. Pressure mildly elevated during this visit     Chief Complaint  Patient presents with  . Follow-up    (Consider location/radiation/quality/duration/timing/severity/associated sxs/prior treatment) HPI  Past Medical History  Diagnosis Date  . Diabetes mellitus   . Hypertension   . Asthma   . Arthritis   . Coronary artery disease   . Hyperlipidemia   . Phlebitis   . Asthmatic bronchitis , chronic   . Shortness of breath 05/23/11    "exertion; laying down"  . Depression   . Anxiety     Past Surgical History  Procedure Laterality Date  . Hernia repair    . Umbilical hernia repair  05/2004  . Cholecystectomy    . Dilation and curettage of uterus      No family history on file.  History  Substance Use Topics  . Smoking status: Former Smoker -- 0.25 packs/day for 19 years    Types: Cigarettes  . Smokeless tobacco: Never Used  . Alcohol Use: Yes     Comment: "quit alcohol ~ 2005; only then drank occasionl wine cooler"    OB History   Grav Para Term Preterm Abortions TAB SAB Ect Mult Living                  Review of Systems Bilateral knee pain Allergies  Review of patient's allergies indicates no known allergies.  Home Medications   Current Outpatient Rx  Name  Route  Sig  Dispense  Refill  . albuterol (PROVENTIL HFA;VENTOLIN HFA) 108 (90 BASE) MCG/ACT inhaler   Inhalation   Inhale 2 puffs into the lungs every 4 (four) hours as needed. For shortness of breath   1 Inhaler   2   . ARIPiprazole (ABILIFY) 10 MG tablet   Oral   Take 10 mg by mouth daily.         Marland Kitchen atorvastatin (LIPITOR) 10 MG tablet   Oral   Take 1 tablet (10 mg total) by mouth daily.   30 tablet   2   . DULoxetine (CYMBALTA) 60 MG capsule   Oral   Take 60 mg by mouth daily.         . Fluticasone-Salmeterol (ADVAIR) 250-50 MCG/DOSE AEPB   Inhalation   Inhale 1 puff into the lungs 2 (two) times daily as needed. For shortness of breath   60 each   2   . gabapentin (NEURONTIN) 600 MG tablet   Oral   Take 600 mg by mouth 2 (two) times daily.           Marland Kitchen glucose monitoring kit (FREESTYLE) monitoring kit   Does not apply   1 each by Does not apply route 4 (four) times daily - after meals and at bedtime. 1 month Diabetic Testing Supplies for QAC-QHS accuchecks.   1 each   1   . hydrochlorothiazide (HYDRODIURIL) 12.5 MG tablet   Oral   Take 2 tablets (25 mg total) by mouth daily.   30 tablet   3   . loratadine (CLARITIN) 10 MG tablet   Oral   Take 1 tablet (10 mg total) by mouth daily.  30 tablet   0   . meloxicam (MOBIC) 7.5 MG tablet   Oral   Take 2 tablets (15 mg total) by mouth daily.   30 tablet   2   . metFORMIN (GLUCOPHAGE) 500 MG tablet   Oral   Take 1 tablet (500 mg total) by mouth 2 (two) times daily with a meal.   30 tablet   2   . metoprolol (LOPRESSOR) 100 MG tablet   Oral   Take 1 tablet (100 mg total) by mouth 2 (two) times daily.   60 tablet   2   . pantoprazole (PROTONIX) 40 MG tablet   Oral   Take 1 tablet (40 mg total) by mouth daily.   30 tablet   2   . sertraline (ZOLOFT) 100 MG tablet   Oral   Take 1 tablet (100 mg total) by mouth daily.   30 tablet   2     BP 142/105  Pulse 91  Temp(Src) 98.2 F (36.8 C) (Oral)  Resp 16  SpO2 96%  Physical Exam Neck: Normal range of motion. Neck supple. No JVD present. No tracheal deviation present. No thyromegaly present.  Cardiovascular: Normal rate, regular rhythm and normal heart sounds.  Pulmonary/Chest: Effort normal and breath sounds normal.  Abdominal: Soft. Bowel sounds are normal.  Musculoskeletal: She  exhibits tenderness.  Right knee: She exhibits swelling.  Left knee: She exhibits decreased range of motion, abnormal patellar mobility and bony tenderness. Tenderness found. Medial joint line and patellar tendon tenderness noted.  Lymphadenopathy:  She has no cervical adenopathy.  Neurological: She is alert and oriented to person, place, and time.  Skin: Skin is warm and dry.  Psychiatric: She has a normal mood and affect. Her behavior is normal. Judgment and thought content normal.   ED Course  Procedures (including critical care time)  Labs Reviewed - No data to display No results found.   No diagnosis found.    MDM  Bilateral knee pain secondary to osteoarthritis Continue meloxicam, X-ray of her right knee  Hypertension uncontrolled, hydrochlorothiazide increased to 25 mg a day   followup visit in 2 months       Richarda Overlie, MD 10/20/12 1528  Richarda Overlie, MD 10/21/12 1032

## 2012-11-13 ENCOUNTER — Emergency Department (HOSPITAL_COMMUNITY)
Admission: EM | Admit: 2012-11-13 | Discharge: 2012-11-13 | Disposition: A | Discharge status: Home / Self Care | Payer: Self-pay | Attending: Emergency Medicine | Admitting: Emergency Medicine

## 2012-11-13 ENCOUNTER — Encounter (HOSPITAL_COMMUNITY): Payer: Self-pay | Admitting: Emergency Medicine

## 2012-11-13 DIAGNOSIS — F329 Major depressive disorder, single episode, unspecified: Secondary | ICD-10-CM | POA: Insufficient documentation

## 2012-11-13 DIAGNOSIS — J45909 Unspecified asthma, uncomplicated: Secondary | ICD-10-CM | POA: Insufficient documentation

## 2012-11-13 DIAGNOSIS — N764 Abscess of vulva: Secondary | ICD-10-CM

## 2012-11-13 DIAGNOSIS — Z8672 Personal history of thrombophlebitis: Secondary | ICD-10-CM | POA: Insufficient documentation

## 2012-11-13 DIAGNOSIS — Z87891 Personal history of nicotine dependence: Secondary | ICD-10-CM | POA: Insufficient documentation

## 2012-11-13 DIAGNOSIS — E785 Hyperlipidemia, unspecified: Secondary | ICD-10-CM | POA: Insufficient documentation

## 2012-11-13 DIAGNOSIS — Z79899 Other long term (current) drug therapy: Secondary | ICD-10-CM | POA: Insufficient documentation

## 2012-11-13 DIAGNOSIS — F3289 Other specified depressive episodes: Secondary | ICD-10-CM | POA: Insufficient documentation

## 2012-11-13 DIAGNOSIS — I2510 Atherosclerotic heart disease of native coronary artery without angina pectoris: Secondary | ICD-10-CM | POA: Insufficient documentation

## 2012-11-13 DIAGNOSIS — Z23 Encounter for immunization: Secondary | ICD-10-CM | POA: Insufficient documentation

## 2012-11-13 DIAGNOSIS — N751 Abscess of Bartholin's gland: Secondary | ICD-10-CM | POA: Insufficient documentation

## 2012-11-13 DIAGNOSIS — E119 Type 2 diabetes mellitus without complications: Secondary | ICD-10-CM | POA: Insufficient documentation

## 2012-11-13 DIAGNOSIS — I10 Essential (primary) hypertension: Secondary | ICD-10-CM | POA: Insufficient documentation

## 2012-11-13 DIAGNOSIS — M129 Arthropathy, unspecified: Secondary | ICD-10-CM | POA: Insufficient documentation

## 2012-11-13 DIAGNOSIS — F411 Generalized anxiety disorder: Secondary | ICD-10-CM | POA: Insufficient documentation

## 2012-11-13 MED ORDER — CEPHALEXIN 500 MG PO CAPS: 500.0000 mg | ORAL_CAPSULE | Freq: Four times a day (QID) | ORAL | Status: DC

## 2012-11-13 MED ORDER — TETANUS-DIPHTH-ACELL PERTUSSIS 5-2.5-18.5 LF-MCG/0.5 IM SUSP
0.5000 mL | Freq: Once | INTRAMUSCULAR | Status: AC
  Administered 2012-11-13: 0.5 mL via INTRAMUSCULAR
  Filled 2012-11-13: qty 0.5

## 2012-11-13 MED ORDER — SULFAMETHOXAZOLE-TRIMETHOPRIM 800-160 MG PO TABS: 1.0000 | ORAL_TABLET | Freq: Two times a day (BID) | ORAL | Status: DC

## 2012-11-13 MED ORDER — CEPHALEXIN 250 MG PO CAPS
500.0000 mg | ORAL_CAPSULE | Freq: Once | ORAL | Status: AC
  Administered 2012-11-13: 500 mg via ORAL
  Filled 2012-11-13: qty 2

## 2012-11-13 MED ORDER — SULFAMETHOXAZOLE-TMP DS 800-160 MG PO TABS
1.0000 | ORAL_TABLET | Freq: Once | ORAL | Status: AC
  Administered 2012-11-13: 1 via ORAL
  Filled 2012-11-13: qty 1

## 2012-11-13 MED ORDER — OXYCODONE-ACETAMINOPHEN 5-325 MG PO TABS
2.0000 | ORAL_TABLET | ORAL | Status: AC
Start: 2012-11-13 — End: 2012-11-13
  Administered 2012-11-13: 2 via ORAL
  Filled 2012-11-13: qty 2

## 2012-11-13 MED ORDER — OXYCODONE-ACETAMINOPHEN 5-325 MG PO TABS: 1.0000 | ORAL_TABLET | Freq: Four times a day (QID) | ORAL | Status: DC | PRN

## 2012-11-13 NOTE — ED Provider Notes (Addendum)
History     CSN: [id number]  Arrival date & time 11/13/12  0046   First MD Initiated Contact with Patient 11/13/12 0303      Chief Complaint  Patient presents with  . Cyst    (Consider location/radiation/quality/duration/timing/severity/associated sxs/prior treatment) Patient is a 47 y.o. female presenting with abscess. The history is provided by the patient.  Abscess Location:  Ano-genital Ano-genital abscess location:  Vulva Abscess quality: painful   Red streaking: no   Progression:  Worsening Pain details:    Quality:  Throbbing   Severity:  Severe   Timing:  Constant   Progression:  Unchanged Chronicity:  New Context: diabetes   Relieved by:  Nothing Worsened by:  Nothing tried Ineffective treatments:  None tried Associated symptoms: no anorexia and no fever     Past Medical History  Diagnosis Date  . Diabetes mellitus   . Hypertension   . Asthma   . Arthritis   . Coronary artery disease   . Hyperlipidemia   . Phlebitis   . Asthmatic bronchitis , chronic   . Shortness of breath 05/23/11    "exertion; laying down"  . Depression   . Anxiety     Past Surgical History  Procedure Laterality Date  . Hernia repair    . Umbilical hernia repair  05/2004  . Cholecystectomy    . Dilation and curettage of uterus      No family history on file.  History  Substance Use Topics  . Smoking status: Former Smoker -- 0.25 packs/day for 19 years    Types: Cigarettes  . Smokeless tobacco: Never Used  . Alcohol Use: Yes     Comment: "quit alcohol ~ 2005; only then drank occasionl wine cooler"    OB History   Grav Para Term Preterm Abortions TAB SAB Ect Mult Living                  Review of Systems  Constitutional: Negative for fever.  Gastrointestinal: Negative for anorexia.  All other systems reviewed and are negative.    Allergies  Review of patient's allergies indicates no known allergies.  Home Medications   Current Outpatient Rx  Name   Route  Sig  Dispense  Refill  . albuterol (PROVENTIL HFA;VENTOLIN HFA) 108 (90 BASE) MCG/ACT inhaler   Inhalation   Inhale 2 puffs into the lungs every 4 (four) hours as needed. For shortness of breath   1 Inhaler   2   . ARIPiprazole (ABILIFY) 10 MG tablet   Oral   Take 10 mg by mouth daily.         Marland Kitchen atorvastatin (LIPITOR) 10 MG tablet   Oral   Take 1 tablet (10 mg total) by mouth daily.   30 tablet   2   . DULoxetine (CYMBALTA) 60 MG capsule   Oral   Take 60 mg by mouth daily.         . Fluticasone-Salmeterol (ADVAIR) 250-50 MCG/DOSE AEPB   Inhalation   Inhale 1 puff into the lungs 2 (two) times daily as needed. For shortness of breath   60 each   2   . gabapentin (NEURONTIN) 600 MG tablet   Oral   Take 600 mg by mouth 3 (three) times daily.          . hydrochlorothiazide (HYDRODIURIL) 12.5 MG tablet   Oral   Take 2 tablets (25 mg total) by mouth daily.   30 tablet   3   .  loratadine (CLARITIN) 10 MG tablet   Oral   Take 1 tablet (10 mg total) by mouth daily.   30 tablet   0   . meloxicam (MOBIC) 7.5 MG tablet   Oral   Take 2 tablets (15 mg total) by mouth daily.   30 tablet   2   . metFORMIN (GLUCOPHAGE) 500 MG tablet   Oral   Take 1 tablet (500 mg total) by mouth 2 (two) times daily with a meal.   30 tablet   2   . metoprolol (LOPRESSOR) 100 MG tablet   Oral   Take 200 mg by mouth daily.         . pantoprazole (PROTONIX) 40 MG tablet   Oral   Take 1 tablet (40 mg total) by mouth daily.   30 tablet   2   . sertraline (ZOLOFT) 100 MG tablet   Oral   Take 1 tablet (100 mg total) by mouth daily.   30 tablet   2   . glucose monitoring kit (FREESTYLE) monitoring kit   Does not apply   1 each by Does not apply route 4 (four) times daily - after meals and at bedtime. 1 month Diabetic Testing Supplies for QAC-QHS accuchecks.   1 each   1     BP 153/93  Pulse 107  Temp(Src) 98.4 F (36.9 C) (Oral)  Resp 18  SpO2 97%  Physical  Exam  Constitutional: She is oriented to person, place, and time. She appears well-developed and well-nourished. No distress.  HENT:  Head: Normocephalic and atraumatic.  Mouth/Throat: Oropharynx is clear and moist.  Eyes: Conjunctivae are normal. Pupils are equal, round, and reactive to light.  Neck: Normal range of motion. Neck supple.  Cardiovascular: Normal rate, regular rhythm and intact distal pulses.   Pulmonary/Chest: Effort normal and breath sounds normal. She has no wheezes. She has no rales.  Abdominal: Soft. Bowel sounds are normal. There is no tenderness. There is no rebound and no guarding.  Genitourinary:     Bartholin abscess right   Musculoskeletal: Normal range of motion.  Neurological: She is alert and oriented to person, place, and time.  Skin: Skin is warm and dry.  Psychiatric: She has a normal mood and affect.    ED Course  Procedures (including critical care time)  Labs Reviewed - No data to display No results found.   No diagnosis found.    MDM  INCISION AND DRAINAGE Performed by: Jasmine Awe Consent: Verbal consent obtained. Risks and benefits: risks, benefits and alternatives were discussed Type: abscess  Body area: right vulva  Anesthesia: local infiltration  Incision was made with a scalpel.  Local anesthetic: lidocaine 1%  Anesthetic total: 4  ml  Complexity: complex Blunt dissection to break up loculations Irrigated with large volume of sterile saline  Drainage: purulent  Drainage amount: copious  Patient tolerance: Patient tolerated the procedure well with no immediate complications.  Will place on antibiotics as patient is diabetic.  Follow up for recheck in 2 days sooner for fever swelling increased drainage n/v or any concerns.  Patient verbalizes understanding and agrees to follow up         Elster Corbello K Clee Pandit-Rasch, MD 11/13/12 1610  Jasmine Awe, MD 11/13/12 [phone]

## 2012-11-13 NOTE — ED Notes (Addendum)
PT. ARRIVED WITH PTAR FROM HOME REPORTS " CYST" AT RIGHT GROIN FOR 2 DAYS WITH NO DRAINAGE. HISTORY OF DM CBG= 139 BY EMS PTA.

## 2012-11-13 NOTE — ED Notes (Signed)
md and this nurse back at bedside for ward cath insertion.  Site was irrigated by ward cath unable to stay in. Pt tolerated well.

## 2012-11-13 NOTE — ED Notes (Signed)
md at bedside for i/d.  Pt tolerated well.  Pain med given to pt.

## 2012-11-13 NOTE — ED Notes (Signed)
Pt dc to home. Pt sts understanding to dc instructions. Pt ambulatory to exit without difficulty.  Pt denies need for w/c.  

## 2012-12-05 ENCOUNTER — Telehealth: Payer: Self-pay | Admitting: Internal Medicine

## 2012-12-05 ENCOUNTER — Ambulatory Visit: Payer: No Typology Code available for payment source | Attending: Family Medicine | Admitting: Family Medicine

## 2012-12-05 VITALS — HR 105 | Temp 99.0°F | Resp 18 | Ht 67.0 in | Wt 333.0 lb

## 2012-12-05 DIAGNOSIS — Z7251 High risk heterosexual behavior: Secondary | ICD-10-CM

## 2012-12-05 DIAGNOSIS — E669 Obesity, unspecified: Secondary | ICD-10-CM

## 2012-12-05 DIAGNOSIS — Z124 Encounter for screening for malignant neoplasm of cervix: Secondary | ICD-10-CM

## 2012-12-05 DIAGNOSIS — E785 Hyperlipidemia, unspecified: Secondary | ICD-10-CM

## 2012-12-05 DIAGNOSIS — B373 Candidiasis of vulva and vagina: Secondary | ICD-10-CM | POA: Insufficient documentation

## 2012-12-05 DIAGNOSIS — Z0000 Encounter for general adult medical examination without abnormal findings: Secondary | ICD-10-CM

## 2012-12-05 DIAGNOSIS — E119 Type 2 diabetes mellitus without complications: Secondary | ICD-10-CM

## 2012-12-05 DIAGNOSIS — I10 Essential (primary) hypertension: Secondary | ICD-10-CM

## 2012-12-05 DIAGNOSIS — B3731 Acute candidiasis of vulva and vagina: Secondary | ICD-10-CM | POA: Insufficient documentation

## 2012-12-05 DIAGNOSIS — Z1239 Encounter for other screening for malignant neoplasm of breast: Secondary | ICD-10-CM

## 2012-12-05 LAB — BASIC METABOLIC PANEL
Glucose, Bld: 84 mg/dL (ref 70–99)
Potassium: 3.8 mEq/L (ref 3.5–5.3)
Sodium: 139 mEq/L (ref 135–145)

## 2012-12-05 LAB — CBC WITH DIFFERENTIAL/PLATELET
Basophils Absolute: 0 10*3/uL (ref 0.0–0.1)
Basophils Relative: 0 % (ref 0–1)
Eosinophils Absolute: 0.2 10*3/uL (ref 0.0–0.7)
MCH: 27.3 pg (ref 26.0–34.0)
MCHC: 33.6 g/dL (ref 30.0–36.0)
Neutro Abs: 7.7 10*3/uL (ref 1.7–7.7)
Neutrophils Relative %: 70 % (ref 43–77)
Platelets: 349 10*3/uL (ref 150–400)
RDW: 15.9 % — ABNORMAL HIGH (ref 11.5–15.5)

## 2012-12-05 LAB — HEPATIC FUNCTION PANEL
ALT: 29 U/L (ref 0–35)
Albumin: 4.1 g/dL (ref 3.5–5.2)
Bilirubin, Direct: 0.1 mg/dL (ref 0.0–0.3)
Total Protein: 7.9 g/dL (ref 6.0–8.3)

## 2012-12-05 LAB — LDL CHOLESTEROL, DIRECT: Direct LDL: 134 mg/dL — ABNORMAL HIGH

## 2012-12-05 MED ORDER — FLUCONAZOLE 150 MG PO TABS: 150.0000 mg | ORAL_TABLET | Freq: Once | ORAL | Status: DC

## 2012-12-05 NOTE — Telephone Encounter (Signed)
Pt just here forgot her script for yeast infection. Since already home, would like script sent to [facility] pharmacy on [address] ( near [facility] Asks to please use med from $4 list.

## 2012-12-05 NOTE — Telephone Encounter (Signed)
Sent in

## 2012-12-05 NOTE — Progress Notes (Signed)
Subjective:     Patient ID: Beth [name], female   DOB: 08-21-1965, 47 y.o.   MRN: [id number]  HPI Established pt here for health maintanance exam. PMH as below. She follows here for her chronic medical conditions.   Past Medical History  Diagnosis Date  . Diabetes mellitus   . Hypertension   . Asthma   . Arthritis   . Coronary artery disease   . Hyperlipidemia   . Phlebitis   . Asthmatic bronchitis , chronic   . Shortness of breath 05/23/11    "exertion; laying down"  . Depression   . Anxiety      Health maintinance - pap - last pap in 2007, no abnormals. Has had 1 sexual partner in past few years but that partner has had multiple so she owuld like to get checked for STDs. C/o vaginal itching with d/c - mammo - 3 years ago, no history of abnormals, no FH breast cancer - tdap - 11/13/12 - flu - will need this fall - pneumovax - may benefit given former smoker and has asthma, has not had - zostavax - not indicated - c-scope - no FH colon cancer, will be due at age 13 - dexa - no steroids in the past and no fractures - BP - manged by pcp - cholesterol screen - managed by pcp - Vitamin D - has h/o insufficiency, herlas not been checked in several years - tobacco - quit in 2007 after smoking 1PPD for 10 years - alcohol - does not drink - drugs - used cocaine until 2006, denies other drugs - weight - obese    Review of Systems per hpi     Objective:   Physical Exam  Nursing note and vitals reviewed. Constitutional: She appears well-developed and well-nourished.  Cardiovascular: Normal rate and regular rhythm.   Pulmonary/Chest: Effort normal and breath sounds normal.  Genitourinary: Vaginal discharge found.  Pelvic - no ttp       Assessment:     HYPERLIPIDEMIA - Plan: LDL Cholesterol, Direct, Hemoglobin A1c  OBESITY, NOS - Plan: Hemoglobin A1c  HYPERTENSION, BENIGN SYSTEMIC - Plan: TSH, Microalbumin/Creatinine Ratio, Urine, CBC with Differential, Hemoglobin  A1c  DM type 2 (diabetes mellitus, type 2) - Plan: Hepatic Function Panel, Basic Metabolic Panel, Hemoglobin A1c, CANCELED: HgB A1c  Routine health maintenance - Plan: Vitamin D, 25-hydroxy  Screening for cervical cancer - Plan: Cytology - PAP Beth [name]  Screening for breast cancer - Plan: MM Digital Screening  Problems related to high-risk sexual behavior - Plan: Cervicovaginal ancillary only, RPR, HIV Antibody, Hepatitis panel, acute  Vaginal candida - Plan: fluconazole (DIFLUCAN) 150 MG tablet       Plan:     Health maint:  Health maintinance - pap - done today with HPV, if normal will not need until 2019 - mammo - ordered today, repeat yearly unless abnormal - tdap - 11/13/12, due 10/2022 - flu - will need this fall - pneumovax - will hold off for now. Can discuss before flu season. definitly give if starts smoking.  - zostavax - not indicated until age 55 - c-scope - no FH colon cancer, will be due at age 13 - dexa - no steroids in the past and no fractures, due at age 8 - BP - manged by pcp - cholesterol screen - managed by pcp - Vitamin D - checking today - tobacco - quit in 2007 after smoking 1PPD for 10 years, advised do not start again - alcohol -  does not drink - drugs - used cocaine until 2006, denies other drugs - weight - obese - advised to exerisse even a small amount every day. Goal: 10 minute walk 5x/week  Vgainal candidiasis - treat with diflucan x 1  Concern regarding possible STD exposure - STD labs checked today, willl f/u result. Advised have prtner get checked as well.    F/u 2 weeks for routine OV to discuss chronic probs

## 2012-12-05 NOTE — Patient Instructions (Addendum)

## 2012-12-06 LAB — TSH: TSH: 4.121 u[IU]/mL (ref 0.350–4.500)

## 2012-12-06 LAB — MICROALBUMIN / CREATININE URINE RATIO
Creatinine, Urine: 352.9 mg/dL
Microalb Creat Ratio: 10.5 mg/g (ref 0.0–30.0)
Microalb, Ur: 3.7 mg/dL — ABNORMAL HIGH (ref 0.00–1.89)

## 2012-12-06 LAB — HIV ANTIBODY (ROUTINE TESTING W REFLEX): HIV: NONREACTIVE

## 2012-12-06 LAB — RPR

## 2012-12-10 ENCOUNTER — Other Ambulatory Visit: Payer: Self-pay | Admitting: Family Medicine

## 2012-12-10 DIAGNOSIS — B9689 Other specified bacterial agents as the cause of diseases classified elsewhere: Secondary | ICD-10-CM

## 2012-12-10 MED ORDER — METRONIDAZOLE 500 MG PO TABS: 500.0000 mg | ORAL_TABLET | Freq: Two times a day (BID) | ORAL | Status: DC

## 2012-12-11 ENCOUNTER — Telehealth: Payer: Self-pay

## 2012-12-11 NOTE — Telephone Encounter (Signed)
Lab results and pap  Results given

## 2012-12-15 ENCOUNTER — Telehealth: Payer: Self-pay | Admitting: *Deleted

## 2012-12-15 NOTE — Telephone Encounter (Signed)
12/15/12 Patient made aware vitaminD level is low and cholesterol level is elevated, Protein in urine level is high and A1c slightly elevated  Per Dr.Wood also aware that \\she  has a bacterial  Vaginal infection. Prescription called [facility] [address] for [place] per Dr. Lucretia Roers. P.Wiliiams,RN BSN [facility]

## 2012-12-19 ENCOUNTER — Ambulatory Visit: Payer: No Typology Code available for payment source | Attending: Family Medicine | Admitting: Family Medicine

## 2012-12-19 ENCOUNTER — Encounter: Payer: Self-pay | Admitting: Family Medicine

## 2012-12-19 VITALS — BP 134/88 | HR 78 | Ht 68.0 in | Wt 328.4 lb

## 2012-12-19 DIAGNOSIS — F329 Major depressive disorder, single episode, unspecified: Secondary | ICD-10-CM

## 2012-12-19 DIAGNOSIS — B373 Candidiasis of vulva and vagina: Secondary | ICD-10-CM

## 2012-12-19 DIAGNOSIS — I10 Essential (primary) hypertension: Secondary | ICD-10-CM

## 2012-12-19 DIAGNOSIS — E785 Hyperlipidemia, unspecified: Secondary | ICD-10-CM

## 2012-12-19 DIAGNOSIS — M1712 Unilateral primary osteoarthritis, left knee: Secondary | ICD-10-CM

## 2012-12-19 DIAGNOSIS — G589 Mononeuropathy, unspecified: Secondary | ICD-10-CM

## 2012-12-19 DIAGNOSIS — M1710 Unilateral primary osteoarthritis, unspecified knee: Secondary | ICD-10-CM

## 2012-12-19 DIAGNOSIS — M25569 Pain in unspecified knee: Secondary | ICD-10-CM

## 2012-12-19 DIAGNOSIS — E119 Type 2 diabetes mellitus without complications: Secondary | ICD-10-CM

## 2012-12-19 DIAGNOSIS — E669 Obesity, unspecified: Secondary | ICD-10-CM

## 2012-12-19 DIAGNOSIS — G629 Polyneuropathy, unspecified: Secondary | ICD-10-CM

## 2012-12-19 MED ORDER — FLUCONAZOLE 150 MG PO TABS: 150.0000 mg | ORAL_TABLET | Freq: Once | ORAL | Status: DC

## 2012-12-19 MED ORDER — ERGOCALCIFEROL 1.25 MG (50000 UT) PO CAPS: 50000.0000 [IU] | ORAL_CAPSULE | ORAL | Status: DC

## 2012-12-19 NOTE — Patient Instructions (Addendum)
Knee Pain  The knee is the complex joint between your thigh and your lower leg. It is made up of bones, tendons, ligaments, and cartilage. The bones that make up the knee are:   The femur in the thigh.   The tibia and fibula in the lower leg.   The patella or kneecap riding in the groove on the lower femur.  CAUSES   Knee pain is a common complaint with many causes. A few of these causes are:   Injury, such as:   A ruptured ligament or tendon injury.   Torn cartilage.   Medical conditions, such as:   Gout   Arthritis   Infections   Overuse, over training or overdoing a physical activity.  Knee pain can be minor or severe. Knee pain can accompany debilitating injury. Minor knee problems often respond well to self-care measures or get well on their own. More serious injuries may need medical intervention or even surgery.  SYMPTOMS  The knee is complex. Symptoms of knee problems can vary widely. Some of the problems are:   Pain with movement and weight bearing.   Swelling and tenderness.   Buckling of the knee.   Inability to straighten or extend your knee.   Your knee locks and you cannot straighten it.   Warmth and redness with pain and fever.   Deformity or dislocation of the kneecap.  DIAGNOSIS   Determining what is wrong may be very straight forward such as when there is an injury. It can also be challenging because of the complexity of the knee. Tests to make a diagnosis may include:   Your caregiver taking a history and doing a physical exam.   Routine X-rays can be used to rule out other problems. X-rays will not reveal a cartilage tear. Some injuries of the knee can be diagnosed by:   Arthroscopy a surgical technique by which a small video camera is inserted through tiny incisions on the sides of the knee. This procedure is used to examine and repair internal knee joint problems. Tiny instruments can be used during arthroscopy to repair the torn knee cartilage (meniscus).   Arthrography  is a radiology technique. A contrast liquid is directly injected into the knee joint. Internal structures of the knee joint then become visible on X-ray film.   An MRI scan is a non x-ray radiology procedure in which magnetic fields and a computer produce two- or three-dimensional images of the inside of the knee. Cartilage tears are often visible using an MRI scanner. MRI scans have largely replaced arthrography in diagnosing cartilage tears of the knee.   Blood work.   Examination of the fluid that helps to lubricate the knee joint (synovial fluid). This is done by taking a sample out using a needle and a syringe.  TREATMENT  The treatment of knee problems depends on the cause. Some of these treatments are:   Depending on the injury, proper casting, splinting, surgery or physical therapy care will be needed.   Give yourself adequate recovery time. Do not overuse your joints. If you begin to get sore during workout routines, back off. Slow down or do fewer repetitions.   For repetitive activities such as cycling or running, maintain your strength and nutrition.   Alternate muscle groups. For example if you are a weight lifter, work the upper body on one day and the lower body the next.   Either tight or weak muscles do not give the proper support for your   knee. Tight or weak muscles do not absorb the stress placed on the knee joint. Keep the muscles surrounding the knee strong.   Take care of mechanical problems.   If you have flat feet, orthotics or special shoes may help. See your caregiver if you need help.   Arch supports, sometimes with wedges on the inner or outer aspect of the heel, can help. These can shift pressure away from the side of the knee most bothered by osteoarthritis.   A brace called an "unloader" brace also may be used to help ease the pressure on the most arthritic side of the knee.   If your caregiver has prescribed crutches, braces, wraps or ice, use as directed. The acronym for  this is PRICE. This means protection, rest, ice, compression and elevation.   Nonsteroidal anti-inflammatory drugs (NSAID's), can help relieve pain. But if taken immediately after an injury, they may actually increase swelling. Take NSAID's with food in your stomach. Stop them if you develop stomach problems. Do not take these if you have a history of ulcers, stomach pain or bleeding from the bowel. Do not take without your caregiver's approval if you have problems with fluid retention, heart failure, or kidney problems.   For ongoing knee problems, physical therapy may be helpful.   Glucosamine and chondroitin are over-the-counter dietary supplements. Both may help relieve the pain of osteoarthritis in the knee. These medicines are different from the usual anti-inflammatory drugs. Glucosamine may decrease the rate of cartilage destruction.   Injections of a corticosteroid drug into your knee joint may help reduce the symptoms of an arthritis flare-up. They may provide pain relief that lasts a few months. You may have to wait a few months between injections. The injections do have a small increased risk of infection, water retention and elevated blood sugar levels.   Hyaluronic acid injected into damaged joints may ease pain and provide lubrication. These injections may work by reducing inflammation. A series of shots may give relief for as long as 6 months.   Topical painkillers. Applying certain ointments to your skin may help relieve the pain and stiffness of osteoarthritis. Ask your pharmacist for suggestions. Many over the-counter products are approved for temporary relief of arthritis pain.   In some countries, doctors often prescribe topical NSAID's for relief of chronic conditions such as arthritis and tendinitis. A review of treatment with NSAID creams found that they worked as well as oral medications but without the serious side effects.  PREVENTION   Maintain a healthy weight. Extra pounds put  more strain on your joints.   Get strong, stay limber. Weak muscles are a common cause of knee injuries. Stretching is important. Include flexibility exercises in your workouts.   Be smart about exercise. If you have osteoarthritis, chronic knee pain or recurring injuries, you may need to change the way you exercise. This does not mean you have to stop being active. If your knees ache after jogging or playing basketball, consider switching to swimming, water aerobics or other low-impact activities, at least for a few days a week. Sometimes limiting high-impact activities will provide relief.   Make sure your shoes fit well. Choose footwear that is right for your sport.   Protect your knees. Use the proper gear for knee-sensitive activities. Use kneepads when playing volleyball or laying carpet. Buckle your seat belt every time you drive. Most shattered kneecaps occur in car accidents.   Rest when you are tired.  SEEK MEDICAL CARE IF:     You have knee pain that is continual and does not seem to be getting better.   SEEK IMMEDIATE MEDICAL CARE IF:   Your knee joint feels hot to the touch and you have a high fever.  MAKE SURE YOU:    Understand these instructions.   Will watch your condition.   Will get help right away if you are not doing well or get worse.  Document Released: 04/08/2007 Document Revised: 09/03/2011 Document Reviewed: 04/08/2007  [employer] Patient Information 2014 [employer]

## 2012-12-19 NOTE — Progress Notes (Signed)
Patient ID: Beth [name], female   DOB: [birth year], 47 y.o.   MRN: [id number]  CC: lab results   HPI: Patient is presenting today to get the results of labs that were drawn approximately 2 weeks ago.  She reports that she's doing well except that she's having chronic pain in the knees.  She reports that the injection that she received a few months ago did give her significant improvement in her knee pain.  She also wanted to review the results of the x-ray that she had done of her knee.  Her right knee causes pain when she is standing or walking for prolonged periods of time.  She otherwise reports that she's doing well.     No Known Allergies Past Medical History  Diagnosis Date  . Diabetes mellitus   . Hypertension   . Asthma   . Arthritis   . Coronary artery disease   . Hyperlipidemia   . Phlebitis   . Asthmatic bronchitis , chronic   . Shortness of breath 05/23/11    "exertion; laying down"  . Depression   . Anxiety    Current Outpatient Prescriptions on File Prior to Visit  Medication Sig Dispense Refill  . albuterol (PROVENTIL HFA;VENTOLIN HFA) 108 (90 BASE) MCG/ACT inhaler Inhale 2 puffs into the lungs every 4 (four) hours as needed. For shortness of breath  1 Inhaler  2  . ARIPiprazole (ABILIFY) 10 MG tablet Take 10 mg by mouth daily.      Marland Kitchen atorvastatin (LIPITOR) 10 MG tablet Take 1 tablet (10 mg total) by mouth daily.  30 tablet  2  . DULoxetine (CYMBALTA) 60 MG capsule Take 60 mg by mouth daily.      . Fluticasone-Salmeterol (ADVAIR) 250-50 MCG/DOSE AEPB Inhale 1 puff into the lungs 2 (two) times daily as needed. For shortness of breath  60 each  2  . gabapentin (NEURONTIN) 600 MG tablet Take 600 mg by mouth 3 (three) times daily.       Marland Kitchen glucose monitoring kit (FREESTYLE) monitoring kit 1 each by Does not apply route 4 (four) times daily - after meals and at bedtime. 1 month Diabetic Testing Supplies for QAC-QHS accuchecks.  1 each  1  . hydrochlorothiazide (HYDRODIURIL)  12.5 MG tablet Take 2 tablets (25 mg total) by mouth daily.  30 tablet  3  . loratadine (CLARITIN) 10 MG tablet Take 1 tablet (10 mg total) by mouth daily.  30 tablet  0  . meloxicam (MOBIC) 7.5 MG tablet Take 2 tablets (15 mg total) by mouth daily.  30 tablet  2  . metFORMIN (GLUCOPHAGE) 500 MG tablet Take 1 tablet (500 mg total) by mouth 2 (two) times daily with a meal.  30 tablet  2  . metoprolol (LOPRESSOR) 100 MG tablet Take 200 mg by mouth daily.      . metroNIDAZOLE (FLAGYL) 500 MG tablet Take 1 tablet (500 mg total) by mouth 2 (two) times daily.  14 tablet  0  . pantoprazole (PROTONIX) 40 MG tablet Take 1 tablet (40 mg total) by mouth daily.  30 tablet  2  . sertraline (ZOLOFT) 100 MG tablet Take 1 tablet (100 mg total) by mouth daily.  30 tablet  2  . [DISCONTINUED] buPROPion (WELLBUTRIN XL) 150 MG 24 hr tablet Take 150 mg by mouth daily.        . [DISCONTINUED] metoprolol succinate (TOPROL-XL) 100 MG 24 hr tablet Take 1 tablet (100 mg total) by mouth daily.  30  tablet  2  . [DISCONTINUED] pravastatin (PRAVACHOL) 40 MG tablet Take 40 mg by mouth daily.        . [DISCONTINUED] risperiDONE (RISPERDAL) 2 MG tablet Take 2 mg by mouth at bedtime.         No current facility-administered medications on file prior to visit.   No family history on file. History   Social History  . Marital Status: Legally Separated    Spouse Name: N/A    Number of Children: N/A  . Years of Education: N/A   Occupational History  . Not on file.   Social History Main Topics  . Smoking status: Former Smoker -- 0.25 packs/day for 19 years    Types: Cigarettes  . Smokeless tobacco: Never Used  . Alcohol Use: Yes     Comment: "quit alcohol ~ 2005; only then drank occasionl wine cooler"  . Drug Use: Yes    Special: Cocaine, Marijuana, "Crack" cocaine  . Sexually Active: No     Comment: "quit smoking cigarettes ~2005"   Other Topics Concern  . Not on file   Social History Narrative  . No narrative on  file    Review of Systems  Constitutional: Negative for fever, chills, diaphoresis, activity change, appetite change and fatigue.  HENT: Negative for ear pain, nosebleeds, congestion, facial swelling, rhinorrhea, neck pain, neck stiffness and ear discharge.   Eyes: Negative for pain, discharge, redness, itching and visual disturbance.  Respiratory: Negative for cough, choking, chest tightness, shortness of breath, wheezing and stridor.   Cardiovascular: Negative for chest pain, palpitations and leg swelling.  Gastrointestinal: Negative for abdominal distention.  Genitourinary: Negative for dysuria, urgency, frequency, hematuria, flank pain, decreased urine volume, difficulty urinating and dyspareunia.  Musculoskeletal: bilateral knee pain.  Neurological: Negative for dizziness, tremors, seizures, syncope, facial asymmetry, speech difficulty, weakness, light-headedness, numbness and headaches.  Hematological: Negative for adenopathy. Does not bruise/bleed easily.  Psychiatric/Behavioral: Negative for hallucinations, behavioral problems, confusion, dysphoric mood, decreased concentration and agitation.    Objective:   Filed Vitals:   12/19/12 1058  BP: 134/88  Pulse: 78    Physical Exam  Constitutional: Appears well-developed and well-nourished. No distress.  HENT: Normocephalic. External right and left ear normal. Oropharynx is clear and moist.  Eyes: Conjunctivae and EOM are normal. PERRLA, no scleral icterus.  Neck: Normal ROM. Neck supple. No JVD. No tracheal deviation. No thyromegaly.  CVS: RRR, S1/S2 +, no murmurs, no gallops, no carotid bruit.  Pulmonary: Effort and breath sounds normal, no stridor, rhonchi, wheezes, rales.  Abdominal: Soft. BS +,  no distension, tenderness, rebound or guarding.  Musculoskeletal: crepitus in both knees Lymphadenopathy: No lymphadenopathy noted, cervical, inguinal. Neuro: Alert. Normal reflexes, muscle tone coordination. No cranial nerve  deficit. Skin: Skin is warm and dry. No rash noted. Not diaphoretic. No erythema. No pallor.  Psychiatric: Normal mood and affect. Behavior, judgment, thought content normal.   Lab Results  Component Value Date   WBC 11.0* 12/05/2012   HGB 12.7 12/05/2012   HCT 37.8 12/05/2012   MCV 81.1 12/05/2012   PLT 349 12/05/2012   Lab Results  Component Value Date   CREATININE 0.78 12/05/2012   BUN 8 12/05/2012   NA 139 12/05/2012   K 3.8 12/05/2012   CL 104 12/05/2012   CO2 28 12/05/2012    Lab Results  Component Value Date   HGBA1C 5.9* 12/05/2012   Lipid Panel     Component Value Date/Time   CHOL 166 02/21/2009 2042  TRIG 134 02/21/2009 2042   HDL 34* 02/21/2009 2042   CHOLHDL 4.9 Ratio 02/21/2009 2042   VLDL 27 02/21/2009 2042   LDLCALC 105* 02/21/2009 2042      Assessment and plan:   Patient Active Problem List   Diagnosis Date Noted  . Asthma 09/19/2012  . Uncontrolled hypertension 09/19/2012  . Degenerative joint disease of knee, left 09/19/2012  . Chronic knee pain 09/19/2012  . DM type 2 (diabetes mellitus, type 2) 05/25/2011  . Asthma with exacerbation 05/23/2011  . Depression 05/23/2011  . Neuropathy 05/23/2011  . HYPERLIPIDEMIA 08/22/2006  . OBESITY, NOS 08/22/2006  . HYPERTENSION, BENIGN SYSTEMIC 08/22/2006  . MENSTRUAL CYCLE, IRREGULAR 08/22/2006   Reviewed lab results with patient today.    Continue current meds.   Pt still has significant osteoarthritis in both knees, requesting to follow up with sports medicine.  Referral requested to follow up with sports medicine clinic.    BP controlled on current meds.   RTC in 4 months  The patient was given clear instructions to go to ER or return to medical center if symptoms don't improve, worsen or new problems develop.  The patient verbalized understanding.  The patient was told to call to get any lab results if not heard anything in the next week.    Rodney Langton, MD, CDE, FAAFP [facility] [place], [place]

## 2012-12-19 NOTE — Progress Notes (Signed)
Follow up appointment.    Patient would like test results from lab and pap smear.   Patient states she is taking flagyl and might need refill on diflucan

## 2012-12-24 ENCOUNTER — Ambulatory Visit (INDEPENDENT_AMBULATORY_CARE_PROVIDER_SITE_OTHER): Payer: No Typology Code available for payment source | Admitting: Sports Medicine

## 2012-12-24 VITALS — BP 161/98 | Ht 68.0 in | Wt 328.0 lb

## 2012-12-24 DIAGNOSIS — M1710 Unilateral primary osteoarthritis, unspecified knee: Secondary | ICD-10-CM

## 2012-12-24 DIAGNOSIS — M25561 Pain in right knee: Secondary | ICD-10-CM

## 2012-12-24 DIAGNOSIS — M25569 Pain in unspecified knee: Secondary | ICD-10-CM

## 2012-12-24 MED ORDER — MELOXICAM 15 MG PO TABS: 15.0000 mg | ORAL_TABLET | Freq: Every day | ORAL | Status: DC | PRN

## 2012-12-24 NOTE — Progress Notes (Signed)
  Subjective:    Patient ID: Beth [name], female    DOB: 1965-09-11, 47 y.o.   MRN: [id number]  HPI chief complaint: Bilateral knee pain  47 year old female comes in today at the request of her primary care physician for evaluation of chronic bilateral knee pain. She states that she's had pain for the past 2 years. No trauma. No prior knee surgeries. She describes a diffuse ache in the knees which is constantly present. Intermittent swelling and stiffness. She recently had x-rays ordered by her PCP which are available for review. She has had a single cortisone injection in the left knee 3 months ago which was helpful. She is also tried Mobic in the past which was very helpful but she is currently out of it. She's tried Ultram without good relief. No fevers or chills. She does get occasional instability in each knee.  Past medical history and current medications are reviewed No known drug allergies    Review of Systems     Objective:   Physical Exam Obese, no acute distress. Awake alert and oriented x3. Vital signs are reviewed.  Examination of each knee shows range of motion from 0-110. 1+ boggy synovitis. Diffuse tenderness to palpation along medial and lateral joint lines. 1+ patellofemoral crepitus. Mild varus deformity. Negative McMurray's. Good joint stability. Neurovascularly intact distally. Walking with the assistance of a cane.  X-rays of each of her knees are reviewed. Left knee x-ray is dated 09/01/2012. Right knee is dated 10/20/2012. Both knees have advanced patellofemoral and medial compartmental DJD. These films are non-standing and my suspicion is that if these films were to be repeated in the standing position that she would approach bone-on-bone medial compartmental DJD. Nothing acute is seen.       Assessment & Plan:  1. Bilateral knee pain secondary to advanced DJD 2. obesity  Definitive treatment is a total knee arthroplasty. Since she has had good success with  Mobic in the past I will try her on 15 mg daily as needed for pain. With her multiple medical comorbidities and obesity surgery would be a challenge. In addition to Mobic I'm going to send her for some physical therapy and will reevaluate her in 4 weeks. I would consider repeat cortisone injections if needed down the road. I explained to her the role that obesity is playing in her ongoing knee pain in the importance of weight loss in the overall health of her knees.

## 2012-12-25 ENCOUNTER — Ambulatory Visit (HOSPITAL_COMMUNITY)
Admission: RE | Admit: 2012-12-25 | Discharge: 2012-12-25 | Disposition: A | Discharge status: Home / Self Care | Payer: Self-pay | Source: Ambulatory Visit | Attending: Family Medicine | Admitting: Family Medicine

## 2012-12-25 DIAGNOSIS — Z1239 Encounter for other screening for malignant neoplasm of breast: Secondary | ICD-10-CM

## 2012-12-25 DIAGNOSIS — Z1231 Encounter for screening mammogram for malignant neoplasm of breast: Secondary | ICD-10-CM | POA: Insufficient documentation

## 2012-12-30 NOTE — Progress Notes (Signed)
Quick Note:  Please inform patient No mammographic evidence of malignancy. A result letter of this screening mammogram will be mailed directly to the patient.  RECOMMENDATION: Screening mammogram in one year.  C. L. Johnson, MD, CDE, FAAFP [facility] [employer] [place], [place]    ______ 

## 2013-01-01 ENCOUNTER — Telehealth: Payer: Self-pay

## 2013-01-01 NOTE — Telephone Encounter (Signed)
Patient aware of mamogram results

## 2013-01-01 NOTE — Telephone Encounter (Signed)
Message copied by Lestine Mount on Thu Jan 01, 2013 11:36 AM ------      Message from: Cleora Fleet      Created: Tue Dec 30, 2012  3:19 PM       Please inform patient No mammographic evidence of malignancy.  A result letter of this screening mammogram will be mailed directly to the patient.            RECOMMENDATION:      Screening mammogram in one year.             Rodney Langton, MD, CDE, FAAFP      [facility]      [facility]      [place], [place]              ------

## 2013-01-06 ENCOUNTER — Ambulatory Visit: Payer: No Typology Code available for payment source | Attending: Sports Medicine | Admitting: Physical Therapy

## 2013-01-06 DIAGNOSIS — M2560 Stiffness of unspecified joint, not elsewhere classified: Secondary | ICD-10-CM | POA: Insufficient documentation

## 2013-01-06 DIAGNOSIS — R262 Difficulty in walking, not elsewhere classified: Secondary | ICD-10-CM | POA: Insufficient documentation

## 2013-01-06 DIAGNOSIS — M25569 Pain in unspecified knee: Secondary | ICD-10-CM | POA: Insufficient documentation

## 2013-01-06 DIAGNOSIS — IMO0001 Reserved for inherently not codable concepts without codable children: Secondary | ICD-10-CM | POA: Insufficient documentation

## 2013-01-08 ENCOUNTER — Ambulatory Visit: Payer: No Typology Code available for payment source | Admitting: Rehabilitation

## 2013-01-13 ENCOUNTER — Ambulatory Visit: Payer: No Typology Code available for payment source | Admitting: Physical Therapy

## 2013-01-15 ENCOUNTER — Ambulatory Visit: Payer: No Typology Code available for payment source | Admitting: Rehabilitation

## 2013-01-19 ENCOUNTER — Ambulatory Visit: Payer: No Typology Code available for payment source | Admitting: Rehabilitation

## 2013-01-22 ENCOUNTER — Ambulatory Visit: Payer: No Typology Code available for payment source | Admitting: Rehabilitation

## 2013-01-26 ENCOUNTER — Ambulatory Visit (INDEPENDENT_AMBULATORY_CARE_PROVIDER_SITE_OTHER): Payer: Self-pay | Admitting: Sports Medicine

## 2013-01-26 ENCOUNTER — Encounter: Payer: Self-pay | Admitting: Sports Medicine

## 2013-01-26 VITALS — BP 156/96 | HR 84 | Ht 68.0 in | Wt 328.0 lb

## 2013-01-26 DIAGNOSIS — M1712 Unilateral primary osteoarthritis, left knee: Secondary | ICD-10-CM

## 2013-01-26 DIAGNOSIS — M1710 Unilateral primary osteoarthritis, unspecified knee: Secondary | ICD-10-CM

## 2013-01-26 DIAGNOSIS — IMO0002 Reserved for concepts with insufficient information to code with codable children: Secondary | ICD-10-CM

## 2013-01-26 DIAGNOSIS — M1711 Unilateral primary osteoarthritis, right knee: Secondary | ICD-10-CM

## 2013-01-26 NOTE — Assessment & Plan Note (Addendum)
Pt with extensive B/L knee OA seen on recent X-rays from March/April 2014.  Had B/L joint injection done in beginning of July with some success and has been going to PT as well with success. Her pain is improved B/L from 7 down to 5-6 on a good day and will continue to go to therapy until the end of August.  Discussed benefit of joint injection and also the risks and that after joint injection the means to treatment will be B/L TKA.  Due to her lack of insurance, this is not a good option at this point along with the fact that she is 47 y/o.  We discussed and decided to hold off repeating injections at this point with the option to come back in for procedure if she needs to over the next couple of weeks.  Continue with PT and mobic and will see back in 4 weeks.

## 2013-01-26 NOTE — Progress Notes (Signed)
Beth [name] is a 47 y.o. female who presents today for B/L knee OA f/u.  Pt has been going to physical therapy over the past several weeks with good results.  She is going about 2 x per week and states her pain is improved (7 down to 5-6 B/L) since starting PT in the beginning of July.  The mobic she is taking, 15 mg qd, is working well for her as well in controlling her pain.  Her main concern today is that she is unable to perform her ADL or work and that her disability has been denied again. Recent x-rays demonstrate approaching bone-on-bone medial compartmental DJD and significant patellofemoral DJD.    Current Outpatient Prescriptions on File Prior to Visit  Medication Sig Dispense Refill  . albuterol (PROVENTIL HFA;VENTOLIN HFA) 108 (90 BASE) MCG/ACT inhaler Inhale 2 puffs into the lungs every 4 (four) hours as needed. For shortness of breath  1 Inhaler  2  . ARIPiprazole (ABILIFY) 10 MG tablet Take 10 mg by mouth daily.      Marland Kitchen atorvastatin (LIPITOR) 10 MG tablet Take 1 tablet (10 mg total) by mouth daily.  30 tablet  2  . DULoxetine (CYMBALTA) 60 MG capsule Take 60 mg by mouth daily.      . ergocalciferol (DRISDOL) 50000 UNITS capsule Take 1 capsule (50,000 Units total) by mouth once a week.  8 capsule  0  . Fluticasone-Salmeterol (ADVAIR) 250-50 MCG/DOSE AEPB Inhale 1 puff into the lungs 2 (two) times daily as needed. For shortness of breath  60 each  2  . gabapentin (NEURONTIN) 600 MG tablet Take 600 mg by mouth 3 (three) times daily.       Marland Kitchen glucose monitoring kit (FREESTYLE) monitoring kit 1 each by Does not apply route 4 (four) times daily - after meals and at bedtime. 1 month Diabetic Testing Supplies for QAC-QHS accuchecks.  1 each  1  . hydrochlorothiazide (HYDRODIURIL) 12.5 MG tablet Take 2 tablets (25 mg total) by mouth daily.  30 tablet  3  . loratadine (CLARITIN) 10 MG tablet Take 1 tablet (10 mg total) by mouth daily.  30 tablet  0  . meloxicam (MOBIC) 15 MG tablet Take 1  tablet (15 mg total) by mouth daily as needed for pain. Take with food  60 tablet  1  . metFORMIN (GLUCOPHAGE) 500 MG tablet Take 1 tablet (500 mg total) by mouth 2 (two) times daily with a meal.  30 tablet  2  . metoprolol (LOPRESSOR) 100 MG tablet Take 200 mg by mouth daily.      . metroNIDAZOLE (FLAGYL) 500 MG tablet Take 1 tablet (500 mg total) by mouth 2 (two) times daily.  14 tablet  0  . pantoprazole (PROTONIX) 40 MG tablet Take 1 tablet (40 mg total) by mouth daily.  30 tablet  2  . sertraline (ZOLOFT) 100 MG tablet Take 1 tablet (100 mg total) by mouth daily.  30 tablet  2  . [DISCONTINUED] buPROPion (WELLBUTRIN XL) 150 MG 24 hr tablet Take 150 mg by mouth daily.        . [DISCONTINUED] metoprolol succinate (TOPROL-XL) 100 MG 24 hr tablet Take 1 tablet (100 mg total) by mouth daily.  30 tablet  2  . [DISCONTINUED] pravastatin (PRAVACHOL) 40 MG tablet Take 40 mg by mouth daily.        . [DISCONTINUED] risperiDONE (RISPERDAL) 2 MG tablet Take 2 mg by mouth at bedtime.  No current facility-administered medications on file prior to visit.    ROS: Per HPI.  All other systems reviewed and are negative.   Physical Exam Filed Vitals:   01/26/13 1501  BP: 156/96  Pulse: 84    Physical Examination: Knees -examination of the knee shows range of motion from 5-120 bilaterally. 1+ boggy synovitis bilaterally. There is tenderness to palpation along medial and lateral joint lines. Joints are grossly stable to ligament is exam. Neurovascularly intact distally. Walking with a significant limp.

## 2013-01-26 NOTE — Patient Instructions (Addendum)
Massa, please continue with your Physical therapy and the mobic.  We will see you back in 4 weeks.

## 2013-01-27 ENCOUNTER — Ambulatory Visit: Payer: No Typology Code available for payment source | Admitting: Rehabilitation

## 2013-01-28 ENCOUNTER — Ambulatory Visit: Payer: No Typology Code available for payment source | Attending: Family Medicine | Admitting: Internal Medicine

## 2013-01-28 VITALS — BP 142/93 | HR 105 | Temp 99.0°F | Resp 18 | Ht 68.0 in | Wt 330.2 lb

## 2013-01-28 DIAGNOSIS — I10 Essential (primary) hypertension: Secondary | ICD-10-CM | POA: Insufficient documentation

## 2013-01-28 MED ORDER — HYDROCHLOROTHIAZIDE 12.5 MG PO TABS: 25.0000 mg | ORAL_TABLET | Freq: Every day | ORAL | Status: DC

## 2013-01-28 MED ORDER — ATENOLOL 25 MG PO TABS: 25.0000 mg | ORAL_TABLET | Freq: Every day | ORAL | Status: DC

## 2013-01-28 NOTE — Progress Notes (Signed)
Patient ID: Beth [name], female   DOB: 05-Feb-1966, 47 y.o.   MRN: [id number] Patient Demographics  Beth [name], is a 47 y.o. female  WUJ:[id number]  [id number]  DOB - 1966-03-12  No chief complaint on file.       Subjective:   [name] today is here for a follow up visit. has headache, BP 142/93, ran out of meds, hasnot taken BP meds today.   , No chest pain, No abdominal pain - No Nausea, No new weakness tingling or numbness, No Cough - SOB.   Objective:    Filed Vitals:   01/28/13 1358  BP: 142/93  Pulse: 105  Temp: 99 F (37.2 C)  TempSrc: Oral  Resp: 18  Height: 5\' 8"  (1.727 m)  Weight: 330 lb 3.2 oz (149.778 kg)  SpO2: 96%     ALLERGIES:  No Known Allergies  PAST MEDICAL HISTORY: Past Medical History  Diagnosis Date  . Diabetes mellitus   . Hypertension   . Asthma   . Arthritis   . Coronary artery disease   . Hyperlipidemia   . Phlebitis   . Asthmatic bronchitis , chronic   . Shortness of breath 05/23/11    "exertion; laying down"  . Depression   . Anxiety     MEDICATIONS AT HOME: Prior to Admission medications   Medication Sig Start Date End Date Taking? Authorizing Provider  albuterol (PROVENTIL HFA;VENTOLIN HFA) 108 (90 BASE) MCG/ACT inhaler Inhale 2 puffs into the lungs every 4 (four) hours as needed. For shortness of breath 08/28/12  Yes Leroy Sea, MD  ARIPiprazole (ABILIFY) 10 MG tablet Take 10 mg by mouth daily.   Yes Historical Provider, MD  atorvastatin (LIPITOR) 10 MG tablet Take 1 tablet (10 mg total) by mouth daily. 08/28/12  Yes Leroy Sea, MD  DULoxetine (CYMBALTA) 60 MG capsule Take 60 mg by mouth daily.   Yes Historical Provider, MD  ergocalciferol (DRISDOL) 50000 UNITS capsule Take 1 capsule (50,000 Units total) by mouth once a week. 12/19/12  Yes Clanford Cyndie Mull, MD  Fluticasone-Salmeterol (ADVAIR) 250-50 MCG/DOSE AEPB Inhale 1 puff into the lungs 2 (two) times daily as needed. For shortness of breath 08/28/12   Yes Leroy Sea, MD  gabapentin (NEURONTIN) 600 MG tablet Take 600 mg by mouth 3 (three) times daily.    Yes Historical Provider, MD  glucose monitoring kit (FREESTYLE) monitoring kit 1 each by Does not apply route 4 (four) times daily - after meals and at bedtime. 1 month Diabetic Testing Supplies for QAC-QHS accuchecks. 08/28/12  Yes Leroy Sea, MD  hydrochlorothiazide (HYDRODIURIL) 12.5 MG tablet Take 2 tablets (25 mg total) by mouth daily. 01/28/13  Yes Dore Oquin Jenna Luo, MD  loratadine (CLARITIN) 10 MG tablet Take 1 tablet (10 mg total) by mouth daily. 08/28/12  Yes Leroy Sea, MD  meloxicam (MOBIC) 15 MG tablet Take 1 tablet (15 mg total) by mouth daily as needed for pain. Take with food 12/24/12  Yes Ralene Cork, DO  metFORMIN (GLUCOPHAGE) 500 MG tablet Take 1 tablet (500 mg total) by mouth 2 (two) times daily with a meal. 08/28/12  Yes Leroy Sea, MD  pantoprazole (PROTONIX) 40 MG tablet Take 1 tablet (40 mg total) by mouth daily. 08/28/12  Yes Leroy Sea, MD  sertraline (ZOLOFT) 100 MG tablet Take 1 tablet (100 mg total) by mouth daily. 08/28/12  Yes Leroy Sea, MD  atenolol (TENORMIN) 25 MG tablet Take 1 tablet (25  mg total) by mouth daily. 01/28/13   Marlisa Caridi Jenna Luo, MD     Exam  General appearance :Awake, alert, NAD, Speech Clear.  HEENT: Atraumatic and Normocephalic, PERLA Neck: supple, no JVD. No cervical lymphadenopathy.  Chest: Clear to auscultation bilaterally, no wheezing, rales or rhonchi CVS: S1 S2 regular, no murmurs.  Abdomen: soft, NBS, NT, ND, no gaurding, rigidity or rebound. Extremities: no cyanosis or clubbing, B/L Lower Ext shows no edema Neurology: Awake alert, and oriented X 3, CN II-XII intact, Non focal Skin: No Rash or lesions Wounds:N/A    Data Review   Basic Metabolic Panel: No results found for this basename: NA, K, CL, CO2, GLUCOSE, BUN, CREATININE, CALCIUM, MG, PHOS,  in the last 168 hours Liver Function Tests: No results  found for this basename: AST, ALT, ALKPHOS, BILITOT, PROT, ALBUMIN,  in the last 168 hours  CBC: No results found for this basename: WBC, NEUTROABS, HGB, HCT, MCV, PLT,  in the last 168 hours  ------------------------------------------------------------------------------------------------------------------ No results found for this basename: HGBA1C,  in the last 72 hours ------------------------------------------------------------------------------------------------------------------ No results found for this basename: CHOL, HDL, LDLCALC, TRIG, CHOLHDL, LDLDIRECT,  in the last 72 hours ------------------------------------------------------------------------------------------------------------------ No results found for this basename: TSH, T4TOTAL, FREET3, T3FREE, THYROIDAB,  in the last 72 hours ------------------------------------------------------------------------------------------------------------------ No results found for this basename: VITAMINB12, FOLATE, FERRITIN, TIBC, IRON, RETICCTPCT,  in the last 72 hours  Coagulation profile  No results found for this basename: INR, PROTIME,  in the last 168 hours    Assessment & Plan   Active Problems: Hypertension: uncontrolled, ran out of meds - Refilled HCTZ 25mg  daily, started on atenolol 25 mg daily  Patient has refills for all other medications    Follow-up in 3 months      Jerriah Ines M.D. 01/28/2013, 2:16 PM

## 2013-01-28 NOTE — Progress Notes (Signed)
01/28/13 Present to clinic for follow up and  Medication change P[facility] BSN [facility]

## 2013-01-29 ENCOUNTER — Ambulatory Visit: Payer: No Typology Code available for payment source | Attending: Sports Medicine | Admitting: Rehabilitation

## 2013-01-29 DIAGNOSIS — IMO0001 Reserved for inherently not codable concepts without codable children: Secondary | ICD-10-CM | POA: Insufficient documentation

## 2013-01-29 DIAGNOSIS — M25569 Pain in unspecified knee: Secondary | ICD-10-CM | POA: Insufficient documentation

## 2013-01-29 DIAGNOSIS — R262 Difficulty in walking, not elsewhere classified: Secondary | ICD-10-CM | POA: Insufficient documentation

## 2013-01-29 DIAGNOSIS — M2560 Stiffness of unspecified joint, not elsewhere classified: Secondary | ICD-10-CM | POA: Insufficient documentation

## 2013-02-02 ENCOUNTER — Ambulatory Visit: Payer: No Typology Code available for payment source | Admitting: Physical Therapy

## 2013-02-04 ENCOUNTER — Telehealth: Payer: Self-pay | Admitting: *Deleted

## 2013-02-04 ENCOUNTER — Ambulatory Visit: Payer: No Typology Code available for payment source | Admitting: Rehabilitation

## 2013-02-04 NOTE — Telephone Encounter (Signed)
Advised pt, per Dr. Margaretha Sheffield he does not do disability letters, she will need to request from PCP. Pt agreeable.

## 2013-02-04 NOTE — Telephone Encounter (Signed)
Message copied by Mora Bellman on Wed Feb 04, 2013  4:48 PM ------      Message from: HILL, TIA C      Created: Wed Feb 04, 2013  4:11 PM      Contact: [phone]       Patient is applying for disability and calls stating she needs a "Letter of Disability" Asked patient what letter needed to say, she stated that her attorney told her she needed a letter discussing her knee issues.  ------

## 2013-02-09 ENCOUNTER — Encounter: Payer: No Typology Code available for payment source | Admitting: Rehabilitation

## 2013-02-16 ENCOUNTER — Ambulatory Visit (INDEPENDENT_AMBULATORY_CARE_PROVIDER_SITE_OTHER): Payer: Self-pay | Admitting: Sports Medicine

## 2013-02-16 ENCOUNTER — Encounter: Payer: Self-pay | Admitting: Sports Medicine

## 2013-02-16 VITALS — BP 137/103 | HR 108 | Ht 68.0 in | Wt 330.0 lb

## 2013-02-16 DIAGNOSIS — M25561 Pain in right knee: Secondary | ICD-10-CM

## 2013-02-16 DIAGNOSIS — M25569 Pain in unspecified knee: Secondary | ICD-10-CM

## 2013-02-16 DIAGNOSIS — M1710 Unilateral primary osteoarthritis, unspecified knee: Secondary | ICD-10-CM

## 2013-02-16 MED ORDER — METHYLPREDNISOLONE ACETATE 40 MG/ML IJ SUSP
40.0000 mg | Freq: Once | INTRAMUSCULAR | Status: AC
  Administered 2013-02-16: 40 mg via INTRA_ARTICULAR

## 2013-02-17 NOTE — Progress Notes (Signed)
  Subjective:    Patient ID: Beth [name], female    DOB: [birth year], 47 y.o.   MRN: [id number]  HPI Patient comes in today for followup on bilateral knee DJD. She is here today for cortisone injections. She is received a previous injection in the left knee with good results in the past. Recent x-rays done in the spring showed a protein bone-on-bone DJD in the medial compartment of each knee. She denies any recent trauma. She has graduated from physical therapy and is now doing her home exercise program. She takes Mobic daily as well.    Review of Systems     Objective:   Physical Exam Morbidly obese. No acute distress.  Examination of each knee shows range of motion from 0-110 bilaterally. 1+ synovitis. Tenderness to palpation along medial and lateral joint lines. Negative McMurray's. Knees are grossly stable to ligamentous exam. Neurovascular intact distally. Walking with a limp.      Assessment & Plan:  1. Bilateral knee pain secondary to approaching end-stage DJD 2. Obesity  Each of her knees was injected today with cortisone. She tolerated this without difficulty. Risks and benefits were explained prior to injection including risk of transient hyperglycemia. Left knee was injected using an anterior lateral approach and right knee was injected using an anterior medial approach. She'll continue on her Mobic. Continue her home exercises. Again, definitive treatment is a total knee arthroplasty but her young age and morbid obesity currently precludes her from this. Followup PRN.  Consent obtained and verified. Time-out conducted. Noted no overlying erythema, induration, or other signs of local infection. Skin prepped in a sterile fashion. Topical analgesic spray: Ethyl chloride. Joint: left knee Needle: 25g 1 1/2 inch Completed without difficulty. Meds: 3cc 1% xylocaine, 1cc (40mg ) depomedrol  Consent obtained and verified. Time-out conducted. Noted no overlying erythema,  induration, or other signs of local infection. Skin prepped in a sterile fashion. Topical analgesic spray: Ethyl chloride. Joint: right knee Needle: 25g 1 1/2 inch Completed without difficulty. Meds: 3cc 1%xylocaine, 1cc (40mg ) depomedrol  Advised to call if fevers/chills, erythema, induration, drainage, or persistent bleeding.   Advised to call if fevers/chills, erythema, induration, drainage, or persistent bleeding.

## 2013-03-02 ENCOUNTER — Telehealth: Payer: Self-pay | Admitting: Emergency Medicine

## 2013-03-02 NOTE — Telephone Encounter (Signed)
Pt calling concerning dermatology referral  Last visit 01/27/13 states Dr. Isidoro Donning wanted placed referral but not seen. Will route to Dr. Isidoro Donning

## 2013-03-16 ENCOUNTER — Ambulatory Visit: Payer: No Typology Code available for payment source | Admitting: Sports Medicine

## 2013-04-20 ENCOUNTER — Ambulatory Visit: Payer: No Typology Code available for payment source

## 2013-04-30 ENCOUNTER — Other Ambulatory Visit: Payer: Self-pay

## 2013-04-30 ENCOUNTER — Encounter: Payer: Self-pay | Admitting: Internal Medicine

## 2013-04-30 ENCOUNTER — Ambulatory Visit: Payer: Self-pay | Attending: Internal Medicine | Admitting: Internal Medicine

## 2013-04-30 VITALS — BP 136/83 | HR 92 | Temp 98.0°F | Resp 18 | Wt 326.0 lb

## 2013-04-30 DIAGNOSIS — G629 Polyneuropathy, unspecified: Secondary | ICD-10-CM

## 2013-04-30 DIAGNOSIS — G589 Mononeuropathy, unspecified: Secondary | ICD-10-CM

## 2013-04-30 DIAGNOSIS — M25569 Pain in unspecified knee: Secondary | ICD-10-CM

## 2013-04-30 DIAGNOSIS — J45909 Unspecified asthma, uncomplicated: Secondary | ICD-10-CM | POA: Insufficient documentation

## 2013-04-30 DIAGNOSIS — E669 Obesity, unspecified: Secondary | ICD-10-CM

## 2013-04-30 DIAGNOSIS — I10 Essential (primary) hypertension: Secondary | ICD-10-CM

## 2013-04-30 DIAGNOSIS — E119 Type 2 diabetes mellitus without complications: Secondary | ICD-10-CM

## 2013-04-30 DIAGNOSIS — Z0000 Encounter for general adult medical examination without abnormal findings: Secondary | ICD-10-CM | POA: Insufficient documentation

## 2013-04-30 DIAGNOSIS — Z23 Encounter for immunization: Secondary | ICD-10-CM

## 2013-04-30 DIAGNOSIS — G8929 Other chronic pain: Secondary | ICD-10-CM

## 2013-04-30 LAB — LIPID PANEL
Cholesterol: 273 mg/dL — ABNORMAL HIGH (ref 0–200)
HDL: 38 mg/dL — ABNORMAL LOW (ref >39)
Triglycerides: 151 mg/dL — ABNORMAL HIGH (ref <150)
VLDL: 30 mg/dL (ref 0–40)

## 2013-04-30 LAB — POCT GLUCOSE (DEVICE FOR HOME USE)

## 2013-04-30 MED ORDER — SERTRALINE HCL 100 MG PO TABS: 100.0000 mg | ORAL_TABLET | Freq: Every day | ORAL | Status: DC

## 2013-04-30 MED ORDER — PANTOPRAZOLE SODIUM 40 MG PO TBEC: 40.0000 mg | DELAYED_RELEASE_TABLET | Freq: Every day | ORAL | Status: DC

## 2013-04-30 MED ORDER — GABAPENTIN 600 MG PO TABS: 600.0000 mg | ORAL_TABLET | Freq: Three times a day (TID) | ORAL | Status: AC

## 2013-04-30 MED ORDER — ATORVASTATIN CALCIUM 10 MG PO TABS: 10.0000 mg | ORAL_TABLET | Freq: Every day | ORAL | Status: DC

## 2013-04-30 MED ORDER — LORATADINE 10 MG PO TABS: 10.0000 mg | ORAL_TABLET | Freq: Every day | ORAL | Status: DC

## 2013-04-30 MED ORDER — FLUTICASONE-SALMETEROL 250-50 MCG/DOSE IN AEPB: 1.0000 | INHALATION_SPRAY | Freq: Two times a day (BID) | RESPIRATORY_TRACT | Status: DC | PRN

## 2013-04-30 MED ORDER — MELOXICAM 15 MG PO TABS: 15.0000 mg | ORAL_TABLET | Freq: Every day | ORAL | Status: DC | PRN

## 2013-04-30 MED ORDER — METFORMIN HCL 500 MG PO TABS: 500.0000 mg | ORAL_TABLET | Freq: Two times a day (BID) | ORAL | Status: DC

## 2013-04-30 MED ORDER — ALBUTEROL SULFATE HFA 108 (90 BASE) MCG/ACT IN AERS: 2.0000 | INHALATION_SPRAY | RESPIRATORY_TRACT | Status: DC | PRN

## 2013-04-30 MED ORDER — HYDROCHLOROTHIAZIDE 12.5 MG PO TABS: 25.0000 mg | ORAL_TABLET | Freq: Every day | ORAL | Status: DC

## 2013-04-30 MED ORDER — ATENOLOL 25 MG PO TABS: 25.0000 mg | ORAL_TABLET | Freq: Every day | ORAL | Status: DC

## 2013-04-30 MED ORDER — DULOXETINE HCL 60 MG PO CPEP: 60.0000 mg | ORAL_CAPSULE | Freq: Every day | ORAL | Status: DC

## 2013-04-30 MED ORDER — ARIPIPRAZOLE 10 MG PO TABS: 10.0000 mg | ORAL_TABLET | Freq: Every day | ORAL | Status: DC

## 2013-04-30 NOTE — Progress Notes (Signed)
Patient ID: Beth [name], female   DOB: [birth year], 47 y.o.   MRN: [id number]  CC: Followup  HPI: 47 year old female with multiple medical issues including but not limited to diabetes, hypertension, asthma, dyslipidemia who presented to clinic for followup. Patient reports no complaints this afternoon. No chest pain or shortness of breath. No palpitations. No abdominal pain, nausea or vomiting. No polydipsia or polyuria.  No Known Allergies Past Medical History  Diagnosis Date  . Diabetes mellitus   . Hypertension   . Asthma   . Arthritis   . Coronary artery disease   . Hyperlipidemia   . Phlebitis   . Asthmatic bronchitis , chronic   . Shortness of breath 05/23/11    "exertion; laying down"  . Depression   . Anxiety    Current Outpatient Prescriptions on File Prior to Visit  Medication Sig Dispense Refill  . ergocalciferol (DRISDOL) 50000 UNITS capsule Take 1 capsule (50,000 Units total) by mouth once a week.  8 capsule  0  . glucose monitoring kit (FREESTYLE) monitoring kit 1 each by Does not apply route 4 (four) times daily - after meals and at bedtime. 1 month Diabetic Testing Supplies for QAC-QHS accuchecks.  1 each  1  . [DISCONTINUED] buPROPion (WELLBUTRIN XL) 150 MG 24 hr tablet Take 150 mg by mouth daily.        . [DISCONTINUED] metoprolol succinate (TOPROL-XL) 100 MG 24 hr tablet Take 1 tablet (100 mg total) by mouth daily.  30 tablet  2  . [DISCONTINUED] pravastatin (PRAVACHOL) 40 MG tablet Take 40 mg by mouth daily.        . [DISCONTINUED] risperiDONE (RISPERDAL) 2 MG tablet Take 2 mg by mouth at bedtime.         No current facility-administered medications on file prior to visit.   Asthma in mother.  History   Social History  . Marital Status: Legally Separated    Spouse Name: N/A    Number of Children: N/A  . Years of Education: N/A   Occupational History  . Not on file.   Social History Main Topics  . Smoking status: Former Smoker -- 0.25 packs/day for  19 years    Types: Cigarettes  . Smokeless tobacco: Never Used  . Alcohol Use: Yes     Comment: "quit alcohol ~ 2005; only then drank occasionl wine cooler"  . Drug Use: Yes    Special: Cocaine, Marijuana, "Crack" cocaine  . Sexual Activity: No     Comment: "quit smoking cigarettes ~2005"   Other Topics Concern  . Not on file   Social History Narrative  . No narrative on file    Review of Systems  Constitutional: Negative for fever, chills, diaphoresis, activity change, appetite change and fatigue.  HENT: Negative for ear pain, nosebleeds, congestion, facial swelling, rhinorrhea, neck pain, neck stiffness and ear discharge.   Eyes: Negative for pain, discharge, redness, itching and visual disturbance.  Respiratory: Negative for cough, choking, chest tightness, shortness of breath, wheezing and stridor.   Cardiovascular: Negative for chest pain, palpitations and leg swelling.  Gastrointestinal: Negative for abdominal distention.  Genitourinary: Negative for dysuria, urgency, frequency, hematuria, flank pain, decreased urine volume, difficulty urinating and dyspareunia.  Musculoskeletal: Negative for back pain, joint swelling, arthralgias and gait problem.  Neurological: Negative for dizziness, tremors, seizures, syncope, facial asymmetry, speech difficulty, weakness, light-headedness, numbness and headaches.  Hematological: Negative for adenopathy. Does not bruise/bleed easily.  Psychiatric/Behavioral: Negative for hallucinations, behavioral problems, confusion, dysphoric mood, decreased  concentration and agitation.    Objective:   Filed Vitals:   04/30/13 1217  BP: 136/83  Pulse: 92  Temp: 98 F (36.7 C)  Resp: 18    Physical Exam  Constitutional: Appears well-developed and well-nourished. No distress.  HENT: Normocephalic. External right and left ear normal. Oropharynx is clear and moist.  Eyes: Conjunctivae and EOM are normal. PERRLA, no scleral icterus.  Neck: Normal  ROM. Neck supple. No JVD. No tracheal deviation. No thyromegaly.  CVS: RRR, S1/S2 +, no murmurs, no gallops, no carotid bruit.  Pulmonary: Effort and breath sounds normal, no stridor, rhonchi, wheezes, rales.  Abdominal: Soft. BS +,  no distension, tenderness, rebound or guarding.  Musculoskeletal: Normal range of motion. No edema and no tenderness.  Lymphadenopathy: No lymphadenopathy noted, cervical, inguinal. Neuro: Alert. Normal reflexes, muscle tone coordination. No cranial nerve deficit. Skin: Skin is warm and dry. No rash noted. Not diaphoretic. No erythema. No pallor.  Psychiatric: Normal mood and affect. Behavior, judgment, thought content normal.   Lab Results  Component Value Date   WBC 11.0* 12/05/2012   HGB 12.7 12/05/2012   HCT 37.8 12/05/2012   MCV 81.1 12/05/2012   PLT 349 12/05/2012   Lab Results  Component Value Date   CREATININE 0.78 12/05/2012   BUN 8 12/05/2012   NA 139 12/05/2012   K 3.8 12/05/2012   CL 104 12/05/2012   CO2 28 12/05/2012    Lab Results  Component Value Date   HGBA1C 5.9* 12/05/2012   Lipid Panel     Component Value Date/Time   CHOL 166 02/21/2009 2042   TRIG 134 02/21/2009 2042   HDL 34* 02/21/2009 2042   CHOLHDL 4.9 Ratio 02/21/2009 2042   VLDL 27 02/21/2009 2042   LDLCALC 105* 02/21/2009 2042       Assessment and plan:   Patient Active Problem List   Diagnosis Date Noted  . Preventative health care 04/30/2013    Priority: Medium - GYN referral  - Flu shot   . Asthma 09/19/2012    Priority: Medium - Stable, continue albuterol inhaler   . Chronic knee pain 09/19/2012    Priority: Medium - Continue Cymbalta and Abilify   . DM type 2 (diabetes mellitus, type 2) 05/25/2011    Priority: Medium - Check A1c today  - Continue metformin 500 mg twice daily   . Depression 05/23/2011    Priority: Medium - Continue Zoloft   . Neuropathy 05/23/2011    Priority: Medium - Continue Gabapentin 600 mg 2 times a day   . HYPERLIPIDEMIA 08/22/2006     Priority: Medium - Check lipid panel today  - Continue atorvastatin   . HYPERTENSION, BENIGN SYSTEMIC 08/22/2006    Priority: Medium - We have discussed target BP range - I have advised pt to check BP regularly and to call us back if the numbers are higher than 140/90 - discussed the importance of compliance with medical therapy and diet  - Continue HCTZ and atenolol

## 2013-04-30 NOTE — Progress Notes (Signed)
Pt is here for a f/u on HTN Needing refills on meds C/o excessive perspiration onset 2 months Alert w/no signs of acute distress.

## 2013-04-30 NOTE — Patient Instructions (Signed)

## 2013-05-01 LAB — TSH: TSH: 4.361 u[IU]/mL (ref 0.350–4.500)

## 2013-05-06 ENCOUNTER — Encounter: Payer: Self-pay | Admitting: Obstetrics & Gynecology

## 2013-05-11 ENCOUNTER — Ambulatory Visit: Payer: No Typology Code available for payment source | Attending: Internal Medicine

## 2013-05-25 ENCOUNTER — Ambulatory Visit: Payer: No Typology Code available for payment source | Attending: Internal Medicine

## 2013-05-26 ENCOUNTER — Other Ambulatory Visit: Payer: Self-pay | Admitting: Internal Medicine

## 2013-05-26 MED ORDER — ALBUTEROL SULFATE HFA 108 (90 BASE) MCG/ACT IN AERS: 2.0000 | INHALATION_SPRAY | RESPIRATORY_TRACT | Status: DC | PRN

## 2013-05-26 MED ORDER — DULOXETINE HCL 60 MG PO CPEP: 60.0000 mg | ORAL_CAPSULE | Freq: Every day | ORAL | Status: DC

## 2013-05-26 MED ORDER — ARIPIPRAZOLE 10 MG PO TABS: 10.0000 mg | ORAL_TABLET | Freq: Every day | ORAL | Status: DC

## 2013-05-26 MED ORDER — FLUTICASONE-SALMETEROL 250-50 MCG/DOSE IN AEPB: 1.0000 | INHALATION_SPRAY | Freq: Two times a day (BID) | RESPIRATORY_TRACT | Status: DC | PRN

## 2013-06-08 ENCOUNTER — Encounter: Payer: Self-pay | Admitting: Obstetrics & Gynecology

## 2013-06-08 ENCOUNTER — Ambulatory Visit (INDEPENDENT_AMBULATORY_CARE_PROVIDER_SITE_OTHER): Payer: No Typology Code available for payment source | Admitting: Obstetrics & Gynecology

## 2013-06-08 VITALS — BP 120/85 | HR 86 | Temp 97.2°F | Ht 68.0 in | Wt 324.0 lb

## 2013-06-08 DIAGNOSIS — N939 Abnormal uterine and vaginal bleeding, unspecified: Secondary | ICD-10-CM

## 2013-06-08 DIAGNOSIS — N926 Irregular menstruation, unspecified: Secondary | ICD-10-CM

## 2013-06-08 NOTE — Patient Instructions (Signed)

## 2013-06-08 NOTE — Progress Notes (Signed)
Patient ID: Beth [name], female   DOB: February 15, 1966, 47 y.o.   MRN: [id number]  Chief Complaint  Patient presents with  . Referral    from [facility]  . Hot Flashes    & mood swings; had a period in Oct     HPI [name] is a 47 y.o. female.  G0P0000 Patient's last menstrual period was 03/25/2013. First menses in 3 years, new onset of hot flushes and sweats  HPI  Past Medical History  Diagnosis Date  . Diabetes mellitus   . Hypertension   . Asthma   . Arthritis   . Coronary artery disease   . Hyperlipidemia   . Phlebitis   . Asthmatic bronchitis , chronic   . Shortness of breath 05/23/11    "exertion; laying down"  . Depression   . Anxiety     Past Surgical History  Procedure Laterality Date  . Hernia repair    . Umbilical hernia repair  05/2004  . Cholecystectomy    . Dilation and curettage of uterus      No family history on file.  Social History History  Substance Use Topics  . Smoking status: Former Smoker -- 0.25 packs/day for 19 years    Types: Cigarettes  . Smokeless tobacco: Never Used  . Alcohol Use: Yes     Comment: "quit alcohol ~ 2005; only then drank occasionl wine cooler"    No Known Allergies  Current Outpatient Prescriptions  Medication Sig Dispense Refill  . albuterol (PROVENTIL HFA;VENTOLIN HFA) 108 (90 BASE) MCG/ACT inhaler Inhale 2 puffs into the lungs every 4 (four) hours as needed for wheezing or shortness of breath. For shortness of breath  3 Inhaler  1 year  . ARIPiprazole (ABILIFY) 10 MG tablet Take 1 tablet (10 mg total) by mouth daily.  90 tablet  1 year  . atenolol (TENORMIN) 25 MG tablet Take 1 tablet (25 mg total) by mouth daily.  30 tablet  5  . atorvastatin (LIPITOR) 10 MG tablet Take 1 tablet (10 mg total) by mouth daily.  30 tablet  5  . DULoxetine (CYMBALTA) 60 MG capsule Take 1 capsule (60 mg total) by mouth daily.  90 capsule  1 year  . ergocalciferol (DRISDOL) 50000 UNITS capsule Take 1 capsule  (50,000 Units total) by mouth once a week.  8 capsule  0  . Fluticasone-Salmeterol (ADVAIR) 250-50 MCG/DOSE AEPB Inhale 1 puff into the lungs 2 (two) times daily as needed. For shortness of breath  60 each  1 year  . gabapentin (NEURONTIN) 600 MG tablet Take 1 tablet (600 mg total) by mouth 3 (three) times daily.  90 tablet  5  . glucose monitoring kit (FREESTYLE) monitoring kit 1 each by Does not apply route 4 (four) times daily - after meals and at bedtime. 1 month Diabetic Testing Supplies for QAC-QHS accuchecks.  1 each  1  . hydrochlorothiazide (HYDRODIURIL) 12.5 MG tablet Take 2 tablets (25 mg total) by mouth daily.  30 tablet  5  . loratadine (CLARITIN) 10 MG tablet Take 1 tablet (10 mg total) by mouth daily.  30 tablet  5  . meloxicam (MOBIC) 15 MG tablet Take 1 tablet (15 mg total) by mouth daily as needed for pain. Take with food  30 tablet  5  . metFORMIN (GLUCOPHAGE) 500 MG tablet Take 1 tablet (500 mg total) by mouth 2 (two) times daily with a meal.  60 tablet  5  . pantoprazole (PROTONIX)  40 MG tablet Take 1 tablet (40 mg total) by mouth daily.  30 tablet  5  . sertraline (ZOLOFT) 100 MG tablet Take 1 tablet (100 mg total) by mouth daily.  30 tablet  5  . [DISCONTINUED] buPROPion (WELLBUTRIN XL) 150 MG 24 hr tablet Take 150 mg by mouth daily.        . [DISCONTINUED] metoprolol succinate (TOPROL-XL) 100 MG 24 hr tablet Take 1 tablet (100 mg total) by mouth daily.  30 tablet  2  . [DISCONTINUED] pravastatin (PRAVACHOL) 40 MG tablet Take 40 mg by mouth daily.        . [DISCONTINUED] risperiDONE (RISPERDAL) 2 MG tablet Take 2 mg by mouth at bedtime.         No current facility-administered medications for this visit.    Review of Systems Review of Systems  Constitutional: Negative for fever.       Sweats and hot flushes  Genitourinary: Positive for menstrual problem.  Neurological: Negative for dizziness.    Blood pressure 120/85, pulse 86, temperature 97.2 F (36.2 C),  temperature source Oral, height 5\' 8"  (1.727 m), weight 324 lb (146.965 kg), last menstrual period 03/25/2013.  Physical Exam Physical Exam  Constitutional: She is oriented to person, place, and time. She appears well-developed. No distress.  Neurological: She is alert and oriented to person, place, and time.  Skin: Skin is warm and dry.  Psychiatric: She has a normal mood and affect. Her behavior is normal.    Data Reviewed notes  Assessment    Abnormal uterine bleeding, possibly postmenopausal. VMS     Plan    [facility] Pelvic US RTC 2-3 weeks        Macsen Nuttall 06/08/2013, 3:38 PM

## 2013-06-12 ENCOUNTER — Ambulatory Visit (HOSPITAL_COMMUNITY)
Admission: RE | Admit: 2013-06-12 | Discharge: 2013-06-12 | Disposition: A | Discharge status: Home / Self Care | Payer: No Typology Code available for payment source | Source: Ambulatory Visit | Attending: Obstetrics & Gynecology | Admitting: Obstetrics & Gynecology

## 2013-06-12 DIAGNOSIS — N938 Other specified abnormal uterine and vaginal bleeding: Secondary | ICD-10-CM | POA: Insufficient documentation

## 2013-06-12 DIAGNOSIS — N939 Abnormal uterine and vaginal bleeding, unspecified: Secondary | ICD-10-CM

## 2013-06-12 DIAGNOSIS — N949 Unspecified condition associated with female genital organs and menstrual cycle: Secondary | ICD-10-CM | POA: Insufficient documentation

## 2013-07-08 ENCOUNTER — Ambulatory Visit (INDEPENDENT_AMBULATORY_CARE_PROVIDER_SITE_OTHER): Payer: No Typology Code available for payment source | Admitting: Obstetrics & Gynecology

## 2013-07-08 ENCOUNTER — Encounter: Payer: Self-pay | Admitting: Obstetrics & Gynecology

## 2013-07-08 ENCOUNTER — Other Ambulatory Visit (HOSPITAL_COMMUNITY)
Admission: RE | Admit: 2013-07-08 | Discharge: 2013-07-08 | Disposition: A | Discharge status: Home / Self Care | Payer: No Typology Code available for payment source | Source: Ambulatory Visit | Attending: Obstetrics & Gynecology | Admitting: Obstetrics & Gynecology

## 2013-07-08 VITALS — BP 144/76 | HR 103 | Temp 97.2°F | Resp 20 | Ht 68.0 in | Wt 329.4 lb

## 2013-07-08 DIAGNOSIS — N8502 Endometrial intraepithelial neoplasia [EIN]: Secondary | ICD-10-CM | POA: Insufficient documentation

## 2013-07-08 DIAGNOSIS — N939 Abnormal uterine and vaginal bleeding, unspecified: Secondary | ICD-10-CM

## 2013-07-08 DIAGNOSIS — N926 Irregular menstruation, unspecified: Secondary | ICD-10-CM

## 2013-07-08 NOTE — Progress Notes (Signed)
Pt states she has a bump on outer vagina which is draining small amount of blood which is sometimes painful.

## 2013-07-08 NOTE — Progress Notes (Signed)
   Subjective:    Patient ID: Beth [name], female    DOB: 02/02/1966, 48 y.o.   MRN: [id number]  HPISmal vulvar abscess recently drained spontaneously. Had US, needs FSH. Still occasional spotting    Review of Systems  Constitutional: Negative.   Genitourinary: Positive for vaginal bleeding. Negative for vaginal discharge and pelvic pain.       Objective:   Physical Exam  Constitutional: No distress.  Pulmonary/Chest: Effort normal. No respiratory distress.  Genitourinary: Vagina normal. No vaginal discharge found.  Endometrial biopsy offered, performed  Skin: Skin is warm.  Psychiatric: Beth [name] has a normal mood and affect. Her behavior is normal.      CLINICAL DATA: Uterine bleeding.  EXAM:  TRANSABDOMINAL AND TRANSVAGINAL ULTRASOUND OF PELVIS  TECHNIQUE:  Both transabdominal and transvaginal ultrasound examinations of the  pelvis were performed. Transabdominal technique was performed for  global imaging of the pelvis including uterus, ovaries, adnexal  regions, and pelvic cul-de-sac. It was necessary to proceed with  endovaginal exam following the transabdominal exam to visualize the  uterus/ endometrium, ovaries.  COMPARISON: None  FINDINGS:  Uterus  Measurements: 8.6 x 5.0 by by 0.2 cm. No fibroids or other mass  visualized.  Endometrium  Thickness: 7.7 mm. No focal abnormality visualized.  Right ovary  Not visualized.  Left ovary  Measurements: 2.1 x 1.6 x 1.2 cm. Normal appearance/no adnexal mass,  however left ovary not well visualized. .  Other findings  No free fluid. Difficult exam secondary to patient's body habitus.  IMPRESSION:  Slightly limited exam due to patient's body habitus. No focal  abnormality identified  Electronically Signed  By: Maisie Fushomas Register  On: 06/12/2013 14:08   Patient given informed consent, signed copy in the chart, time out was performed. Appropriate time out taken. . The patient was placed in the lithotomy position and the  cervix brought into view with sterile speculum.  Portio of cervix cleansed x 2 with betadine swabs.  A tenaculum was placed in the anterior lip of the cervix.  The uterus was sounded for depth of 8 cm. A pipelle was introduced to into the uterus, suction created,  and an endometrial sample was obtained. All equipment was removed and accounted for.  The patient tolerated the procedure well.    Patient given post procedure instructions. The patient will return in 2 weeks for results.    Assessment & Plan:  thicked endometrium , DUB, possible   Patient given post procedure instructions. The patient will return in 2 weeks for results.  Adam PhenixJames G Arnold, MD 07/08/2013

## 2013-07-08 NOTE — Patient Instructions (Signed)
Endometrial Biopsy Endometrial biopsy is a procedure in which a tissue sample is taken from inside the uterus. The tissue sample is then looked at under a microscope to see if the tissue is normal or abnormal. The endometrium is the lining of the uterus. This procedure helps determine where you are in your menstrual cycle and how hormone levels are affecting the lining of the uterus. This procedure may also be used to evaluate uterine bleeding or to diagnose endometrial cancer, tuberculosis, polyps, or inflammatory conditions.  LET YOUR HEALTH CARE PROVIDER KNOW ABOUT:  Any allergies you have.  All medicines you are taking, including vitamins, herbs, eye drops, creams, and over-the-counter medicines.  Previous problems you or members of your family have had with the use of anesthetics.  Any blood disorders you have.  Previous surgeries you have had.  Medical conditions you have.  Possibility of pregnancy. RISKS AND COMPLICATIONS Generally, this is a safe procedure. However, as with any procedure, complications can occur. Possible complications include:  Bleeding.  Pelvic infection.  Puncture of the uterine wall with the biopsy device (rare). BEFORE THE PROCEDURE   Keep a record of your menstrual cycles as directed by your health care provider. You may need to schedule your procedure for a specific time in your cycle.  You may want to bring a sanitary pad to wear home after the procedure.  Arrange for someone to drive you home after the procedure if you will be given a medicine to help you relax (sedative). PROCEDURE   You may be given a sedative to relax you.  You will lie on an exam table with your feet and legs supported as in a pelvic exam.  Your health care provider will insert an instrument (speculum) into your vagina to see your cervix.  Your cervix will be cleansed with an antiseptic solution. A medicine (local anesthetic) will be used to numb the cervix.  A forceps  instrument (tenaculum) will be used to hold your cervix steady for the biopsy.  A thin, rodlike instrument (uterine sound) will be inserted through your cervix to determine the length of your uterus and the location where the biopsy sample will be removed.  A thin, flexible tube (catheter) will be inserted through your cervix and into the uterus. The catheter is used to collect the biopsy sample from your endometrial tissue.  The catheter and speculum will then be removed, and the tissue sample will be sent to a lab for examination. AFTER THE PROCEDURE  You will rest in a recovery area until you are ready to go home.  You may have mild cramping and a small amount of vaginal bleeding for a few days after the procedure. This is normal.  Make sure you find out how to get your test results. Document Released: 10/12/2004 Document Revised: 02/11/2013 Document Reviewed: 11/26/2012 [employer] Patient Information 2014 [employer].  

## 2013-07-09 ENCOUNTER — Encounter: Payer: Self-pay | Admitting: *Deleted

## 2013-07-09 LAB — FOLLICLE STIMULATING HORMONE: FSH: 5.8 m[IU]/mL

## 2013-07-14 ENCOUNTER — Encounter: Payer: Self-pay | Admitting: Internal Medicine

## 2013-07-14 ENCOUNTER — Ambulatory Visit: Payer: No Typology Code available for payment source | Attending: Internal Medicine | Admitting: Internal Medicine

## 2013-07-14 VITALS — BP 131/86 | HR 86 | Temp 98.4°F | Resp 16 | Ht 68.0 in | Wt 326.0 lb

## 2013-07-14 DIAGNOSIS — E669 Obesity, unspecified: Secondary | ICD-10-CM

## 2013-07-14 DIAGNOSIS — F32A Depression, unspecified: Secondary | ICD-10-CM

## 2013-07-14 DIAGNOSIS — I10 Essential (primary) hypertension: Secondary | ICD-10-CM | POA: Insufficient documentation

## 2013-07-14 DIAGNOSIS — R234 Changes in skin texture: Secondary | ICD-10-CM

## 2013-07-14 DIAGNOSIS — IMO0002 Reserved for concepts with insufficient information to code with codable children: Secondary | ICD-10-CM

## 2013-07-14 DIAGNOSIS — F3289 Other specified depressive episodes: Secondary | ICD-10-CM

## 2013-07-14 DIAGNOSIS — E119 Type 2 diabetes mellitus without complications: Secondary | ICD-10-CM | POA: Insufficient documentation

## 2013-07-14 DIAGNOSIS — M1712 Unilateral primary osteoarthritis, left knee: Secondary | ICD-10-CM

## 2013-07-14 DIAGNOSIS — M1710 Unilateral primary osteoarthritis, unspecified knee: Secondary | ICD-10-CM | POA: Insufficient documentation

## 2013-07-14 DIAGNOSIS — F329 Major depressive disorder, single episode, unspecified: Secondary | ICD-10-CM

## 2013-07-14 LAB — RHEUMATOID FACTOR: Rhuematoid fact SerPl-aCnc: <10 IU/mL (ref <=14)

## 2013-07-14 LAB — GLUCOSE, POCT (MANUAL RESULT ENTRY): POC Glucose: 126 mg/dl — AB (ref 70–99)

## 2013-07-14 NOTE — Progress Notes (Signed)
Patient ID: Beth [name], female   DOB: [birth year], 48 y.o.   MRN: [id number] Patient Demographics  Beth [name], is a 47 y.o. female  ZDG:[id number]  [id number]  DOB - [birth year]  Chief Complaint  Patient presents with  . Follow-up        Subjective:   Beth [name] is a 48 y.o. female here today for a follow up visit. Patient has history of diabetes mellitus, hypertension, moderate to severe bilateral knee osteoarthritis and hyperlipidemia. She follows sport medicine for osteoarthritis when she gets intra-articular injections. She describes a chronic skin changes that look like brought injury all over her body which has been going on for a while, she wonders if she can see a dermatologist but this has not been worked up. The skin changes also itch. Her blood pressure and blood sugar under control with current regimen, she does not need refill of her medications today. She brought a form for disability to be filled. She denies any suicidal ideation or thoughts. She is a former smoker, drinks alcohol occasionally. Patient has No headache, No chest pain, No abdominal pain - No Nausea, No new weakness tingling or numbness, No Cough - SOB.  ALLERGIES: No Known Allergies  PAST MEDICAL HISTORY: Past Medical History  Diagnosis Date  . Diabetes mellitus   . Hypertension   . Asthma   . Arthritis   . Coronary artery disease   . Hyperlipidemia   . Phlebitis   . Asthmatic bronchitis , chronic   . Shortness of breath 05/23/11    "exertion; laying down"  . Depression   . Anxiety     MEDICATIONS AT HOME: Prior to Admission medications   Medication Sig Start Date End Date Taking? Authorizing Provider  albuterol (PROVENTIL HFA;VENTOLIN HFA) 108 (90 BASE) MCG/ACT inhaler Inhale 2 puffs into the lungs every 4 (four) hours as needed for wheezing or shortness of breath. For shortness of breath 05/26/13  Yes Angelica Chessman, MD  ARIPiprazole (ABILIFY) 10 MG tablet Take 1 tablet (10 mg  total) by mouth daily. 05/26/13  Yes Angelica Chessman, MD  atenolol (TENORMIN) 25 MG tablet Take 1 tablet (25 mg total) by mouth daily. 04/30/13  Yes Robbie Lis, MD  atorvastatin (LIPITOR) 10 MG tablet Take 1 tablet (10 mg total) by mouth daily. 04/30/13  Yes Robbie Lis, MD  DULoxetine (CYMBALTA) 60 MG capsule Take 1 capsule (60 mg total) by mouth daily. 05/26/13  Yes Angelica Chessman, MD  Fluticasone-Salmeterol (ADVAIR) 250-50 MCG/DOSE AEPB Inhale 1 puff into the lungs 2 (two) times daily as needed. For shortness of breath 05/26/13  Yes Angelica Chessman, MD  gabapentin (NEURONTIN) 600 MG tablet Take 1 tablet (600 mg total) by mouth 3 (three) times daily. 04/30/13  Yes Robbie Lis, MD  glucose monitoring kit (FREESTYLE) monitoring kit 1 each by Does not apply route 4 (four) times daily - after meals and at bedtime. 1 month Diabetic Testing Supplies for QAC-QHS accuchecks. 08/28/12  Yes Thurnell Lose, MD  hydrochlorothiazide (HYDRODIURIL) 12.5 MG tablet Take 2 tablets (25 mg total) by mouth daily. 04/30/13  Yes Robbie Lis, MD  loratadine (CLARITIN) 10 MG tablet Take 1 tablet (10 mg total) by mouth daily. 04/30/13  Yes Robbie Lis, MD  meloxicam (MOBIC) 15 MG tablet Take 1 tablet (15 mg total) by mouth daily as needed for pain. Take with food 04/30/13  Yes Robbie Lis, MD  metFORMIN (GLUCOPHAGE) 500 MG tablet Take 1 tablet (500 mg total)  by mouth 2 (two) times daily with a meal. 04/30/13  Yes Robbie Lis, MD  pantoprazole (PROTONIX) 40 MG tablet Take 1 tablet (40 mg total) by mouth daily. 04/30/13  Yes Robbie Lis, MD  sertraline (ZOLOFT) 100 MG tablet Take 1 tablet (100 mg total) by mouth daily. 04/30/13  Yes Robbie Lis, MD  ergocalciferol (DRISDOL) 50000 UNITS capsule Take 1 capsule (50,000 Units total) by mouth once a week. 12/19/12   Clanford Marisa Hua, MD     Objective:   Filed Vitals:   07/14/13 1132 07/14/13 1135  BP:  131/86  Pulse:  86  Temp:  98.4 F (36.9 C)  TempSrc:   Oral  Resp:  16  Height: 5' 8"  (1.727 m)   Weight: 326 lb (147.873 kg)   SpO2:  96%    Exam General appearance : Awake, alert, not in any distress. Speech Clear. Not toxic looking HEENT: Atraumatic and Normocephalic, pupils equally reactive to light and accomodation Neck: supple, no JVD. No cervical lymphadenopathy.  Chest:Good air entry bilaterally, no added sounds  CVS: S1 S2 regular, no murmurs.  Abdomen: Bowel sounds present, Non tender and not distended with no gaurding, rigidity or rebound. Extremities: B/L Lower Ext shows no edema, both legs are warm to touch Neurology: Awake alert, and oriented X 3, CN II-XII intact, Non focal Skin: Multiple areas of burn-like skin changes, patient denies ever being burnt.     Data Review   CBC No results found for this basename: WBC, HGB, HCT, PLT, MCV, MCH, MCHC, RDW, NEUTRABS, LYMPHSABS, MONOABS, EOSABS, BASOSABS, BANDABS, BANDSABD,  in the last 168 hours  Chemistries   No results found for this basename: NA, K, CL, CO2, GLUCOSE, BUN, CREATININE, GFRCGP, CALCIUM, MG, AST, ALT, ALKPHOS, BILITOT,  in the last 168 hours ------------------------------------------------------------------------------------------------------------------ No results found for this basename: HGBA1C,  in the last 72 hours ------------------------------------------------------------------------------------------------------------------ No results found for this basename: CHOL, HDL, LDLCALC, TRIG, CHOLHDL, LDLDIRECT,  in the last 72 hours ------------------------------------------------------------------------------------------------------------------ No results found for this basename: TSH, T4TOTAL, FREET3, T3FREE, THYROIDAB,  in the last 72 hours ------------------------------------------------------------------------------------------------------------------ No results found for this basename: VITAMINB12, FOLATE, FERRITIN, TIBC, IRON, RETICCTPCT,  in the last 72  hours  Coagulation profile  No results found for this basename: INR, PROTIME,  in the last 168 hours    Assessment & Plan   1. Diabetes  - Glucose (CBG) is 124 Last hemoglobin A1c 5.6%  2. Essential hypertension, benign Continue current regimen  3. DM type 2 (diabetes mellitus, type 2) Continue current regimen  4. Obesity, unspecified Patient counseled about nutrition and exercise as tolerated  5. Degenerative joint disease of knee, left Patient encouraged to follow with orthopedic surgery and sports medicine  6. Depression Patient was counseled. Denies any suicidal ideation or thoughts  7. Changes in skin texture  - ANA - Rheumatoid factor - Anti-DNA antibody, double-stranded - Jo-1 antibody-IgG - Anti-Smith antibody - Anti-Scleroderma Antibody  Follow up in 3 months or when necessary  The patient was given clear instructions to go to ER or return to medical center if symptoms don't improve, worsen or new problems develop. The patient verbalized understanding. The patient was told to call to get lab results if they haven't heard anything in the next week.    Angelica Chessman, MD, [place], [place], [place] and [place], [place]   07/14/2013, 12:16 PM

## 2013-07-14 NOTE — Progress Notes (Signed)
Pt is here following up on her HTN, diabetes and her chronic b/l knee pain.

## 2013-07-14 NOTE — Patient Instructions (Signed)
Osteoarthritis Osteoarthritis is a disease that causes soreness and swelling (inflammation) of a joint. It occurs when the cartilage at the affected joint wears down. Cartilage acts as a cushion, covering the ends of bones where they meet to form a joint. Osteoarthritis is the most common form of arthritis. It often occurs in older people. The joints affected most often by this condition include those in the:  Ends of the fingers.  Thumbs.  Neck.  Lower back.  Knees.  Hips. CAUSES  Over time, the cartilage that covers the ends of bones begins to wear away. This causes bone to rub on bone, producing pain and stiffness in the affected joints.  RISK FACTORS Certain factors can increase your chances of having osteoarthritis, including:  Older age.  Excessive body weight.  Overuse of joints. SIGNS AND SYMPTOMS   Pain, swelling, and stiffness in the joint.  Over time, the joint may lose its normal shape.  Small deposits of bone (osteophytes) may grow on the edges of the joint.  Bits of bone or cartilage can break off and float inside the joint space. This may cause more pain and damage. DIAGNOSIS  Your health care provider will do a physical exam and ask about your symptoms. Various tests may be ordered, such as:  X-rays of the affected joint.  An MRI scan.  Blood tests to rule out other types of arthritis.  Joint fluid tests. This involves using a needle to draw fluid from the joint and examining the fluid under a microscope. TREATMENT  Goals of treatment are to control pain and improve joint function. Treatment plans may include:  A prescribed exercise program that allows for rest and joint relief.  A weight control plan.  Pain relief techniques, such as:  Properly applied heat and cold.  Electric pulses delivered to nerve endings under the skin (transcutaneous electrical nerve stimulation, TENS).  Massage.  Certain nutritional supplements.  Medicines to  control pain, such as:  Acetaminophen.  Nonsteroidal anti-inflammatory drugs (NSAIDs), such as naproxen.  Narcotic or central-acting agents, such as tramadol.  Corticosteroids. These can be given orally or as an injection.  Surgery to reposition the bones and relieve pain (osteotomy) or to remove loose pieces of bone and cartilage. Joint replacement may be needed in advanced states of osteoarthritis. HOME CARE INSTRUCTIONS   Only take over-the-counter or prescription medicines as directed by your health care provider. Take all medicines exactly as instructed.  Maintain a healthy weight. Follow your health care provider's instructions for weight control. This may include dietary instructions.  Exercise as directed. Your health care provider can recommend specific types of exercise. These may include:  Strengthening exercises These are done to strengthen the muscles that support joints affected by arthritis. They can be performed with weights or with exercise bands to add resistance.  Aerobic activities These are exercises, such as brisk walking or low-impact aerobics, that get your heart pumping.  Range-of-motion activities These keep your joints limber.  Balance and agility exercises These help you maintain daily living skills.  Rest your affected joints as directed by your health care provider.  Follow up with your health care provider as directed. SEEK MEDICAL CARE IF:   Your skin turns red.  You develop a rash in addition to your joint pain.  You have worsening joint pain. SEEK IMMEDIATE MEDICAL CARE IF:  You have a significant loss of weight or appetite.  You have a fever along with joint or muscle aches.  You have   night sweats. FOR MORE INFORMATION  National Institute of Arthritis and Musculoskeletal and Skin Diseases: www.niams.nih.gov National Institute on Aging: www.nia.nih.gov American College of Rheumatology: www.rheumatology.org Document Released: 06/11/2005  Document Revised: 04/01/2013 Document Reviewed: 02/16/2013 [employer] Patient Information 2014 [employer].  

## 2013-07-15 ENCOUNTER — Telehealth: Payer: Self-pay | Admitting: *Deleted

## 2013-07-15 LAB — ANTI-SMITH ANTIBODY: ENA SM AB SER-ACNC: NEGATIVE

## 2013-07-15 LAB — ANA: Anti Nuclear Antibody(ANA): NEGATIVE

## 2013-07-15 LAB — JO-1 ANTIBODY-IGG: Jo-1 Antibody, IgG: <1

## 2013-07-15 LAB — ANTI-SCLERODERMA ANTIBODY: SCLERODERMA (SCL-70) (ENA) ANTIBODY, IGG: NEGATIVE

## 2013-07-15 LAB — ANTI-DNA ANTIBODY, DOUBLE-STRANDED: ds DNA Ab: <1 IU/mL

## 2013-07-15 NOTE — Telephone Encounter (Signed)
Message copied by Raynelle CharyWINFREE, Nayson Traweek R on Wed Jul 15, 2013 11:28 AM ------      Message from: Jeanann LewandowskyJEGEDE, OLUGBEMIGA E      Created: Wed Jul 15, 2013 11:26 AM       Please call to inform patient that the tests for autoimmune disease as a cause for her skin condition all came back negative. We will refer her to dermatologist for further workup and treatment ------

## 2013-07-15 NOTE — Telephone Encounter (Signed)
I spoke to the pt and informed her that the autoimmune disease test were all negative and that we are referring her to dermatologist.

## 2013-07-15 NOTE — Addendum Note (Signed)
Addended by: Jeanann LewandowskyJEGEDE, Kushi Kun E on: 07/15/2013 11:27 AM   Modules accepted: Orders

## 2013-07-22 ENCOUNTER — Encounter: Payer: Self-pay | Admitting: Obstetrics & Gynecology

## 2013-07-22 ENCOUNTER — Ambulatory Visit (INDEPENDENT_AMBULATORY_CARE_PROVIDER_SITE_OTHER): Payer: No Typology Code available for payment source | Admitting: Obstetrics & Gynecology

## 2013-07-22 VITALS — BP 130/80 | HR 108 | Temp 96.5°F | Ht 68.0 in | Wt 323.9 lb

## 2013-07-22 DIAGNOSIS — N926 Irregular menstruation, unspecified: Secondary | ICD-10-CM

## 2013-07-22 DIAGNOSIS — N939 Abnormal uterine and vaginal bleeding, unspecified: Secondary | ICD-10-CM

## 2013-07-22 NOTE — Progress Notes (Signed)
Patient ID: Beth [name], female   DOB: 11/22/1965, 48 y.o.   MRN: [id number] Biopsy showed complex hyperplasia and focal atypia. Schedule hysteroscopy and D&C, procedure and risks explained and questions answered.  Adam PhenixJames G Aliza Moret, MD 07/22/2013

## 2013-07-22 NOTE — Patient Instructions (Signed)
Hysteroscopy °Hysteroscopy is a procedure used for looking inside the womb (uterus). It may be done for various reasons, including: °· To evaluate abnormal bleeding, fibroid (benign, noncancerous) tumors, polyps, scar tissue (adhesions), and possibly cancer of the uterus. °· To look for lumps (tumors) and other uterine growths. °· To look for causes of why a woman cannot get pregnant (infertility), causes of recurrent loss of pregnancy (miscarriages), or a lost intrauterine device (IUD). °· To perform a sterilization by blocking the fallopian tubes from inside the uterus. °In this procedure, a thin, flexible tube with a tiny light and camera on the end of it (hysteroscope) is used to look inside the uterus. A hysteroscopy should be done right after a menstrual period to be sure you are not pregnant. °LET YOUR HEALTH CARE PROVIDER KNOW ABOUT:  °· Any allergies you have. °· All medicines you are taking, including vitamins, herbs, eye drops, creams, and over-the-counter medicines. °· Previous problems you or members of your family have had with the use of anesthetics. °· Any blood disorders you have. °· Previous surgeries you have had. °· Medical conditions you have. °RISKS AND COMPLICATIONS  °Generally, this is a safe procedure. However, as with any procedure, complications can occur. Possible complications include: °· Putting a hole in the uterus. °· Excessive bleeding. °· Infection. °· Damage to the cervix. °· Injury to other organs. °· Allergic reaction to medicines. °· Too much fluid used in the uterus for the procedure. °BEFORE THE PROCEDURE  °· Ask your health care provider about changing or stopping any regular medicines. °· Do not take aspirin or blood thinners for 1 week before the procedure, or as directed by your health care provider. These can cause bleeding. °· If you smoke, do not smoke for 2 weeks before the procedure. °· In some cases, a medicine is placed in the cervix the day before the procedure.  This medicine makes the cervix have a larger opening (dilate). This makes it easier for the instrument to be inserted into the uterus during the procedure. °· Do not eat or drink anything for at least 8 hours before the surgery. °· Arrange for someone to take you home after the procedure. °PROCEDURE  °· You may be given a medicine to relax you (sedative). You may also be given one of the following: °· A medicine that numbs the area around the cervix (local anesthetic). °· A medicine that makes you sleep through the procedure (general anesthetic). °· The hysteroscope is inserted through the vagina into the uterus. The camera on the hysteroscope sends a picture to a TV screen. This gives the surgeon a good view inside the uterus. °· During the procedure, air or a liquid is put into the uterus, which allows the surgeon to see better. °· Sometimes, tissue is gently scraped from inside the uterus. These tissue samples are sent to a lab for testing. °AFTER THE PROCEDURE  °· If you had a general anesthetic, you may be groggy for a couple hours after the procedure. °· If you had a local anesthetic, you will be able to go home as soon as you are stable and feel ready. °· You may have some cramping. This normally lasts for a couple days. °· You may have bleeding, which varies from light spotting for a few days to menstrual-like bleeding for 3 7 days. This is normal. °· If your test results are not back during the visit, make an appointment with your health care provider to find out   the results. °Document Released: 09/17/2000 Document Revised: 04/01/2013 Document Reviewed: 01/08/2013 °ExitCare® Patient Information ©2014 [employer]. ° °

## 2013-07-24 ENCOUNTER — Other Ambulatory Visit: Payer: Self-pay | Admitting: Emergency Medicine

## 2013-07-24 MED ORDER — FLUTICASONE-SALMETEROL 250-50 MCG/DOSE IN AEPB: 1.0000 | INHALATION_SPRAY | Freq: Two times a day (BID) | RESPIRATORY_TRACT | Status: DC

## 2013-07-28 ENCOUNTER — Encounter (HOSPITAL_COMMUNITY): Payer: Self-pay | Admitting: Pharmacist

## 2013-07-30 ENCOUNTER — Encounter (HOSPITAL_COMMUNITY): Payer: Self-pay

## 2013-07-30 ENCOUNTER — Other Ambulatory Visit: Payer: Self-pay

## 2013-07-30 ENCOUNTER — Encounter (HOSPITAL_COMMUNITY)
Admission: RE | Admit: 2013-07-30 | Discharge: 2013-07-30 | Disposition: A | Discharge status: Home / Self Care | Payer: No Typology Code available for payment source | Source: Ambulatory Visit | Attending: Obstetrics & Gynecology | Admitting: Obstetrics & Gynecology

## 2013-07-30 DIAGNOSIS — Z01810 Encounter for preprocedural cardiovascular examination: Secondary | ICD-10-CM | POA: Insufficient documentation

## 2013-07-30 DIAGNOSIS — Z01812 Encounter for preprocedural laboratory examination: Secondary | ICD-10-CM | POA: Insufficient documentation

## 2013-07-30 LAB — CBC
HEMATOCRIT: 40.4 % (ref 36.0–46.0)
Hemoglobin: 13.5 g/dL (ref 12.0–15.0)
MCH: 28.8 pg (ref 26.0–34.0)
MCHC: 33.4 g/dL (ref 30.0–36.0)
MCV: 86.1 fL (ref 78.0–100.0)
Platelets: 309 10*3/uL (ref 150–400)
RBC: 4.69 MIL/uL (ref 3.87–5.11)
RDW: 14.5 % (ref 11.5–15.5)
WBC: 11.2 10*3/uL — AB (ref 4.0–10.5)

## 2013-07-30 LAB — BASIC METABOLIC PANEL
BUN: 11 mg/dL (ref 6–23)
CHLORIDE: 100 meq/L (ref 96–112)
CO2: 30 mEq/L (ref 19–32)
Calcium: 9.3 mg/dL (ref 8.4–10.5)
Creatinine, Ser: 0.82 mg/dL (ref 0.50–1.10)
GFR, EST NON AFRICAN AMERICAN: 83 mL/min — AB (ref >90)
Glucose, Bld: 145 mg/dL — ABNORMAL HIGH (ref 70–99)
POTASSIUM: 3.4 meq/L — AB (ref 3.7–5.3)
SODIUM: 143 meq/L (ref 137–147)

## 2013-07-30 LAB — SURGICAL PCR SCREEN
MRSA, PCR: NEGATIVE
Staphylococcus aureus: POSITIVE — AB

## 2013-07-30 NOTE — Patient Instructions (Addendum)
[address] Yehuda Buddamela M [place]  07/30/2013   Your procedure is scheduled on:  08/04/13  [facility] through the [facility] Entrance of [facility] at 12 Noon   Pick up the phone at the desk and dial [phone].   Call this number if you have problems the morning of surgery: [phone]   Remember:   Do not eat food:After Midnight.  Do not drink clear liquids: 4 Hours before arrival.  Take these medicines the morning of surgery with A SIP OF WATER: Blood pressure medication, Protonix and bring inhaler.   Do not wear jewelry, make-up or nail polish.  Do not wear lotions, powders, or perfumes. You may wear deodorant.  Do not shave 48 hours prior to surgery.  Do not bring valuables to the hospital.  [facility] is not   responsible for any belongings or valuables brought to the hospital.  Contacts, dentures or bridgework may not be worn into surgery.  Leave suitcase in the car. After surgery it may be brought to your room.  For patients admitted to the hospital, checkout time is 11:00 AM the day of              discharge.   Patients discharged the day of surgery will not be allowed to drive             home.  Name and phone number of your driver: sister  Erlinda HongBernette Chauncey  Special Instructions:     Please read over the following fact sheets that you were given:   Surgical Site Infection Prevention

## 2013-07-30 NOTE — Pre-Procedure Instructions (Signed)
EKG of today (07/30/13) compared to previous EKGs in the past via Dr Rodman Pickleassidy. Order given for EKG to be viewed by PCP and pt cleared for surgery. Appt made for patient for [facility]/31/13 15[facility]15 [facility]. Pt aware. EKG faxed to PCP.

## 2013-07-31 ENCOUNTER — Ambulatory Visit: Payer: No Typology Code available for payment source | Attending: Internal Medicine | Admitting: Internal Medicine

## 2013-07-31 ENCOUNTER — Encounter: Payer: Self-pay | Admitting: Internal Medicine

## 2013-07-31 VITALS — BP 116/78 | HR 97 | Temp 98.2°F | Resp 16 | Ht 68.0 in | Wt 321.0 lb

## 2013-07-31 DIAGNOSIS — I10 Essential (primary) hypertension: Secondary | ICD-10-CM | POA: Insufficient documentation

## 2013-07-31 DIAGNOSIS — R0989 Other specified symptoms and signs involving the circulatory and respiratory systems: Principal | ICD-10-CM | POA: Insufficient documentation

## 2013-07-31 DIAGNOSIS — Z87891 Personal history of nicotine dependence: Secondary | ICD-10-CM | POA: Insufficient documentation

## 2013-07-31 DIAGNOSIS — F329 Major depressive disorder, single episode, unspecified: Secondary | ICD-10-CM | POA: Insufficient documentation

## 2013-07-31 DIAGNOSIS — Z79899 Other long term (current) drug therapy: Secondary | ICD-10-CM | POA: Insufficient documentation

## 2013-07-31 DIAGNOSIS — E785 Hyperlipidemia, unspecified: Secondary | ICD-10-CM | POA: Insufficient documentation

## 2013-07-31 DIAGNOSIS — E119 Type 2 diabetes mellitus without complications: Secondary | ICD-10-CM

## 2013-07-31 DIAGNOSIS — IMO0001 Reserved for inherently not codable concepts without codable children: Secondary | ICD-10-CM | POA: Insufficient documentation

## 2013-07-31 DIAGNOSIS — F411 Generalized anxiety disorder: Secondary | ICD-10-CM | POA: Insufficient documentation

## 2013-07-31 DIAGNOSIS — J45909 Unspecified asthma, uncomplicated: Secondary | ICD-10-CM | POA: Insufficient documentation

## 2013-07-31 DIAGNOSIS — E1165 Type 2 diabetes mellitus with hyperglycemia: Secondary | ICD-10-CM

## 2013-07-31 DIAGNOSIS — R0609 Other forms of dyspnea: Secondary | ICD-10-CM | POA: Insufficient documentation

## 2013-07-31 DIAGNOSIS — F3289 Other specified depressive episodes: Secondary | ICD-10-CM | POA: Insufficient documentation

## 2013-07-31 DIAGNOSIS — I2510 Atherosclerotic heart disease of native coronary artery without angina pectoris: Secondary | ICD-10-CM | POA: Insufficient documentation

## 2013-07-31 LAB — LIPID PANEL
CHOL/HDL RATIO: 5.8 ratio
CHOLESTEROL: 184 mg/dL (ref 0–200)
HDL: 32 mg/dL — ABNORMAL LOW (ref >39)
LDL Cholesterol: 128 mg/dL — ABNORMAL HIGH (ref 0–99)
Triglycerides: 120 mg/dL (ref <150)
VLDL: 24 mg/dL (ref 0–40)

## 2013-07-31 LAB — POCT GLYCOSYLATED HEMOGLOBIN (HGB A1C): HEMOGLOBIN A1C: 5.9

## 2013-07-31 MED ORDER — ALBUTEROL SULFATE HFA 108 (90 BASE) MCG/ACT IN AERS: 2.0000 | INHALATION_SPRAY | RESPIRATORY_TRACT | Status: DC | PRN

## 2013-07-31 MED ORDER — POTASSIUM CHLORIDE CRYS ER 20 MEQ PO TBCR: 20.0000 meq | EXTENDED_RELEASE_TABLET | Freq: Two times a day (BID) | ORAL | Status: DC

## 2013-07-31 NOTE — Progress Notes (Signed)
Pt is here following up on her chronic bl knee and leg pain.

## 2013-07-31 NOTE — Progress Notes (Signed)
Patient ID: Beth [name], female   DOB: Mar 16, 1966, 48 y.o.   MRN: [id number]   CC:  HPI: 48 year old female with history of diabetes, hypertension and presents today for medical clearance the patient complains of intermittent dyspnea and exertion, no dependent edema, no chest pain. Patient has a remote history of asthma and is not using her inhaler excessively. She had complex hyperplasia and atypia on her endometrial biopsy and is scheduled for a hysteroscopy and D&C. She denies any history of irregular heartbeat, orthopnea paroxysmal nocturnal dyspnea, dependent edema    No Known Allergies Past Medical History  Diagnosis Date  . Diabetes mellitus   . Hypertension   . Asthma   . Arthritis   . Coronary artery disease   . Hyperlipidemia   . Phlebitis   . Asthmatic bronchitis , chronic   . Shortness of breath 05/23/11    "exertion; laying down"  . Depression   . Anxiety    Current Outpatient Prescriptions on File Prior to Visit  Medication Sig Dispense Refill  . ARIPiprazole (ABILIFY) 10 MG tablet Take 1 tablet (10 mg total) by mouth daily.  90 tablet  1 year  . atorvastatin (LIPITOR) 10 MG tablet Take 1 tablet (10 mg total) by mouth daily.  30 tablet  5  . DULoxetine (CYMBALTA) 60 MG capsule Take 1 capsule (60 mg total) by mouth daily.  90 capsule  1 year  . Fluticasone-Salmeterol (ADVAIR) 250-50 MCG/DOSE AEPB Inhale 1 puff into the lungs every 12 (twelve) hours. For shortness of breath  60 each  1 year  . gabapentin (NEURONTIN) 600 MG tablet Take 1 tablet (600 mg total) by mouth 3 (three) times daily.  90 tablet  5  . glucose monitoring kit (FREESTYLE) monitoring kit 1 each by Does not apply route 4 (four) times daily - after meals and at bedtime. 1 month Diabetic Testing Supplies for QAC-QHS accuchecks.  1 each  1  . hydrochlorothiazide (HYDRODIURIL) 12.5 MG tablet Take 2 tablets (25 mg total) by mouth daily.  30 tablet  5  . loratadine (CLARITIN) 10 MG tablet Take 1 tablet (10  mg total) by mouth daily.  30 tablet  5  . meloxicam (MOBIC) 15 MG tablet Take 1 tablet (15 mg total) by mouth daily as needed for pain. Take with food  30 tablet  5  . metFORMIN (GLUCOPHAGE) 500 MG tablet Take 1 tablet (500 mg total) by mouth 2 (two) times daily with a meal.  60 tablet  5  . pantoprazole (PROTONIX) 40 MG tablet Take 1 tablet (40 mg total) by mouth daily.  30 tablet  5  . sertraline (ZOLOFT) 100 MG tablet Take 1 tablet (100 mg total) by mouth daily.  30 tablet  5  . atenolol (TENORMIN) 25 MG tablet Take 1 tablet (25 mg total) by mouth daily.  30 tablet  5  . [DISCONTINUED] buPROPion (WELLBUTRIN XL) 150 MG 24 hr tablet Take 150 mg by mouth daily.        . [DISCONTINUED] metoprolol succinate (TOPROL-XL) 100 MG 24 hr tablet Take 1 tablet (100 mg total) by mouth daily.  30 tablet  2  . [DISCONTINUED] pravastatin (PRAVACHOL) 40 MG tablet Take 40 mg by mouth daily.        . [DISCONTINUED] risperiDONE (RISPERDAL) 2 MG tablet Take 2 mg by mouth at bedtime.         No current facility-administered medications on file prior to visit.   Family History  Problem Relation  Age of Onset  . Depression Mother   . Hypertension Mother   . Diabetes Mother   . Hypertension Father   . Hyperlipidemia Father   . Depression Sister    History   Social History  . Marital Status: Divorced    Spouse Name: N/A    Number of Children: N/A  . Years of Education: N/A   Occupational History  . Not on file.   Social History Main Topics  . Smoking status: Former Smoker -- 0.25 packs/day for 19 years    Types: Cigarettes  . Smokeless tobacco: Never Used  . Alcohol Use: Yes     Comment: "quit alcohol ~ 2005; only then drank occasionl wine cooler"  . Drug Use: Yes    Special: Cocaine, Marijuana, "Crack" cocaine     Comment: last used 10 yrs ago  . Sexual Activity: No     Comment: "quit smoking cigarettes ~2005"   Other Topics Concern  . Not on file   Social History Narrative  . No narrative  on file    Review of Systems  Constitutional: Negative for fever, chills, diaphoresis, activity change, appetite change and fatigue.  HENT: Negative for ear pain, nosebleeds, congestion, facial swelling, rhinorrhea, neck pain, neck stiffness and ear discharge.   Eyes: Negative for pain, discharge, redness, itching and visual disturbance.  Respiratory: As in history of present illness  Cardiovascular: Negative for chest pain, palpitations and leg swelling.  Gastrointestinal: Negative for abdominal distention.  Genitourinary: Negative for dysuria, urgency, frequency, hematuria, flank pain, decreased urine volume, difficulty urinating and dyspareunia.  Musculoskeletal: Negative for back pain, joint swelling, arthralgias and gait problem.  Neurological: Negative for dizziness, tremors, seizures, syncope, facial asymmetry, speech difficulty, weakness, light-headedness, numbness and headaches.  Hematological: Negative for adenopathy. Does not bruise/bleed easily.  Psychiatric/Behavioral: Negative for hallucinations, behavioral problems, confusion, dysphoric mood, decreased concentration and agitation.    Objective:   Filed Vitals:   07/31/13 1534  BP: 116/78  Pulse: 97  Temp: 98.2 F (36.8 C)  Resp: 16    Physical Exam  Constitutional: Appears well-developed and well-nourished. No distress.  HENT: Normocephalic. External right and left ear normal. Oropharynx is clear and moist.  Eyes: Conjunctivae and EOM are normal. PERRLA, no scleral icterus.  Neck: Normal ROM. Neck supple. No JVD. No tracheal deviation. No thyromegaly.  CVS: RRR, S1/S2 +, no murmurs, no gallops, no carotid bruit.  Pulmonary: Effort and breath sounds normal, no stridor, rhonchi, wheezes, rales.  Abdominal: Soft. BS +,  no distension, tenderness, rebound or guarding.  Musculoskeletal: Normal range of motion. No edema and no tenderness.  Lymphadenopathy: No lymphadenopathy noted, cervical, inguinal. Neuro: Alert.  Normal reflexes, muscle tone coordination. No cranial nerve deficit. Skin: Skin is warm and dry. No rash noted. Not diaphoretic. No erythema. No pallor.  Psychiatric: Normal mood and affect. Behavior, judgment, thought content normal.   Lab Results  Component Value Date   WBC 11.2* 07/30/2013   HGB 13.5 07/30/2013   HCT 40.4 07/30/2013   MCV 86.1 07/30/2013   PLT 309 07/30/2013   Lab Results  Component Value Date   CREATININE 0.82 07/30/2013   BUN 11 07/30/2013   NA 143 07/30/2013   K 3.4* 07/30/2013   CL 100 07/30/2013   CO2 30 07/30/2013    Lab Results  Component Value Date   HGBA1C 5.6 04/30/2013   Lipid Panel     Component Value Date/Time   CHOL 273* 04/30/2013 1241   TRIG 151* 04/30/2013  1241   HDL 38* 04/30/2013 1241   CHOLHDL 7.2 04/30/2013 1241   VLDL 30 04/30/2013 1241   LDLCALC 205* 04/30/2013 1241       Assessment and plan:   Patient Active Problem List   Diagnosis Date Noted  . Changes in skin texture 07/14/2013  . Abnormal uterine bleeding 06/08/2013  . Preventative health care 04/30/2013  . Degenerative joint disease of knee, left 09/19/2012  . Chronic knee pain 09/19/2012  . DM type 2 (diabetes mellitus, type 2) 05/25/2011  . Depression 05/23/2011  . Neuropathy 05/23/2011  . HYPERLIPIDEMIA 08/22/2006  . OBESITY, NOS 08/22/2006  . HYPERTENSION, BENIGN SYSTEMIC 08/22/2006       Diabetes type 2 Uncontrolled, A1c of 5.6 Continue metformin   Medical clearance Labs on 2/5 showed potassium of 3.4 We'll start the patient on potassium supplementation She does not smoke or drink Does not have any significant cardiopulmonary symptoms No obvious apparent contraindication to surgery at this point   Hypertension history of asthma Obtain a chest x-ray In future the patient would need a 2-D echo for evaluation of her chronic shortness of breath However this 2-D echo can be done after the surgery Followup in 2 months    The patient was given clear instructions to go to ER  or return to medical center if symptoms don't improve, worsen or new problems develop. The patient verbalized understanding. The patient was told to call to get any lab results if not heard anything in the next week.

## 2013-08-01 ENCOUNTER — Other Ambulatory Visit: Payer: Self-pay | Admitting: Obstetrics & Gynecology

## 2013-08-03 ENCOUNTER — Telehealth: Payer: Self-pay | Admitting: *Deleted

## 2013-08-03 ENCOUNTER — Ambulatory Visit: Payer: Self-pay | Admitting: Internal Medicine

## 2013-08-03 NOTE — Telephone Encounter (Signed)
Message copied by Audi Conover, [place] on Mon Aug 03, 2013  2:13 PM ------      Message from: Susie CassetteABROL MD, [facility]      Created: Mon Aug 03, 2013 11:36 AM       Notify patient of labs are normal, she is nondiabetic ------

## 2013-08-04 ENCOUNTER — Encounter (HOSPITAL_COMMUNITY)
Admission: RE | Disposition: A | Discharge status: Home / Self Care | Payer: No Typology Code available for payment source | Source: Ambulatory Visit | Attending: Obstetrics & Gynecology

## 2013-08-04 ENCOUNTER — Ambulatory Visit (HOSPITAL_COMMUNITY): Payer: No Typology Code available for payment source | Admitting: Anesthesiology

## 2013-08-04 ENCOUNTER — Ambulatory Visit (HOSPITAL_COMMUNITY)
Admission: RE | Admit: 2013-08-04 | Discharge: 2013-08-04 | Disposition: A | Discharge status: Home / Self Care | Payer: No Typology Code available for payment source | Source: Ambulatory Visit | Attending: Obstetrics & Gynecology | Admitting: Obstetrics & Gynecology

## 2013-08-04 ENCOUNTER — Encounter (HOSPITAL_COMMUNITY): Payer: No Typology Code available for payment source | Admitting: Anesthesiology

## 2013-08-04 ENCOUNTER — Encounter (HOSPITAL_COMMUNITY): Payer: Self-pay | Admitting: Anesthesiology

## 2013-08-04 DIAGNOSIS — Z87891 Personal history of nicotine dependence: Secondary | ICD-10-CM | POA: Insufficient documentation

## 2013-08-04 DIAGNOSIS — N950 Postmenopausal bleeding: Secondary | ICD-10-CM | POA: Insufficient documentation

## 2013-08-04 DIAGNOSIS — E119 Type 2 diabetes mellitus without complications: Secondary | ICD-10-CM | POA: Insufficient documentation

## 2013-08-04 DIAGNOSIS — N8502 Endometrial intraepithelial neoplasia [EIN]: Secondary | ICD-10-CM | POA: Insufficient documentation

## 2013-08-04 DIAGNOSIS — N926 Irregular menstruation, unspecified: Secondary | ICD-10-CM

## 2013-08-04 DIAGNOSIS — K219 Gastro-esophageal reflux disease without esophagitis: Secondary | ICD-10-CM | POA: Insufficient documentation

## 2013-08-04 DIAGNOSIS — N939 Abnormal uterine and vaginal bleeding, unspecified: Secondary | ICD-10-CM

## 2013-08-04 DIAGNOSIS — N840 Polyp of corpus uteri: Secondary | ICD-10-CM | POA: Insufficient documentation

## 2013-08-04 DIAGNOSIS — I10 Essential (primary) hypertension: Secondary | ICD-10-CM | POA: Insufficient documentation

## 2013-08-04 HISTORY — PX: HYSTEROSCOPY W/D&C: SHX1775

## 2013-08-04 LAB — GLUCOSE, CAPILLARY
GLUCOSE-CAPILLARY: 171 mg/dL — AB (ref 70–99)
Glucose-Capillary: 103 mg/dL — ABNORMAL HIGH (ref 70–99)

## 2013-08-04 LAB — PREGNANCY, URINE: PREG TEST UR: NEGATIVE

## 2013-08-04 SURGERY — DILATATION AND CURETTAGE /HYSTEROSCOPY
Anesthesia: General | Site: Uterus

## 2013-08-04 MED ORDER — MEPERIDINE HCL 25 MG/ML IJ SOLN: 6.2500 mg | INTRAMUSCULAR | Status: DC | PRN

## 2013-08-04 MED ORDER — BUPIVACAINE HCL 0.5 % IJ SOLN
INTRAMUSCULAR | Status: DC | PRN
  Administered 2013-08-04: 6 mL

## 2013-08-04 MED ORDER — PROPOFOL 10 MG/ML IV EMUL
INTRAVENOUS | Status: AC
  Filled 2013-08-04: qty 20

## 2013-08-04 MED ORDER — FENTANYL CITRATE 0.05 MG/ML IJ SOLN
INTRAMUSCULAR | Status: DC | PRN
  Administered 2013-08-04 (×3): 50 ug via INTRAVENOUS

## 2013-08-04 MED ORDER — LACTATED RINGERS IV SOLN: INTRAVENOUS | Status: DC

## 2013-08-04 MED ORDER — FENTANYL CITRATE 0.05 MG/ML IJ SOLN
INTRAMUSCULAR | Status: AC
  Filled 2013-08-04: qty 2

## 2013-08-04 MED ORDER — GLYCINE 1.5 % IR SOLN
Status: DC | PRN
  Administered 2013-08-04: 3000 mL

## 2013-08-04 MED ORDER — OXYCODONE-ACETAMINOPHEN 5-325 MG PO TABS: 1.0000 | ORAL_TABLET | Freq: Four times a day (QID) | ORAL | Status: DC | PRN

## 2013-08-04 MED ORDER — MIDAZOLAM HCL 2 MG/2ML IJ SOLN
INTRAMUSCULAR | Status: DC | PRN
  Administered 2013-08-04 (×2): 1 mg via INTRAVENOUS

## 2013-08-04 MED ORDER — ONDANSETRON HCL 4 MG/2ML IJ SOLN: 4.0000 mg | Freq: Once | INTRAMUSCULAR | Status: DC | PRN

## 2013-08-04 MED ORDER — BUPIVACAINE-EPINEPHRINE (PF) 0.5% -1:200000 IJ SOLN
INTRAMUSCULAR | Status: AC
  Filled 2013-08-04: qty 10

## 2013-08-04 MED ORDER — ONDANSETRON HCL 4 MG/2ML IJ SOLN
INTRAMUSCULAR | Status: AC
  Filled 2013-08-04: qty 2

## 2013-08-04 MED ORDER — ONDANSETRON HCL 4 MG/2ML IJ SOLN
INTRAMUSCULAR | Status: DC | PRN
  Administered 2013-08-04: 4 mg via INTRAVENOUS

## 2013-08-04 MED ORDER — LIDOCAINE HCL (CARDIAC) 20 MG/ML IV SOLN
INTRAVENOUS | Status: DC | PRN
  Administered 2013-08-04: 80 mg via INTRAVENOUS

## 2013-08-04 MED ORDER — BUPIVACAINE HCL (PF) 0.5 % IJ SOLN
INTRAMUSCULAR | Status: AC
  Filled 2013-08-04: qty 30

## 2013-08-04 MED ORDER — DEXAMETHASONE SODIUM PHOSPHATE 10 MG/ML IJ SOLN
INTRAMUSCULAR | Status: DC | PRN
  Administered 2013-08-04: 4 mg via INTRAVENOUS

## 2013-08-04 MED ORDER — LACTATED RINGERS IV SOLN
INTRAVENOUS | Status: DC
  Administered 2013-08-04 (×3): via INTRAVENOUS

## 2013-08-04 MED ORDER — KETOROLAC TROMETHAMINE 30 MG/ML IJ SOLN: 15.0000 mg | Freq: Once | INTRAMUSCULAR | Status: DC | PRN

## 2013-08-04 MED ORDER — LIDOCAINE HCL (CARDIAC) 20 MG/ML IV SOLN
INTRAVENOUS | Status: AC
  Filled 2013-08-04: qty 5

## 2013-08-04 MED ORDER — MIDAZOLAM HCL 2 MG/2ML IJ SOLN
INTRAMUSCULAR | Status: AC
  Filled 2013-08-04: qty 2

## 2013-08-04 MED ORDER — FENTANYL CITRATE 0.05 MG/ML IJ SOLN: 25.0000 ug | INTRAMUSCULAR | Status: DC | PRN

## 2013-08-04 MED ORDER — DEXAMETHASONE SODIUM PHOSPHATE 10 MG/ML IJ SOLN
INTRAMUSCULAR | Status: AC
  Filled 2013-08-04: qty 1

## 2013-08-04 MED ORDER — PROPOFOL 10 MG/ML IV BOLUS
INTRAVENOUS | Status: DC | PRN
  Administered 2013-08-04: 200 mg via INTRAVENOUS

## 2013-08-04 SURGICAL SUPPLY — 24 items
ABLATOR ENDOMETRIAL BIPOLAR (ABLATOR) IMPLANT
CANISTER SUCT 3000ML (MISCELLANEOUS) ×3 IMPLANT
CATH ROBINSON RED A/P 16FR (CATHETERS) ×3 IMPLANT
CLOTH BEACON ORANGE TIMEOUT ST (SAFETY) ×3 IMPLANT
CONTAINER PREFILL 10% NBF 60ML (FORM) ×6 IMPLANT
CORD ACTIVE DISPOSABLE (ELECTRODE) ×2
CORD ELECTRO ACTIVE DISP (ELECTRODE) ×1 IMPLANT
DRSG TELFA 3X8 NADH (GAUZE/BANDAGES/DRESSINGS) ×3 IMPLANT
ELECT LOOP GYNE PRO 24FR (CUTTING LOOP) ×3
ELECT REM PT RETURN 9FT ADLT (ELECTROSURGICAL) ×3
ELECTRODE LOOP GYNE PRO 24FR (CUTTING LOOP) ×1 IMPLANT
ELECTRODE REM PT RTRN 9FT ADLT (ELECTROSURGICAL) ×1 IMPLANT
GLOVE BIO SURGEON STRL SZ 6.5 (GLOVE) ×2 IMPLANT
GLOVE BIO SURGEONS STRL SZ 6.5 (GLOVE) ×1
GLOVE BIOGEL PI IND STRL 7.0 (GLOVE) ×1 IMPLANT
GLOVE BIOGEL PI INDICATOR 7.0 (GLOVE) ×2
GOWN STRL REUS W/TWL LRG LVL3 (GOWN DISPOSABLE) ×6 IMPLANT
NEEDLE SPNL 22GX3.5 QUINCKE BK (NEEDLE) ×3 IMPLANT
PACK VAGINAL MINOR WOMEN LF (CUSTOM PROCEDURE TRAY) ×3 IMPLANT
PAD OB MATERNITY 4.3X12.25 (PERSONAL CARE ITEMS) ×3 IMPLANT
SYR CONTROL 10ML LL (SYRINGE) ×3 IMPLANT
TOWEL OR 17X24 6PK STRL BLUE (TOWEL DISPOSABLE) ×6 IMPLANT
TUBING HYDROFLEX HYSTEROSCOPY (TUBING) ×3 IMPLANT
WATER STERILE IRR 1000ML POUR (IV SOLUTION) ×3 IMPLANT

## 2013-08-04 NOTE — H&P (Signed)
Beth [name] is an 48 y.o. female. G0P0000 No LMP recorded. Patient is not currently having periods (Reason: Perimenopausal). LMP 06/2012, endometrial biopsy showed atypical hyperplasia, for hysteroscopy and D&C.the procedure and the risk of anesthesia, infection, uterine damage, pain were discussed and questions were answered.  Pertinent Gynecological History: Menses: postmenopausal bleeding Bleeding: post menopausal bleeding Contraception:   DES exposure:   Blood transfusions: none Sexually transmitted diseases: no past history Previous GYN Procedures:     Last pap: normal Date: 11/2012 OB History: G0, P0   Menstrual History:  No LMP recorded. Patient is not currently having periods (Reason: Perimenopausal).    Past Medical History  Diagnosis Date  . Diabetes mellitus   . Hypertension   . Asthma   . Arthritis   . Coronary artery disease   . Hyperlipidemia   . Phlebitis   . Asthmatic bronchitis , chronic   . Shortness of breath 05/23/11    "exertion; laying down"  . Depression   . Anxiety     Past Surgical History  Procedure Laterality Date  . Hernia repair    . Umbilical hernia repair  05/2004  . Cholecystectomy    . Dilation and curettage of uterus      Family History  Problem Relation Age of Onset  . Depression Mother   . Hypertension Mother   . Diabetes Mother   . Hypertension Father   . Hyperlipidemia Father   . Depression Sister     Social History:  reports that she has quit smoking. Her smoking use included Cigarettes. She has a 4.75 pack-year smoking history. She has never used smokeless tobacco. She reports that she drinks alcohol. She reports that she uses illicit drugs (Cocaine, Marijuana, and "Crack" cocaine).  Allergies: No Known Allergies  Prescriptions prior to admission  Medication Sig Dispense Refill  . albuterol (PROVENTIL HFA;VENTOLIN HFA) 108 (90 BASE) MCG/ACT inhaler Inhale 2 puffs into the lungs every 4 (four) hours as needed for  wheezing or shortness of breath. For shortness of breath  3 Inhaler  1 year  . ARIPiprazole (ABILIFY) 10 MG tablet Take 1 tablet (10 mg total) by mouth daily.  90 tablet  1 year  . atenolol (TENORMIN) 25 MG tablet Take 1 tablet (25 mg total) by mouth daily.  30 tablet  5  . atorvastatin (LIPITOR) 10 MG tablet Take 1 tablet (10 mg total) by mouth daily.  30 tablet  5  . DULoxetine (CYMBALTA) 60 MG capsule Take 1 capsule (60 mg total) by mouth daily.  90 capsule  1 year  . Fluticasone-Salmeterol (ADVAIR) 250-50 MCG/DOSE AEPB Inhale 1 puff into the lungs every 12 (twelve) hours. For shortness of breath  60 each  1 year  . gabapentin (NEURONTIN) 600 MG tablet Take 1 tablet (600 mg total) by mouth 3 (three) times daily.  90 tablet  5  . hydrochlorothiazide (HYDRODIURIL) 12.5 MG tablet Take 2 tablets (25 mg total) by mouth daily.  30 tablet  5  . loratadine (CLARITIN) 10 MG tablet Take 1 tablet (10 mg total) by mouth daily.  30 tablet  5  . meloxicam (MOBIC) 15 MG tablet Take 1 tablet (15 mg total) by mouth daily as needed for pain. Take with food  30 tablet  5  . metFORMIN (GLUCOPHAGE) 500 MG tablet Take 1 tablet (500 mg total) by mouth 2 (two) times daily with a meal.  60 tablet  5  . pantoprazole (PROTONIX) 40 MG tablet Take 1 tablet (40 mg  total) by mouth daily.  30 tablet  5  . sertraline (ZOLOFT) 100 MG tablet Take 1 tablet (100 mg total) by mouth daily.  30 tablet  5  . glucose monitoring kit (FREESTYLE) monitoring kit 1 each by Does not apply route 4 (four) times daily - after meals and at bedtime. 1 month Diabetic Testing Supplies for QAC-QHS accuchecks.  1 each  1  . potassium chloride SA (K-DUR,KLOR-CON) 20 MEQ tablet Take 1 tablet (20 mEq total) by mouth 2 (two) times daily.  30 tablet  0    Review of Systems  Respiratory: Negative.   Cardiovascular: Negative.   Genitourinary: Negative.     Blood pressure 151/88, pulse 67, temperature 98.1 F (36.7 C), temperature source Oral, resp.  rate 16, SpO2 97.00%. Physical Exam  Vitals reviewed. Constitutional: She is oriented to person, place, and time. She appears well-developed. No distress.  Obese   Respiratory: Effort normal. No respiratory distress.  GI: Soft. She exhibits no mass. There is no tenderness.  Neurological: She is alert and oriented to person, place, and time.  Skin: Skin is warm and dry.  Psychiatric: She has a normal mood and affect. Her behavior is normal.    Results for orders placed during the hospital encounter of 08/04/13 (from the past 24 hour(s))  PREGNANCY, URINE     Status: None   Collection Time    08/04/13 12:00 PM      Result Value Range   Preg Test, Ur NEGATIVE  NEGATIVE  GLUCOSE, CAPILLARY     Status: Abnormal   Collection Time    08/04/13 12:14 PM      Result Value Range   Glucose-Capillary 171 (*) 70 - 99 mg/dL    No results found.  Assessment/Plan: Hysteroscopy, D&C today as outpatient.  Deziree Mokry 08/04/2013, 1:15 PM

## 2013-08-04 NOTE — Anesthesia Preprocedure Evaluation (Signed)
Anesthesia Evaluation  Patient identified by MRN, date of birth, ID band Patient awake    Reviewed: Allergy & Precautions, H&P , Patient's Chart, lab work & pertinent test results  Airway Mallampati: II TM Distance: >3 FB Neck ROM: full    Dental no notable dental hx.    Pulmonary neg pulmonary ROS, former smoker,  breath sounds clear to auscultation  Pulmonary exam normal       Cardiovascular hypertension, Pt. on medications negative cardio ROS  Rhythm:regular     Neuro/Psych negative neurological ROS  negative psych ROS   GI/Hepatic negative GI ROS, Neg liver ROS, GERD-  ,  Endo/Other  negative endocrine ROSdiabetes, Type 2, Oral Hypoglycemic AgentsMorbid obesity  Renal/GU negative Renal ROS     Musculoskeletal   Abdominal (+) + obese,   Peds  Hematology negative hematology ROS (+)   Anesthesia Other Findings   Reproductive/Obstetrics                           Anesthesia Physical Anesthesia Plan  ASA: III  Anesthesia Plan: General   Post-op Pain Management:    Induction: Intravenous  Airway Management Planned: LMA  Additional Equipment:   Intra-op Plan:   Post-operative Plan:   Informed Consent: I have reviewed the patients History and Physical, chart, labs and discussed the procedure including the risks, benefits and alternatives for the proposed anesthesia with the patient or authorized representative who has indicated his/her understanding and acceptance.     Plan Discussed with: CRNA and Surgeon  Anesthesia Plan Comments:         Anesthesia Quick Evaluation

## 2013-08-04 NOTE — Anesthesia Postprocedure Evaluation (Signed)
Anesthesia Post Note  Patient: Beth [name]  Procedure(s) Performed: Procedure(s) (LRB): DILATATION AND CURETTAGE /HYSTEROSCOPY (N/A)  Anesthesia type: General  Patient location: PACU  Post pain: Pain level controlled  Post assessment: Post-op Vital signs reviewed  Last Vitals:  Filed Vitals:   08/04/13 1226  BP: 151/88  Pulse:   Temp:   Resp:     Post vital signs: Reviewed  Level of consciousness: sedated  Complications: No apparent anesthesia complications

## 2013-08-04 NOTE — Discharge Instructions (Signed)
°  DISCHARGE INSTRUCTIONS: D&C/Hysteroscopy  The following instructions have been prepared to help you care for yourself upon your return home.   Personal hygiene:  Use sanitary pads for vaginal drainage, not tampons.  Shower the day after your procedure.  NO tub baths, pools or Jacuzzis for 2-3 weeks.  Wipe front to back after using the bathroom.  Activity and limitations:  Do NOT drive or operate any equipment for 24 hours. The effects of anesthesia are still present and drowsiness may result.  Do NOT rest in bed all day.  Walking is encouraged.  Walk up and down stairs slowly.  You may resume your normal activity in one to two days or as indicated by your physician.  Sexual activity: NO intercourse for at least 2 weeks after the procedure, or as indicated by your physician.  Diet: Eat a light meal as desired this evening. You may resume your usual diet tomorrow.  Return to work: You may resume your work activities in one to two days or as indicated by your doctor.  What to expect after your surgery: Expect to have vaginal bleeding/discharge for 2-3 days and spotting for up to 10 days. It is not unusual to have soreness for up to 1-2 weeks. You may have a slight burning sensation when you urinate for the first day. Mild cramps may continue for a couple of days. You may have a regular period in 2-6 weeks.  Call your doctor for any of the following:  Excessive vaginal bleeding, saturating and changing one pad every hour.  Inability to urinate 6 hours after discharge from hospital.  Pain not relieved by pain medication.  Fever of 100.4 F or greater.  Unusual vaginal discharge or odor.   Call for an appointment:    Patients signature: ______________________  Nurses signature ________________________  Support person's signature_______________________

## 2013-08-04 NOTE — Op Note (Signed)
Procedure: Diagnostic hysteroscopy and endometrial curettage Preoperative diagnosis: Simple and complex endometrial hyperplasia with atypia Postoperative diagnosis: Endometrial polyp Surgeon: Dr. Scheryl DarterJames Arnold Anesthesia: Gen. with LMA Specimen: Endometrial curettings Estimated blood loss: Negligible Complications: None Drains: None Counts: Correct   Patient had postmenopausal bleeding in October. Endometrial biopsy showed simple and complex hyperplasia with atypia. Patient gave written consent for hysteroscopy and dilation and curettage. Patient identification was confirmed and she was brought to the operating room and general anesthesia with LMA was induced. She was placed in dorsal lithotomy position. Perineum and vagina were sterilely prepped and draped. Her bladder was drained with a red rubber catheter. Exam revealed normal-size uterus no adnexal masses. Speculum was inserted and cervix was visualized. An intracervical block was placed using half percent Marcaine. Cervix was dilated sufficiently to pass the diagnostic hysteroscope with 1.5% glycine used. Video camera was in use. Panoramic view of the endometrial cavity was obtained both tubal ostia were seen. There was a polyp that originated from the right side of the lower uterine segment about 2 cm long. The resectoscope was inserted after the cervix was further dilated. Elected not to proceed with resection using a loop. Resectoscope was removed and endometrial curettage was performed in all quadrants and specimen was obtained. It appeared that the polyp had been seen was recovered in the specimen. The hysteroscope was reinserted and the polyp had been removed. There was no excessive bleeding. All instruments were removed. Patient tolerated the procedure well without complications and she was brought in stable condition to PACU.  Dr. Scheryl DarterJames Arnold  08/04/2013 2:26 PM

## 2013-08-04 NOTE — Transfer of Care (Signed)
Immediate Anesthesia Transfer of Care Note  Patient: Beth [name]  Procedure(s) Performed: Procedure(s): DILATATION AND CURETTAGE /HYSTEROSCOPY (N/A)  Patient Location: PACU  Anesthesia Type:General  Level of Consciousness: awake, alert , oriented and patient cooperative  Airway & Oxygen Therapy: Patient Spontanous Breathing and Patient connected to nasal cannula oxygen  Post-op Assessment: Report given to PACU RN and Post -op Vital signs reviewed and stable  Post vital signs: Reviewed and stable  Complications: No apparent anesthesia complications

## 2013-08-05 ENCOUNTER — Encounter (HOSPITAL_COMMUNITY): Payer: Self-pay | Admitting: Obstetrics & Gynecology

## 2013-08-10 ENCOUNTER — Ambulatory Visit: Payer: No Typology Code available for payment source | Admitting: Sports Medicine

## 2013-08-10 ENCOUNTER — Ambulatory Visit (HOSPITAL_COMMUNITY): Payer: No Typology Code available for payment source

## 2013-08-12 ENCOUNTER — Ambulatory Visit (INDEPENDENT_AMBULATORY_CARE_PROVIDER_SITE_OTHER): Payer: No Typology Code available for payment source | Admitting: Sports Medicine

## 2013-08-12 ENCOUNTER — Encounter: Payer: Self-pay | Admitting: Sports Medicine

## 2013-08-12 VITALS — BP 153/100 | HR 103 | Ht 68.0 in | Wt 321.0 lb

## 2013-08-12 DIAGNOSIS — M179 Osteoarthritis of knee, unspecified: Secondary | ICD-10-CM

## 2013-08-12 DIAGNOSIS — M1710 Unilateral primary osteoarthritis, unspecified knee: Secondary | ICD-10-CM

## 2013-08-12 DIAGNOSIS — M25562 Pain in left knee: Principal | ICD-10-CM

## 2013-08-12 DIAGNOSIS — IMO0002 Reserved for concepts with insufficient information to code with codable children: Secondary | ICD-10-CM

## 2013-08-12 DIAGNOSIS — M25569 Pain in unspecified knee: Secondary | ICD-10-CM

## 2013-08-12 DIAGNOSIS — M25561 Pain in right knee: Secondary | ICD-10-CM

## 2013-08-12 MED ORDER — METHYLPREDNISOLONE ACETATE 40 MG/ML IJ SUSP
40.0000 mg | Freq: Once | INTRAMUSCULAR | Status: AC
  Administered 2013-08-12: 40 mg via INTRA_ARTICULAR

## 2013-08-12 NOTE — Progress Notes (Signed)
   Subjective:    Patient ID: Beth [name], female    DOB: 10/11/1965, 48 y.o.   MRN: [id number]  HPI chief complaint: Bilateral knee pain  Patient returns to the office today requesting repeat cortisone injections into each knee. She has a well-documented history of bilateral knee DJD. Each knee was injected back in August and the injections did provide her with good symptom relief. She also takes Mobic as needed and this helps. She denies any recent trauma. Symptoms are worse with activity and improve some at rest.  Interim medical history is reviewed Current medications are reviewed    Review of Systems     Objective:   Physical Exam Obese, no acute distress. Awake alert and oriented x3 to  Examination of each knee shows range of motion from 0-120 bilaterally. 1+ boggy synovitis bilaterally. 1+ patellofemoral crepitus. Patient is tender to palpation along medial and lateral joint lines but a negative McMurray's. Good joint stability. Neurovascularly intact distally. Walking with an antalgic gait.      Assessment & Plan:  Returning bilateral knee pain secondary to DJD  Each of her knees were reinjected today with cortisone. Left knee was injected using an anterior lateral approach and the right knee was injected using an anterior medial approach. Patient tolerated this without difficulty. Continue on Mobic as needed. We can repeat these injections in 6 months if needed. Followup when necessary.  Consent obtained and verified. Time-out conducted. Noted no overlying erythema, induration, or other signs of local infection. Skin prepped in a sterile fashion. Topical analgesic spray: Ethyl chloride. Joint: right knee Needle: 22g 1.5 inch Completed without difficulty. Meds: 3cc 1% xylocaine, 1cc (40mg ) depomedrol  Consent obtained and verified. Time-out conducted. Noted no overlying erythema, induration, or other signs of local infection. Skin prepped in a sterile  fashion. Topical analgesic spray: Ethyl chloride. Joint: left knee Needle: 25g 1.5 inch Completed without difficulty. Meds: 3cc 1% xylocaine, 1cc (40mg ) depomedrol  Advised to call if fevers/chills, erythema, induration, drainage, or persistent bleeding.   Advised to call if fevers/chills, erythema, induration, drainage, or persistent bleeding.

## 2013-08-17 ENCOUNTER — Ambulatory Visit (HOSPITAL_COMMUNITY)
Admission: RE | Admit: 2013-08-17 | Discharge: 2013-08-17 | Disposition: A | Discharge status: Home / Self Care | Payer: No Typology Code available for payment source | Source: Ambulatory Visit | Attending: Internal Medicine | Admitting: Internal Medicine

## 2013-08-17 DIAGNOSIS — R0989 Other specified symptoms and signs involving the circulatory and respiratory systems: Principal | ICD-10-CM | POA: Insufficient documentation

## 2013-08-17 DIAGNOSIS — I10 Essential (primary) hypertension: Secondary | ICD-10-CM | POA: Insufficient documentation

## 2013-08-17 DIAGNOSIS — R0602 Shortness of breath: Secondary | ICD-10-CM | POA: Insufficient documentation

## 2013-08-17 DIAGNOSIS — M47814 Spondylosis without myelopathy or radiculopathy, thoracic region: Secondary | ICD-10-CM | POA: Insufficient documentation

## 2013-08-17 DIAGNOSIS — E785 Hyperlipidemia, unspecified: Secondary | ICD-10-CM

## 2013-08-17 DIAGNOSIS — I059 Rheumatic mitral valve disease, unspecified: Secondary | ICD-10-CM

## 2013-08-17 DIAGNOSIS — E119 Type 2 diabetes mellitus without complications: Secondary | ICD-10-CM | POA: Insufficient documentation

## 2013-08-17 DIAGNOSIS — R0609 Other forms of dyspnea: Secondary | ICD-10-CM | POA: Insufficient documentation

## 2013-08-17 NOTE — Progress Notes (Signed)
  Echocardiogram 2D Echocardiogram has been performed.  Beth [name], Beth [name] 08/17/2013, 1:18 PM

## 2013-08-19 ENCOUNTER — Telehealth: Payer: Self-pay | Admitting: *Deleted

## 2013-08-19 NOTE — Telephone Encounter (Signed)
Call completed as followed. 

## 2013-08-19 NOTE — Telephone Encounter (Signed)
Message copied by Kealan Buchan, [place] on Wed Aug 19, 2013  3:28 PM ------      Message from: Susie CassetteABROL MD, [facility]      Created: Wed Aug 19, 2013 11:25 AM       Chest x-ray negative ------

## 2013-08-24 ENCOUNTER — Telehealth: Payer: Self-pay | Admitting: *Deleted

## 2013-08-24 NOTE — Telephone Encounter (Signed)
Call completed as followed. 

## 2013-08-24 NOTE — Telephone Encounter (Signed)
Message copied by Elnoria Livingston, [place] on Mon Aug 24, 2013  1:59 PM ------      Message from: Susie CassetteABROL MD, [facility]YA[facility]      Created: Wed Aug 19, 2013  1:28 PM       Notify patient that 2-D echo is normal ------

## 2013-08-27 ENCOUNTER — Ambulatory Visit: Payer: No Typology Code available for payment source | Admitting: Internal Medicine

## 2013-09-02 ENCOUNTER — Other Ambulatory Visit: Payer: Self-pay | Admitting: Internal Medicine

## 2013-09-02 MED ORDER — FLUTICASONE-SALMETEROL 250-50 MCG/DOSE IN AEPB: 1.0000 | INHALATION_SPRAY | Freq: Two times a day (BID) | RESPIRATORY_TRACT | Status: AC

## 2013-09-02 MED ORDER — ALBUTEROL SULFATE HFA 108 (90 BASE) MCG/ACT IN AERS: 2.0000 | INHALATION_SPRAY | RESPIRATORY_TRACT | Status: DC | PRN

## 2013-09-03 ENCOUNTER — Ambulatory Visit (INDEPENDENT_AMBULATORY_CARE_PROVIDER_SITE_OTHER): Payer: No Typology Code available for payment source | Admitting: Obstetrics & Gynecology

## 2013-09-03 ENCOUNTER — Encounter: Payer: Self-pay | Admitting: Obstetrics & Gynecology

## 2013-09-03 VITALS — BP 170/92 | HR 93 | Ht 68.0 in | Wt 327.3 lb

## 2013-09-03 DIAGNOSIS — Z09 Encounter for follow-up examination after completed treatment for conditions other than malignant neoplasm: Secondary | ICD-10-CM

## 2013-09-03 DIAGNOSIS — N8502 Endometrial intraepithelial neoplasia [EIN]: Secondary | ICD-10-CM | POA: Insufficient documentation

## 2013-09-03 NOTE — Progress Notes (Signed)
Patient ID: Beth [name], female   DOB: 08/08/1965, 48 y.o.   MRN: [id number] Subjective:     Beth [name] is a 48 y.o. female who presents to the clinic 4 weeks status post hysteroscopy and D&C for abnormal uterine bleeding and abnormal bx result. Eating a regular diet without difficulty. Bowel movements are normal. The patient is not having any pain. Beth Bibleat revealed complex endometrial hyperplasia with focal atypia The following portions of the patient's history were reviewed and updated as appropriate: allergies, current medications, past family history, past medical history, past social history, past surgical history and problem list. Past Medical History  Diagnosis Date  . Diabetes mellitus   . Hypertension   . Asthma   . Arthritis   . Coronary artery disease   . Hyperlipidemia   . Phlebitis   . Asthmatic bronchitis , chronic   . Shortness of breath 05/23/11    "exertion; laying down"  . Depression   . Anxiety     Review of Systems Pertinent items are noted in HPI.    Objective:    BP 170/92  Pulse 93  Ht 5\' 8"  (1.727 m)  Wt 327 lb 4.8 oz (148.462 kg)  BMI 49.78 kg/m2 General:  alert, cooperative and no distress  Abdomen: obese  Incision:   none     Assessment:    Doing well postoperatively. Operative findings again reviewed. Pathology report discussed.    Plan:    1. Continue any current medications. 2. Wound care discussed. 3. Activity restrictions: none 4. Anticipated return to work: now. 5. Follow up: 2 weeks for Gyn onc consult re surgical vs hormonal management for  complex endometrial hyperplasia with focal atypia  Adam PhenixJames G Antwane Grose, MD 09/03/2013

## 2013-09-03 NOTE — Patient Instructions (Signed)

## 2013-09-03 NOTE — Progress Notes (Signed)
Had d&c on 08/04/13. No bleeding.

## 2013-09-08 ENCOUNTER — Ambulatory Visit: Payer: No Typology Code available for payment source | Admitting: Internal Medicine

## 2013-09-14 ENCOUNTER — Encounter: Payer: Self-pay | Admitting: Gynecology

## 2013-09-14 ENCOUNTER — Ambulatory Visit: Payer: No Typology Code available for payment source | Attending: Gynecology | Admitting: Gynecology

## 2013-09-14 VITALS — BP 192/111 | HR 97 | Temp 98.6°F | Resp 22 | Wt 331.9 lb

## 2013-09-14 DIAGNOSIS — I10 Essential (primary) hypertension: Secondary | ICD-10-CM | POA: Insufficient documentation

## 2013-09-14 DIAGNOSIS — E119 Type 2 diabetes mellitus without complications: Secondary | ICD-10-CM | POA: Insufficient documentation

## 2013-09-14 DIAGNOSIS — N8502 Endometrial intraepithelial neoplasia [EIN]: Secondary | ICD-10-CM | POA: Insufficient documentation

## 2013-09-14 DIAGNOSIS — F329 Major depressive disorder, single episode, unspecified: Secondary | ICD-10-CM | POA: Insufficient documentation

## 2013-09-14 DIAGNOSIS — J45909 Unspecified asthma, uncomplicated: Secondary | ICD-10-CM | POA: Insufficient documentation

## 2013-09-14 DIAGNOSIS — F3289 Other specified depressive episodes: Secondary | ICD-10-CM | POA: Insufficient documentation

## 2013-09-14 DIAGNOSIS — N926 Irregular menstruation, unspecified: Secondary | ICD-10-CM | POA: Insufficient documentation

## 2013-09-14 DIAGNOSIS — Z791 Long term (current) use of non-steroidal anti-inflammatories (NSAID): Secondary | ICD-10-CM | POA: Insufficient documentation

## 2013-09-14 DIAGNOSIS — N840 Polyp of corpus uteri: Secondary | ICD-10-CM | POA: Insufficient documentation

## 2013-09-14 DIAGNOSIS — E785 Hyperlipidemia, unspecified: Secondary | ICD-10-CM | POA: Insufficient documentation

## 2013-09-14 DIAGNOSIS — F411 Generalized anxiety disorder: Secondary | ICD-10-CM | POA: Insufficient documentation

## 2013-09-14 DIAGNOSIS — Z79899 Other long term (current) drug therapy: Secondary | ICD-10-CM | POA: Insufficient documentation

## 2013-09-14 DIAGNOSIS — I2510 Atherosclerotic heart disease of native coronary artery without angina pectoris: Secondary | ICD-10-CM | POA: Insufficient documentation

## 2013-09-14 NOTE — Progress Notes (Signed)
Consult Note: Gyn-Onc   Beth [name] 48 y.o. female  Chief Complaint  Patient presents with  . Referred after D&C    Assessment : Endometrial hyperplasia with focal atypia in endometrial polyp. Multiple comorbidities including obesity.  Plan: Treatment options were discussed with the patient and her mother. Given her multiple comorbidities is clear we would like to avoid surgery. Further, Megace is likely the cause her to gain even more weight. Therefore, I would recommend the hyperplasia be managed with a Mirena IUD. We'll contact Dr. Debroah LoopArnold and ask that he place the. IUD and then obtain a repeat endometrial biopsy in 3-4 months   HPI: 48 year old African American female seen in consultation request of Dr. Debroah LoopArnold regarding management of an endometrial polyp that showed simple and complex hyperplasia with focal atypia. The patient underwent a trans-vaginal ultrasound in December 2014 evaluate abnormal uterine bleeding. Endometrial stripe was found to be 7.7 mm. The patient subsequently underwent a D&C showing endometrial polyp with simple and complex hyperplasia with focal atypia. On a D&C the patient's had no further bleeding.  Her menstrual history is one of irregular periods over the last year or so. She has not had any bleeding since December according to her history. She has no other significant gynecologic history.  Patient does have a number of medical comorbidities including diabetes, hypertension, asthma, morbid obesity,.  Review of Systems:10 point review of systems is negative except as noted in interval history.   Vitals: Blood pressure 192/111, pulse 97, temperature 98.6 F (37 C), temperature source Oral, resp. rate 22, weight 331 lb 14.4 oz (150.549 kg).  Physical Exam: General : The patient is a healthy woman in no acute distress.  HEENT: normocephalic, extraoccular movements normal; neck is supple without thyromegally  Lynphnodes: Supraclavicular and inguinal nodes  not enlarged  Abdomen: Morbidly obese, Soft, non-tender, no ascites, no organomegally, no masses, no hernias  Pelvic:  EGBUS: Normal female  Vagina: Normal, no lesions  Urethra and Bladder: Normal, non-tender  Cervix: Normal   Uterus: Difficult to evaluate based on the patient's habitus.  Bi-manual examination: Non-tender; no adenxal masses or nodularity  Rectal: normal sphincter tone, no masses, no blood  Lower extremities: No edema or varicosities. Normal range of motion      No Known Allergies  Past Medical History  Diagnosis Date  . Diabetes mellitus   . Hypertension   . Asthma   . Arthritis   . Coronary artery disease   . Hyperlipidemia   . Phlebitis   . Asthmatic bronchitis , chronic   . Shortness of breath 05/23/11    "exertion; laying down"  . Depression   . Anxiety     Past Surgical History  Procedure Laterality Date  . Hernia repair    . Umbilical hernia repair  05/2004  . Cholecystectomy    . Dilation and curettage of uterus    . Hysteroscopy w/d&c N/A 08/04/2013    Procedure: DILATATION AND CURETTAGE /HYSTEROSCOPY;  Surgeon: Adam PhenixJames G Arnold, MD;  Location: [facility];  Service: Gynecology;  Laterality: N/A;    Current Outpatient Prescriptions  Medication Sig Dispense Refill  . albuterol (PROVENTIL HFA;VENTOLIN HFA) 108 (90 BASE) MCG/ACT inhaler Inhale 2 puffs into the lungs every 4 (four) hours as needed for wheezing or shortness of breath. For shortness of breath  3 Inhaler  3  . ARIPiprazole (ABILIFY) 10 MG tablet Take 1 tablet (10 mg total) by mouth daily.  90 tablet  1 year  . atenolol (TENORMIN)  25 MG tablet Take 1 tablet (25 mg total) by mouth daily.  30 tablet  5  . atorvastatin (LIPITOR) 10 MG tablet Take 1 tablet (10 mg total) by mouth daily.  30 tablet  5  . DULoxetine (CYMBALTA) 60 MG capsule Take 1 capsule (60 mg total) by mouth daily.  90 capsule  1 year  . Fluticasone-Salmeterol (ADVAIR) 250-50 MCG/DOSE AEPB Inhale 1 puff into the lungs every 12  (twelve) hours. For shortness of breath  3 each  3  . gabapentin (NEURONTIN) 600 MG tablet Take 1 tablet (600 mg total) by mouth 3 (three) times daily.  90 tablet  5  . hydrochlorothiazide (HYDRODIURIL) 12.5 MG tablet Take 2 tablets (25 mg total) by mouth daily.  30 tablet  5  . loratadine (CLARITIN) 10 MG tablet Take 1 tablet (10 mg total) by mouth daily.  30 tablet  5  . meloxicam (MOBIC) 15 MG tablet Take 1 tablet (15 mg total) by mouth daily as needed for pain. Take with food  30 tablet  5  . pantoprazole (PROTONIX) 40 MG tablet Take 1 tablet (40 mg total) by mouth daily.  30 tablet  5  . sertraline (ZOLOFT) 100 MG tablet Take 1 tablet (100 mg total) by mouth daily.  30 tablet  5  . [DISCONTINUED] buPROPion (WELLBUTRIN XL) 150 MG 24 hr tablet Take 150 mg by mouth daily.        . [DISCONTINUED] metoprolol succinate (TOPROL-XL) 100 MG 24 hr tablet Take 1 tablet (100 mg total) by mouth daily.  30 tablet  2  . [DISCONTINUED] pravastatin (PRAVACHOL) 40 MG tablet Take 40 mg by mouth daily.        . [DISCONTINUED] risperiDONE (RISPERDAL) 2 MG tablet Take 2 mg by mouth at bedtime.         No current facility-administered medications for this visit.    History   Social History  . Marital Status: Divorced    Spouse Name: N/A    Number of Children: N/A  . Years of Education: N/A   Occupational History  . Not on file.   Social History Main Topics  . Smoking status: Former Smoker -- 0.25 packs/day for 19 years    Types: Cigarettes  . Smokeless tobacco: Never Used  . Alcohol Use: Yes     Comment: "quit alcohol ~ 2005; only then drank occasionl wine cooler"  . Drug Use: Yes    Special: Cocaine, Marijuana, "Crack" cocaine     Comment: last used 10 yrs ago  . Sexual Activity: No     Comment: "quit smoking cigarettes ~2005"   Other Topics Concern  . Not on file   Social History Narrative  . No narrative on file    Family History  Problem Relation Age of Onset  . Depression Mother    . Hypertension Mother   . Diabetes Mother   . Hypertension Father   . Hyperlipidemia Father   . Depression Sister       Jeannette Corpus, MD 09/14/2013, 2:18 PM

## 2013-09-14 NOTE — Patient Instructions (Addendum)
Follow up with Dr. Debroah LoopArnold. Contact Dr. Olivia MackieArnold's office to arrange for an appointment for IUD placement.  Please call our office if you have any questions or concerns.  Intrauterine Device Information  An intrauterine device (IUD) is inserted into your uterus to treat complex hyperplasia. There are two types of IUDs available:   Copper IUD This type of IUD is wrapped in copper wire and is placed inside the uterus. Copper makes the uterus and fallopian tubes produce a fluid that kills sperm. The copper IUD can stay in place for 10 years. Hormone IUD This type of IUD contains the hormone progestin (synthetic progesterone). Discuss with your health care provider the possible side effects.  ADVANTAGES OF AN INTRAUTERINE DEVICE  IUDs are highly effective, reversible, long acting, and low maintenance.   There are no estrogen-related side effects.   An IUD can be used when breastfeeding.   IUDs are not associated with weight gain.   The copper IUD works immediately after insertion.   The hormone IUD works right away if inserted within 7 days of your period starting. You will need to use a backup method of birth control for 7 days if the hormone IUD is inserted at any other time in your cycle.  The copper IUD does not interfere with your female hormones.   The hormone IUD can make heavy menstrual periods lighter and decrease cramping.   The hormone IUD can be used for 3 or 5 years.   The copper IUD can be used for 10 years. DISADVANTAGES OF AN INTRAUTERINE DEVICE  The hormone IUD can be associated with irregular bleeding patterns.   The copper IUD can make your menstrual flow heavier and more painful.   You may experience cramping and vaginal bleeding after insertion.  Document Released: 05/15/2004 Document Revised: 02/11/2013 Document Reviewed: 11/30/2012 [facility] Patient Information 2014 [place], [place].

## 2013-09-16 ENCOUNTER — Other Ambulatory Visit: Payer: Self-pay | Admitting: Internal Medicine

## 2013-09-16 MED ORDER — ARIPIPRAZOLE 20 MG PO TABS: 20.0000 mg | ORAL_TABLET | Freq: Every day | ORAL | Status: AC

## 2013-09-22 ENCOUNTER — Telehealth: Payer: Self-pay | Admitting: *Deleted

## 2013-09-22 NOTE — Telephone Encounter (Signed)
Patient called nurse line and requested call back regarding information for appointment.  Spoke with patient and said she is needing an appointment with Dr. Debroah LoopArnold, that the Cancer center will send him notes regarding her diagnosis.

## 2013-10-12 ENCOUNTER — Ambulatory Visit: Payer: No Typology Code available for payment source | Attending: Internal Medicine | Admitting: Internal Medicine

## 2013-10-12 ENCOUNTER — Encounter: Payer: Self-pay | Admitting: Internal Medicine

## 2013-10-12 VITALS — BP 151/90 | HR 92 | Temp 98.0°F | Resp 18 | Ht 68.0 in | Wt 330.0 lb

## 2013-10-12 DIAGNOSIS — I10 Essential (primary) hypertension: Secondary | ICD-10-CM | POA: Insufficient documentation

## 2013-10-12 DIAGNOSIS — F329 Major depressive disorder, single episode, unspecified: Secondary | ICD-10-CM | POA: Insufficient documentation

## 2013-10-12 DIAGNOSIS — F411 Generalized anxiety disorder: Secondary | ICD-10-CM | POA: Insufficient documentation

## 2013-10-12 DIAGNOSIS — Z791 Long term (current) use of non-steroidal anti-inflammatories (NSAID): Secondary | ICD-10-CM | POA: Insufficient documentation

## 2013-10-12 DIAGNOSIS — J45909 Unspecified asthma, uncomplicated: Secondary | ICD-10-CM | POA: Insufficient documentation

## 2013-10-12 DIAGNOSIS — IMO0002 Reserved for concepts with insufficient information to code with codable children: Secondary | ICD-10-CM

## 2013-10-12 DIAGNOSIS — F3289 Other specified depressive episodes: Secondary | ICD-10-CM | POA: Insufficient documentation

## 2013-10-12 DIAGNOSIS — F17200 Nicotine dependence, unspecified, uncomplicated: Secondary | ICD-10-CM | POA: Insufficient documentation

## 2013-10-12 DIAGNOSIS — E669 Obesity, unspecified: Secondary | ICD-10-CM | POA: Insufficient documentation

## 2013-10-12 DIAGNOSIS — M1712 Unilateral primary osteoarthritis, left knee: Secondary | ICD-10-CM

## 2013-10-12 DIAGNOSIS — E119 Type 2 diabetes mellitus without complications: Secondary | ICD-10-CM | POA: Insufficient documentation

## 2013-10-12 DIAGNOSIS — I2510 Atherosclerotic heart disease of native coronary artery without angina pectoris: Secondary | ICD-10-CM | POA: Insufficient documentation

## 2013-10-12 DIAGNOSIS — M1710 Unilateral primary osteoarthritis, unspecified knee: Secondary | ICD-10-CM | POA: Insufficient documentation

## 2013-10-12 DIAGNOSIS — Z79899 Other long term (current) drug therapy: Secondary | ICD-10-CM | POA: Insufficient documentation

## 2013-10-12 DIAGNOSIS — E785 Hyperlipidemia, unspecified: Secondary | ICD-10-CM | POA: Insufficient documentation

## 2013-10-12 LAB — GLUCOSE, POCT (MANUAL RESULT ENTRY): POC Glucose: 131 mg/dl — AB (ref 70–99)

## 2013-10-12 MED ORDER — ACETAMINOPHEN-CODEINE #3 300-30 MG PO TABS: 1.0000 | ORAL_TABLET | ORAL | Status: DC | PRN

## 2013-10-12 NOTE — Patient Instructions (Signed)
Diabetes Meal Planning Guide The diabetes meal planning guide is a tool to help you plan your meals and snacks. It is important for people with diabetes to manage their blood glucose (sugar) levels. Choosing the right foods and the right amounts throughout your day will help control your blood glucose. Eating right can even help you improve your blood pressure and reach or maintain a healthy weight. CARBOHYDRATE COUNTING MADE EASY When you eat carbohydrates, they turn to sugar. This raises your blood glucose level. Counting carbohydrates can help you control this level so you feel better. When you plan your meals by counting carbohydrates, you can have more flexibility in what you eat and balance your medicine with your food intake. Carbohydrate counting simply means adding up the total amount of carbohydrate grams in your meals and snacks. Try to eat about the same amount at each meal. Foods with carbohydrates are listed below. Each portion below is 1 carbohydrate serving or 15 grams of carbohydrates. Ask your dietician how many grams of carbohydrates you should eat at each meal or snack. Grains and Starches  1 slice bread.   English muffin or hotdog/hamburger bun.   cup cold cereal (unsweetened).   cup cooked pasta or rice.   cup starchy vegetables (corn, potatoes, peas, beans, winter squash).  1 tortilla (6 inches).   bagel.  1 waffle or pancake (size of a CD).   cup cooked cereal.  4 to 6 small crackers. *Whole grain is recommended. Fruit  1 cup fresh unsweetened berries, melon, papaya, pineapple.  1 small fresh fruit.   banana or mango.   cup fruit juice (4 oz unsweetened).   cup canned fruit in natural juice or water.  2 tbs dried fruit.  12 to 15 grapes or cherries. Milk and Yogurt  1 cup fat-free or 1% milk.  1 cup soy milk.  6 oz light yogurt with sugar-free sweetener.  6 oz low-fat soy yogurt.  6 oz plain yogurt. Vegetables  1 cup raw or  cup  cooked is counted as 0 carbohydrates or a "free" food.  If you eat 3 or more servings at 1 meal, count them as 1 carbohydrate serving. Other Carbohydrates   oz chips or pretzels.   cup ice cream or frozen yogurt.   cup sherbet or sorbet.  2 inch square cake, no frosting.  1 tbs honey, sugar, jam, jelly, or syrup.  2 small cookies.  3 squares of graham crackers.  3 cups popcorn.  6 crackers.  1 cup broth-based soup.  Count 1 cup casserole or other mixed foods as 2 carbohydrate servings.  Foods with less than 20 calories in a serving may be counted as 0 carbohydrates or a "free" food. You may want to purchase a book or computer software that lists the carbohydrate gram counts of different foods. In addition, the nutrition facts panel on the labels of the foods you eat are a good source of this information. The label will tell you how big the serving size is and the total number of carbohydrate grams you will be eating per serving. Divide this number by 15 to obtain the number of carbohydrate servings in a portion. Remember, 1 carbohydrate serving equals 15 grams of carbohydrate. SERVING SIZES Measuring foods and serving sizes helps you make sure you are getting the right amount of food. The list below tells how big or small some common serving sizes are.  1 oz.........4 stacked dice.  3 oz.........Deck of cards.  1 tsp........Tip   of little finger.  1 tbs........Thumb.  2 tbs........Golf ball.   cup.......Half of a fist.  1 cup........A fist. SAMPLE DIABETES MEAL PLAN Below is a sample meal plan that includes foods from the grain and starches, dairy, vegetable, fruit, and meat groups. A dietician can individualize a meal plan to fit your calorie needs and tell you the number of servings needed from each food group. However, controlling the total amount of carbohydrates in your meal or snack is more important than making sure you include all of the food groups at every  meal. You may interchange carbohydrate containing foods (dairy, starches, and fruits). The meal plan below is an example of a 2000 calorie diet using carbohydrate counting. This meal plan has 17 carbohydrate servings. Breakfast  1 cup oatmeal (2 carb servings).   cup light yogurt (1 carb serving).  1 cup blueberries (1 carb serving).   cup almonds. Snack  1 large apple (2 carb servings).  1 low-fat string cheese stick. Lunch  Chicken breast salad.  1 cup spinach.   cup chopped tomatoes.  2 oz chicken breast, sliced.  2 tbs low-fat Italian dressing.  12 whole-wheat crackers (2 carb servings).  12 to 15 grapes (1 carb serving).  1 cup low-fat milk (1 carb serving). Snack  1 cup carrots.   cup hummus (1 carb serving). Dinner  3 oz broiled salmon.  1 cup brown rice (3 carb servings). Snack  1  cups steamed broccoli (1 carb serving) drizzled with 1 tsp olive oil and lemon juice.  1 cup light pudding (2 carb servings). DIABETES MEAL PLANNING WORKSHEET Your dietician can use this worksheet to help you decide how many servings of foods and what types of foods are right for you.  BREAKFAST Food Group and Servings / Carb Servings Grain/Starches __________________________________ Dairy __________________________________________ Vegetable ______________________________________ Fruit ___________________________________________ Meat __________________________________________ Fat ____________________________________________ LUNCH Food Group and Servings / Carb Servings Grain/Starches ___________________________________ Dairy ___________________________________________ Fruit ____________________________________________ Meat ___________________________________________ Fat _____________________________________________ DINNER Food Group and Servings / Carb Servings Grain/Starches ___________________________________ Dairy  ___________________________________________ Fruit ____________________________________________ Meat ___________________________________________ Fat _____________________________________________ SNACKS Food Group and Servings / Carb Servings Grain/Starches ___________________________________ Dairy ___________________________________________ Vegetable _______________________________________ Fruit ____________________________________________ Meat ___________________________________________ Fat _____________________________________________ DAILY TOTALS Starches _________________________ Vegetable ________________________ Fruit ____________________________ Dairy ____________________________ Meat ____________________________ Fat ______________________________ Document Released: 03/08/2005 Document Revised: 09/03/2011 Document Reviewed: 01/17/2009 [employer] Patient Information 2014 [employer]. DASH Diet The DASH diet stands for "Dietary Approaches to Stop Hypertension." It is a healthy eating plan that has been shown to reduce high blood pressure (hypertension) in as little as 14 days, while also possibly providing other significant health benefits. These other health benefits include reducing the risk of breast cancer after menopause and reducing the risk of type 2 diabetes, heart disease, colon cancer, and stroke. Health benefits also include weight loss and slowing kidney failure in patients with chronic kidney disease.  DIET GUIDELINES  Limit salt (sodium). Your diet should contain less than 1500 mg of sodium daily.  Limit refined or processed carbohydrates. Your diet should include mostly whole grains. Desserts and added sugars should be used sparingly.  Include small amounts of heart-healthy fats. These types of fats include nuts, oils, and tub margarine. Limit saturated and trans fats. These fats have been shown to be harmful in the body. CHOOSING FOODS  The following food groups  are based on a 2000 calorie diet. See your Registered Dietitian for individual calorie needs. Grains and Grain Products (6 to 8 servings daily)  Eat More Often:   Whole-wheat bread, brown rice, whole-grain or wheat pasta, quinoa, popcorn without added fat or salt (air popped).  Eat Less Often: White bread, white pasta, white rice, cornbread. Vegetables (4 to 5 servings daily)  Eat More Often: Fresh, frozen, and canned vegetables. Vegetables may be raw, steamed, roasted, or grilled with a minimal amount of fat.  Eat Less Often/Avoid: Creamed or fried vegetables. Vegetables in a cheese sauce. Fruit (4 to 5 servings daily)  Eat More Often: All fresh, canned (in natural juice), or frozen fruits. Dried fruits without added sugar. One hundred percent fruit juice ( cup [237 mL] daily).  Eat Less Often: Dried fruits with added sugar. Canned fruit in light or heavy syrup. [employer], Fish, and Poultry (2 servings or less daily. One serving is 3 to 4 oz [85-114 g]).  Eat More Often: Ninety percent or leaner ground beef, tenderloin, sirloin. Round cuts of beef, chicken breast, [place] breast. All fish. Grill, bake, or broil your meat. Nothing should be fried.  Eat Less Often/Avoid: Fatty cuts of meat, [place], or chicken leg, thigh, or wing. Fried cuts of meat or fish. Dairy (2 to 3 servings)  Eat More Often: Low-fat or fat-free milk, low-fat plain or light yogurt, reduced-fat or part-skim cheese.  Eat Less Often/Avoid: Milk (whole, 2%).Whole milk yogurt. Full-fat cheeses. Nuts, Seeds, and Legumes (4 to 5 servings per week)  Eat More Often: All without added salt.  Eat Less Often/Avoid: Salted nuts and seeds, canned beans with added salt. Fats and Sweets (limited)  Eat More Often: Vegetable oils, tub margarines without trans fats, sugar-free gelatin. Mayonnaise and salad dressings.  Eat Less Often/Avoid: Coconut oils, palm oils, butter, stick margarine, cream, half and half, cookies, candy,  pie. FOR MORE INFORMATION The Dash Diet Eating Plan: www.dashdiet.org Document Released: 05/31/2011 Document Revised: 09/03/2011 Document Reviewed: 05/31/2011 [facility] Patient Information 2014 [place], [place]. Hypertension As your heart beats, it forces blood through your arteries. This force is your blood pressure. If the pressure is too high, it is called hypertension (HTN) or high blood pressure. HTN is dangerous because you may have it and not know it. High blood pressure may mean that your heart has to work harder to pump blood. Your arteries may be narrow or stiff. The extra work puts you at risk for heart disease, stroke, and other problems.  Blood pressure consists of two numbers, a higher number over a lower, 110/72, for example. It is stated as "110 over 72." The ideal is below 120 for the top number (systolic) and under 80 for the bottom (diastolic). Write down your blood pressure today. You should pay close attention to your blood pressure if you have certain conditions such as:  Heart failure.  Prior heart attack.  Diabetes  Chronic kidney disease.  Prior stroke.  Multiple risk factors for heart disease. To see if you have HTN, your blood pressure should be measured while you are seated with your arm held at the level of the heart. It should be measured at least twice. A one-time elevated blood pressure reading (especially in the Emergency Department) does not mean that you need treatment. There may be conditions in which the blood pressure is different between your right and left arms. It is important to see your caregiver soon for a recheck. Most people have essential hypertension which means that there is not a specific cause. This type of high blood pressure may be lowered by changing lifestyle factors such as:  Stress.  Smoking.  Lack of  exercise.  Excessive weight.  Drug/tobacco/alcohol use.  Eating less salt. Most people do not have symptoms from high blood  pressure until it has caused damage to the body. Effective treatment can often prevent, delay or reduce that damage. TREATMENT  When a cause has been identified, treatment for high blood pressure is directed at the cause. There are a large number of medications to treat HTN. These fall into several categories, and your caregiver will help you select the medicines that are best for you. Medications may have side effects. You should review side effects with your caregiver. If your blood pressure stays high after you have made lifestyle changes or started on medicines,   Your medication(s) may need to be changed.  Other problems may need to be addressed.  Be certain you understand your prescriptions, and know how and when to take your medicine.  Be sure to follow up with your caregiver within the time frame advised (usually within two weeks) to have your blood pressure rechecked and to review your medications.  If you are taking more than one medicine to lower your blood pressure, make sure you know how and at what times they should be taken. Taking two medicines at the same time can result in blood pressure that is too low. SEEK IMMEDIATE MEDICAL CARE IF:  You develop a severe headache, blurred or changing vision, or confusion.  You have unusual weakness or numbness, or a faint feeling.  You have severe chest or abdominal pain, vomiting, or breathing problems. MAKE SURE YOU:   Understand these instructions.  Will watch your condition.  Will get help right away if you are not doing well or get worse. Document Released: 06/11/2005 Document Revised: 09/03/2011 Document Reviewed: 01/30/2008 [facility]Exit[facility] Patient Information 2014 [place]Care, [place].

## 2013-10-12 NOTE — Progress Notes (Signed)
Pt here to f/u with cholesterol and Diabetes without medication [id number] Pt c/o chronic lower back pain radiating to right hip Sob noted with exertion

## 2013-10-12 NOTE — Progress Notes (Signed)
Patient ID: Beth [name], female   DOB: 07/22/1965, 48 y.o.   MRN: [id number]   Beth [name], is a 48 y.o. female  BJY:[id number]  YQM:[id number]  DOB - 05/28/1966  Chief Complaint  Patient presents with  . Follow-up  . Diabetes  . Hypertension        Subjective:   Beth [name] is a 48 y.o. female here today for a follow up visit. Patient is known to have hypertension, prediabetes, asthma, hyperlipidemia, depression and anxiety here today for followup. She has not taken her medications today, blood pressure is slightly high in the clinic. Patient was extensively worked up recently for autoimmune disorders, negative. Patient has all her medications, does not need refill. Her concern today is ongoing pain in her knees, she has seen an orthopedic surgeon before who advised to wait on knee replacement because of her age, patient is young and will need another replacement if she were to have one now. She continues to smoke cigarette about half a pack a day, she is not able to exercise because of her knees. She is doing well from a depression standpoint, she denies any suicidal ideation or thoughts. She has no leg swelling. She uses inhaler for her asthma, controlled. Patient has No headache, No chest pain, No abdominal pain - No Nausea, No new weakness tingling or numbness, No Cough - SOB.  Problem  Htn (Hypertension)  Diabetes    ALLERGIES: No Known Allergies  PAST MEDICAL HISTORY: Past Medical History  Diagnosis Date  . Diabetes mellitus   . Hypertension   . Asthma   . Arthritis   . Coronary artery disease   . Hyperlipidemia   . Phlebitis   . Asthmatic bronchitis , chronic   . Shortness of breath 05/23/11    "exertion; laying down"  . Depression   . Anxiety     MEDICATIONS AT HOME: Prior to Admission medications   Medication Sig Start Date End Date Taking? Authorizing Provider  albuterol (PROVENTIL HFA;VENTOLIN HFA) 108 (90 BASE) MCG/ACT inhaler Inhale 2 puffs into the  lungs every 4 (four) hours as needed for wheezing or shortness of breath. For shortness of breath 09/02/13  Yes Jeanann Lewandowskylugbemiga Arda Keadle, MD  ARIPiprazole (ABILIFY) 20 MG tablet Take 1 tablet (20 mg total) by mouth daily. 09/16/13  Yes Jeanann Lewandowskylugbemiga Lauran Romanski, MD  atenolol (TENORMIN) 25 MG tablet Take 1 tablet (25 mg total) by mouth daily. 04/30/13  Yes Alison MurrayAlma M Devine, MD  atorvastatin (LIPITOR) 10 MG tablet Take 1 tablet (10 mg total) by mouth daily. 04/30/13  Yes Alison MurrayAlma M Devine, MD  DULoxetine (CYMBALTA) 60 MG capsule Take 1 capsule (60 mg total) by mouth daily. 05/26/13  Yes Jeanann Lewandowskylugbemiga Meshulem Onorato, MD  gabapentin (NEURONTIN) 600 MG tablet Take 1 tablet (600 mg total) by mouth 3 (three) times daily. 04/30/13  Yes Alison MurrayAlma M Devine, MD  hydrochlorothiazide (HYDRODIURIL) 12.5 MG tablet Take 2 tablets (25 mg total) by mouth daily. 04/30/13  Yes Alison MurrayAlma M Devine, MD  loratadine (CLARITIN) 10 MG tablet Take 1 tablet (10 mg total) by mouth daily. 04/30/13  Yes Alison MurrayAlma M Devine, MD  meloxicam (MOBIC) 15 MG tablet Take 1 tablet (15 mg total) by mouth daily as needed for pain. Take with food 04/30/13  Yes Alison MurrayAlma M Devine, MD  pantoprazole (PROTONIX) 40 MG tablet Take 1 tablet (40 mg total) by mouth daily. 04/30/13  Yes Alison MurrayAlma M Devine, MD  sertraline (ZOLOFT) 100 MG tablet Take 1 tablet (100 mg total) by mouth daily. 04/30/13  Yes Alison MurrayAlma M Devine, MD  acetaminophen-codeine (TYLENOL #3) 300-30 MG per tablet Take 1 tablet by mouth every 4 (four) hours as needed. 10/12/13   Jeanann Lewandowskylugbemiga Shalla Bulluck, MD  Fluticasone-Salmeterol (ADVAIR) 250-50 MCG/DOSE AEPB Inhale 1 puff into the lungs every 12 (twelve) hours. For shortness of breath 09/02/13   Jeanann Lewandowskylugbemiga Khadijah Mastrianni, MD     Objective:   Filed Vitals:   10/12/13 1546  BP: 151/90  Pulse: 92  Temp: 98 F (36.7 C)  TempSrc: Oral  Resp: 18  Height: 5\' 8"  (1.727 m)  Weight: 330 lb (149.687 kg)  SpO2: 100%    Exam General appearance : Awake, alert, not in any distress. Speech Clear. Not toxic looking,  obese HEENT: Atraumatic and Normocephalic, pupils equally reactive to light and accomodation Neck: supple, no JVD. No cervical lymphadenopathy.  Chest:Good air entry bilaterally, no added sounds  CVS: S1 S2 regular, no murmurs.  Abdomen: Bowel sounds present, Non tender and not distended with no gaurding, rigidity or rebound. Extremities: B/L Lower Ext shows no edema, both legs are warm to touch Neurology: Awake alert, and oriented X 3, CN II-XII intact, Non focal Skin:No Rash Wounds:N/A  Data Review Lab Results  Component Value Date   HGBA1C 5.9 07/31/2013   HGBA1C 5.6 04/30/2013   HGBA1C 5.9* 12/05/2012     Assessment & Plan   1. Diabetes  - Glucose (CBG)  2. Obesity, unspecified Patient was counseled extensively on nutrition and exercise  3. HTN (hypertension) Patient was encouraged to be compliant with medications DASH diet We discussed blood pressure goals and he needs to adhere strictly with instructions including nutrition and exercise  4. Degenerative joint disease of knee, left  - acetaminophen-codeine (TYLENOL #3) 300-30 MG per tablet; Take 1 tablet by mouth every 4 (four) hours as needed.  Dispense: 60 tablet; Refill: 0  Patient was counseled extensively on smoking cessation   Return in about 3 months (around 01/11/2014), or if symptoms worsen or fail to improve, for Hemoglobin A1C and Follow up, DM, Follow up HTN.  The patient was given clear instructions to go to ER or return to medical center if symptoms don't improve, worsen or new problems develop. The patient verbalized understanding. The patient was told to call to get lab results if they haven't heard anything in the next week.   This note has been created with Education officer, environmentalDragon speech recognition software and smart phrase technology. Any transcriptional errors are unintentional.    Jeanann Lewandowskylugbemiga Karalee Hauter, MD, MHA, FACP, [facility] [place], [place] [phone]   10/12/2013,  4:13 PM

## 2013-10-21 ENCOUNTER — Ambulatory Visit: Payer: No Typology Code available for payment source | Admitting: Obstetrics & Gynecology

## 2013-12-02 ENCOUNTER — Ambulatory Visit: Payer: Self-pay

## 2013-12-13 ENCOUNTER — Inpatient Hospital Stay (HOSPITAL_COMMUNITY)
Admission: EM | Admit: 2013-12-13 | Discharge: 2013-12-15 | DRG: 202 | Disposition: A | Discharge status: Home / Self Care | Payer: Self-pay | Attending: Internal Medicine | Admitting: Internal Medicine

## 2013-12-13 ENCOUNTER — Emergency Department (HOSPITAL_COMMUNITY): Payer: Self-pay

## 2013-12-13 ENCOUNTER — Encounter (HOSPITAL_COMMUNITY): Payer: Self-pay | Admitting: Emergency Medicine

## 2013-12-13 DIAGNOSIS — E089 Diabetes mellitus due to underlying condition without complications: Secondary | ICD-10-CM

## 2013-12-13 DIAGNOSIS — F32A Depression, unspecified: Secondary | ICD-10-CM | POA: Diagnosis present

## 2013-12-13 DIAGNOSIS — F3289 Other specified depressive episodes: Secondary | ICD-10-CM | POA: Diagnosis present

## 2013-12-13 DIAGNOSIS — Z79899 Other long term (current) drug therapy: Secondary | ICD-10-CM

## 2013-12-13 DIAGNOSIS — J4541 Moderate persistent asthma with (acute) exacerbation: Secondary | ICD-10-CM

## 2013-12-13 DIAGNOSIS — I10 Essential (primary) hypertension: Secondary | ICD-10-CM

## 2013-12-13 DIAGNOSIS — Z87891 Personal history of nicotine dependence: Secondary | ICD-10-CM

## 2013-12-13 DIAGNOSIS — J45901 Unspecified asthma with (acute) exacerbation: Principal | ICD-10-CM

## 2013-12-13 DIAGNOSIS — E119 Type 2 diabetes mellitus without complications: Secondary | ICD-10-CM | POA: Diagnosis present

## 2013-12-13 DIAGNOSIS — J40 Bronchitis, not specified as acute or chronic: Secondary | ICD-10-CM

## 2013-12-13 DIAGNOSIS — E785 Hyperlipidemia, unspecified: Secondary | ICD-10-CM | POA: Diagnosis present

## 2013-12-13 DIAGNOSIS — J4521 Mild intermittent asthma with (acute) exacerbation: Secondary | ICD-10-CM

## 2013-12-13 DIAGNOSIS — I2510 Atherosclerotic heart disease of native coronary artery without angina pectoris: Secondary | ICD-10-CM | POA: Diagnosis present

## 2013-12-13 DIAGNOSIS — Z6841 Body Mass Index (BMI) 40.0 and over, adult: Secondary | ICD-10-CM

## 2013-12-13 DIAGNOSIS — J45909 Unspecified asthma, uncomplicated: Secondary | ICD-10-CM | POA: Diagnosis present

## 2013-12-13 DIAGNOSIS — E669 Obesity, unspecified: Secondary | ICD-10-CM | POA: Diagnosis present

## 2013-12-13 DIAGNOSIS — F411 Generalized anxiety disorder: Secondary | ICD-10-CM | POA: Diagnosis present

## 2013-12-13 DIAGNOSIS — E876 Hypokalemia: Secondary | ICD-10-CM | POA: Diagnosis present

## 2013-12-13 DIAGNOSIS — Z833 Family history of diabetes mellitus: Secondary | ICD-10-CM

## 2013-12-13 DIAGNOSIS — F329 Major depressive disorder, single episode, unspecified: Secondary | ICD-10-CM | POA: Diagnosis present

## 2013-12-13 DIAGNOSIS — Z8249 Family history of ischemic heart disease and other diseases of the circulatory system: Secondary | ICD-10-CM

## 2013-12-13 DIAGNOSIS — G629 Polyneuropathy, unspecified: Secondary | ICD-10-CM

## 2013-12-13 DIAGNOSIS — K219 Gastro-esophageal reflux disease without esophagitis: Secondary | ICD-10-CM | POA: Diagnosis present

## 2013-12-13 HISTORY — DX: Gastro-esophageal reflux disease without esophagitis: K21.9

## 2013-12-13 HISTORY — DX: Personal history of other medical treatment: Z92.89

## 2013-12-13 HISTORY — DX: Type 2 diabetes mellitus without complications: E11.9

## 2013-12-13 LAB — CBC WITH DIFFERENTIAL/PLATELET
Basophils Absolute: 0 10*3/uL (ref 0.0–0.1)
Basophils Relative: 0 % (ref 0–1)
EOS ABS: 0.3 10*3/uL (ref 0.0–0.7)
EOS PCT: 3 % (ref 0–5)
HEMATOCRIT: 38.1 % (ref 36.0–46.0)
HEMOGLOBIN: 12.4 g/dL (ref 12.0–15.0)
LYMPHS ABS: 2.8 10*3/uL (ref 0.7–4.0)
LYMPHS PCT: 31 % (ref 12–46)
MCH: 28.8 pg (ref 26.0–34.0)
MCHC: 32.5 g/dL (ref 30.0–36.0)
MCV: 88.6 fL (ref 78.0–100.0)
MONO ABS: 0.6 10*3/uL (ref 0.1–1.0)
MONOS PCT: 7 % (ref 3–12)
Neutro Abs: 5.4 10*3/uL (ref 1.7–7.7)
Neutrophils Relative %: 59 % (ref 43–77)
Platelets: 276 10*3/uL (ref 150–400)
RBC: 4.3 MIL/uL (ref 3.87–5.11)
RDW: 14.1 % (ref 11.5–15.5)
WBC: 9 10*3/uL (ref 4.0–10.5)

## 2013-12-13 MED ORDER — IPRATROPIUM-ALBUTEROL 0.5-2.5 (3) MG/3ML IN SOLN
3.0000 mL | Freq: Once | RESPIRATORY_TRACT | Status: AC
  Administered 2013-12-14: 3 mL via RESPIRATORY_TRACT
  Filled 2013-12-13: qty 3

## 2013-12-13 MED ORDER — IPRATROPIUM BROMIDE 0.02 % IN SOLN
0.5000 mg | Freq: Once | RESPIRATORY_TRACT | Status: AC
  Administered 2013-12-13: 0.5 mg via RESPIRATORY_TRACT
  Filled 2013-12-13: qty 2.5

## 2013-12-13 MED ORDER — ALBUTEROL SULFATE (2.5 MG/3ML) 0.083% IN NEBU
5.0000 mg | INHALATION_SOLUTION | RESPIRATORY_TRACT | Status: DC | PRN
  Administered 2013-12-13: 5 mg via RESPIRATORY_TRACT
  Filled 2013-12-13: qty 6

## 2013-12-13 MED ORDER — PREDNISONE 20 MG PO TABS
60.0000 mg | ORAL_TABLET | Freq: Once | ORAL | Status: AC
  Administered 2013-12-14: 60 mg via ORAL
  Filled 2013-12-13: qty 3

## 2013-12-13 NOTE — ED Notes (Signed)
The pt has a cold and a hx of asthma ans she has hd ad more difficulty breathing for the past 4 days.  No temp.  hhns at her home not helping.  coughing

## 2013-12-13 NOTE — ED Notes (Signed)
Pt requested socks.  Brown socks were placed on pt.  Alert sitting in bed; lights down; no complaints

## 2013-12-13 NOTE — ED Notes (Signed)
Pt placed in bed. PT monitored by pulse ox, 12-Lead, and bp cuff.

## 2013-12-14 ENCOUNTER — Encounter (HOSPITAL_COMMUNITY): Payer: Self-pay | Admitting: General Practice

## 2013-12-14 DIAGNOSIS — I10 Essential (primary) hypertension: Secondary | ICD-10-CM

## 2013-12-14 DIAGNOSIS — J45901 Unspecified asthma with (acute) exacerbation: Secondary | ICD-10-CM | POA: Diagnosis present

## 2013-12-14 DIAGNOSIS — J45909 Unspecified asthma, uncomplicated: Secondary | ICD-10-CM | POA: Diagnosis present

## 2013-12-14 DIAGNOSIS — J40 Bronchitis, not specified as acute or chronic: Secondary | ICD-10-CM

## 2013-12-14 LAB — I-STAT CHEM 8, ED
BUN: 4 mg/dL — AB (ref 6–23)
CALCIUM ION: 1.22 mmol/L (ref 1.12–1.23)
CHLORIDE: 97 meq/L (ref 96–112)
CREATININE: 0.8 mg/dL (ref 0.50–1.10)
GLUCOSE: 160 mg/dL — AB (ref 70–99)
HCT: 41 % (ref 36.0–46.0)
Hemoglobin: 13.9 g/dL (ref 12.0–15.0)
Potassium: 3.5 mEq/L — ABNORMAL LOW (ref 3.7–5.3)
Sodium: 141 mEq/L (ref 137–147)
TCO2: 26 mmol/L (ref 0–100)

## 2013-12-14 LAB — GLUCOSE, CAPILLARY
GLUCOSE-CAPILLARY: 347 mg/dL — AB (ref 70–99)
Glucose-Capillary: 242 mg/dL — ABNORMAL HIGH (ref 70–99)
Glucose-Capillary: 332 mg/dL — ABNORMAL HIGH (ref 70–99)
Glucose-Capillary: 337 mg/dL — ABNORMAL HIGH (ref 70–99)

## 2013-12-14 LAB — CBC
HEMATOCRIT: 37.1 % (ref 36.0–46.0)
Hemoglobin: 11.8 g/dL — ABNORMAL LOW (ref 12.0–15.0)
MCH: 28.4 pg (ref 26.0–34.0)
MCHC: 31.8 g/dL (ref 30.0–36.0)
MCV: 89.4 fL (ref 78.0–100.0)
PLATELETS: 271 10*3/uL (ref 150–400)
RBC: 4.15 MIL/uL (ref 3.87–5.11)
RDW: 14.3 % (ref 11.5–15.5)
WBC: 9.7 10*3/uL (ref 4.0–10.5)

## 2013-12-14 LAB — BASIC METABOLIC PANEL
BUN: 6 mg/dL (ref 6–23)
CALCIUM: 9.5 mg/dL (ref 8.4–10.5)
CO2: 27 meq/L (ref 19–32)
Chloride: 99 mEq/L (ref 96–112)
Creatinine, Ser: 0.75 mg/dL (ref 0.50–1.10)
GFR calc Af Amer: >90 mL/min (ref >90)
Glucose, Bld: 168 mg/dL — ABNORMAL HIGH (ref 70–99)
Potassium: 3.9 mEq/L (ref 3.7–5.3)
Sodium: 141 mEq/L (ref 137–147)

## 2013-12-14 LAB — TROPONIN I
Troponin I: <0.3 ng/mL (ref <0.30)
Troponin I: <0.3 ng/mL (ref <0.30)
Troponin I: <0.3 ng/mL (ref <0.30)

## 2013-12-14 LAB — PRO B NATRIURETIC PEPTIDE: Pro B Natriuretic peptide (BNP): 18.3 pg/mL (ref 0–125)

## 2013-12-14 MED ORDER — ATORVASTATIN CALCIUM 10 MG PO TABS
10.0000 mg | ORAL_TABLET | Freq: Every day | ORAL | Status: DC
  Administered 2013-12-14: 10 mg via ORAL
  Filled 2013-12-14 (×2): qty 1

## 2013-12-14 MED ORDER — INSULIN ASPART 100 UNIT/ML ~~LOC~~ SOLN
0.0000 [IU] | Freq: Three times a day (TID) | SUBCUTANEOUS | Status: DC
  Administered 2013-12-14 (×2): 7 [IU] via SUBCUTANEOUS
  Administered 2013-12-14: 3 [IU] via SUBCUTANEOUS
  Administered 2013-12-15: 9 [IU] via SUBCUTANEOUS
  Administered 2013-12-15: 7 [IU] via SUBCUTANEOUS

## 2013-12-14 MED ORDER — ONDANSETRON HCL 4 MG/2ML IJ SOLN
4.0000 mg | Freq: Three times a day (TID) | INTRAMUSCULAR | Status: DC | PRN
  Administered 2013-12-14: 4 mg via INTRAVENOUS
  Filled 2013-12-14: qty 2

## 2013-12-14 MED ORDER — IPRATROPIUM-ALBUTEROL 0.5-2.5 (3) MG/3ML IN SOLN
3.0000 mL | Freq: Two times a day (BID) | RESPIRATORY_TRACT | Status: DC
  Administered 2013-12-15: 3 mL via RESPIRATORY_TRACT
  Filled 2013-12-14: qty 3

## 2013-12-14 MED ORDER — IPRATROPIUM-ALBUTEROL 0.5-2.5 (3) MG/3ML IN SOLN
3.0000 mL | RESPIRATORY_TRACT | Status: DC
  Administered 2013-12-14 (×5): 3 mL via RESPIRATORY_TRACT
  Filled 2013-12-14 (×5): qty 3

## 2013-12-14 MED ORDER — PANTOPRAZOLE SODIUM 40 MG PO TBEC
40.0000 mg | DELAYED_RELEASE_TABLET | Freq: Every day | ORAL | Status: DC
Start: 2013-12-14 — End: 2013-12-15
  Administered 2013-12-14 – 2013-12-15 (×2): 40 mg via ORAL
  Filled 2013-12-14 (×2): qty 1

## 2013-12-14 MED ORDER — ACETAMINOPHEN 325 MG PO TABS: 650.0000 mg | ORAL_TABLET | Freq: Four times a day (QID) | ORAL | Status: DC | PRN

## 2013-12-14 MED ORDER — IPRATROPIUM-ALBUTEROL 0.5-2.5 (3) MG/3ML IN SOLN: 3.0000 mL | RESPIRATORY_TRACT | Status: DC

## 2013-12-14 MED ORDER — ONDANSETRON HCL 4 MG PO TABS: 4.0000 mg | ORAL_TABLET | Freq: Four times a day (QID) | ORAL | Status: DC | PRN

## 2013-12-14 MED ORDER — LEVOFLOXACIN 750 MG PO TABS
750.0000 mg | ORAL_TABLET | Freq: Every day | ORAL | Status: DC
  Administered 2013-12-14: 750 mg via ORAL
  Filled 2013-12-14: qty 1

## 2013-12-14 MED ORDER — GABAPENTIN 600 MG PO TABS
600.0000 mg | ORAL_TABLET | Freq: Three times a day (TID) | ORAL | Status: DC
  Administered 2013-12-14 – 2013-12-15 (×4): 600 mg via ORAL
  Filled 2013-12-14 (×6): qty 1

## 2013-12-14 MED ORDER — ACETAMINOPHEN 650 MG RE SUPP: 650.0000 mg | Freq: Four times a day (QID) | RECTAL | Status: DC | PRN

## 2013-12-14 MED ORDER — METHYLPREDNISOLONE SODIUM SUCC 125 MG IJ SOLR
60.0000 mg | Freq: Four times a day (QID) | INTRAMUSCULAR | Status: DC
  Administered 2013-12-14 (×2): 60 mg via INTRAVENOUS
  Filled 2013-12-14 (×2): qty 0.96
  Filled 2013-12-14: qty 2
  Filled 2013-12-14 (×3): qty 0.96

## 2013-12-14 MED ORDER — ENOXAPARIN SODIUM 40 MG/0.4ML ~~LOC~~ SOLN: 40.0000 mg | SUBCUTANEOUS | Status: DC

## 2013-12-14 MED ORDER — METHYLPREDNISOLONE SODIUM SUCC 40 MG IJ SOLR
40.0000 mg | Freq: Three times a day (TID) | INTRAMUSCULAR | Status: DC
  Administered 2013-12-14 – 2013-12-15 (×3): 40 mg via INTRAVENOUS
  Filled 2013-12-14 (×6): qty 1

## 2013-12-14 MED ORDER — SODIUM CHLORIDE 0.9 % IJ SOLN
3.0000 mL | INTRAMUSCULAR | Status: DC | PRN
  Administered 2013-12-14: 3 mL via INTRAVENOUS

## 2013-12-14 MED ORDER — ALBUTEROL SULFATE (2.5 MG/3ML) 0.083% IN NEBU: 2.5000 mg | INHALATION_SOLUTION | RESPIRATORY_TRACT | Status: DC

## 2013-12-14 MED ORDER — ACETAMINOPHEN-CODEINE #3 300-30 MG PO TABS: 1.0000 | ORAL_TABLET | ORAL | Status: DC | PRN

## 2013-12-14 MED ORDER — PREDNISONE 20 MG PO TABS: ORAL_TABLET | ORAL | Status: DC

## 2013-12-14 MED ORDER — DULOXETINE HCL 60 MG PO CPEP
60.0000 mg | ORAL_CAPSULE | Freq: Every day | ORAL | Status: DC
  Administered 2013-12-14 – 2013-12-15 (×2): 60 mg via ORAL
  Filled 2013-12-14 (×2): qty 1

## 2013-12-14 MED ORDER — IPRATROPIUM BROMIDE 0.02 % IN SOLN: 0.5000 mg | RESPIRATORY_TRACT | Status: DC

## 2013-12-14 MED ORDER — DOCUSATE SODIUM 100 MG PO CAPS
100.0000 mg | ORAL_CAPSULE | Freq: Two times a day (BID) | ORAL | Status: DC
  Administered 2013-12-14 – 2013-12-15 (×3): 100 mg via ORAL
  Filled 2013-12-14 (×4): qty 1

## 2013-12-14 MED ORDER — MOMETASONE FURO-FORMOTEROL FUM 100-5 MCG/ACT IN AERO
2.0000 | INHALATION_SPRAY | Freq: Two times a day (BID) | RESPIRATORY_TRACT | Status: DC
  Administered 2013-12-14 – 2013-12-15 (×3): 2 via RESPIRATORY_TRACT
  Filled 2013-12-14: qty 8.8

## 2013-12-14 MED ORDER — SODIUM CHLORIDE 0.9 % IJ SOLN
3.0000 mL | Freq: Two times a day (BID) | INTRAMUSCULAR | Status: DC
  Administered 2013-12-14 – 2013-12-15 (×3): 3 mL via INTRAVENOUS

## 2013-12-14 MED ORDER — ENOXAPARIN SODIUM 80 MG/0.8ML ~~LOC~~ SOLN
75.0000 mg | SUBCUTANEOUS | Status: DC
  Administered 2013-12-14 – 2013-12-15 (×2): 75 mg via SUBCUTANEOUS
  Filled 2013-12-14 (×2): qty 0.8

## 2013-12-14 MED ORDER — ALBUTEROL SULFATE (2.5 MG/3ML) 0.083% IN NEBU: 5.0000 mg | INHALATION_SOLUTION | RESPIRATORY_TRACT | Status: DC

## 2013-12-14 MED ORDER — SERTRALINE HCL 100 MG PO TABS
100.0000 mg | ORAL_TABLET | Freq: Every day | ORAL | Status: DC
  Administered 2013-12-14 – 2013-12-15 (×2): 100 mg via ORAL
  Filled 2013-12-14 (×2): qty 1

## 2013-12-14 MED ORDER — GUAIFENESIN ER 600 MG PO TB12
600.0000 mg | ORAL_TABLET | Freq: Two times a day (BID) | ORAL | Status: DC
  Administered 2013-12-14 – 2013-12-15 (×3): 600 mg via ORAL
  Filled 2013-12-14 (×4): qty 1

## 2013-12-14 MED ORDER — SODIUM CHLORIDE 0.9 % IV SOLN: 250.0000 mL | INTRAVENOUS | Status: DC | PRN

## 2013-12-14 MED ORDER — ALBUTEROL SULFATE (2.5 MG/3ML) 0.083% IN NEBU: 2.5000 mg | INHALATION_SOLUTION | RESPIRATORY_TRACT | Status: DC | PRN

## 2013-12-14 MED ORDER — ALBUTEROL SULFATE HFA 108 (90 BASE) MCG/ACT IN AERS: 1.0000 | INHALATION_SPRAY | Freq: Four times a day (QID) | RESPIRATORY_TRACT | Status: DC | PRN

## 2013-12-14 MED ORDER — ONDANSETRON HCL 4 MG/2ML IJ SOLN: 4.0000 mg | Freq: Four times a day (QID) | INTRAMUSCULAR | Status: DC | PRN

## 2013-12-14 MED ORDER — ARIPIPRAZOLE 10 MG PO TABS
20.0000 mg | ORAL_TABLET | Freq: Every day | ORAL | Status: DC
  Administered 2013-12-14 – 2013-12-15 (×2): 20 mg via ORAL
  Filled 2013-12-14 (×2): qty 2

## 2013-12-14 MED ORDER — POTASSIUM CHLORIDE CRYS ER 20 MEQ PO TBCR
40.0000 meq | EXTENDED_RELEASE_TABLET | Freq: Once | ORAL | Status: AC
  Administered 2013-12-14: 40 meq via ORAL
  Filled 2013-12-14: qty 2

## 2013-12-14 MED ORDER — ALBUTEROL (5 MG/ML) CONTINUOUS INHALATION SOLN
10.0000 mg/h | INHALATION_SOLUTION | Freq: Once | RESPIRATORY_TRACT | Status: AC
  Administered 2013-12-14: 10 mg/h via RESPIRATORY_TRACT
  Filled 2013-12-14: qty 20

## 2013-12-14 NOTE — H&P (Addendum)
[facility] History and Physical  Beth Buddamela M Drapeau WUJ:[id number] DOB: 08/22/1965 DOA: 12/13/2013  Referring physician: ED Physician.   PCP: Jeanann LewandowskyJEGEDE, OLUGBEMIGA, MD   Chief Complaint: SOB.   HPI: Beth [name] is a 48 y.o. female with PMH significant for Asthma, prior smoker, Diabetes, HTN who presents complaining of SOB, productive cough, chest pain that started 4 days prior to admission. Her nephew was sick with similar symptoms. Se denies chills. She does relates chest pain specially after she cough. She has been using her inhales more frequently.    Review of Systems:  Negative, except as per HPI.   Past Medical History  Diagnosis Date  . Diabetes mellitus   . Hypertension   . Asthma   . Arthritis   . Coronary artery disease   . Hyperlipidemia   . Phlebitis   . Asthmatic bronchitis , chronic   . Shortness of breath 05/23/11    "exertion; laying down"  . Depression   . Anxiety    Past Surgical History  Procedure Laterality Date  . Hernia repair    . Umbilical hernia repair  05/2004  . Cholecystectomy    . Dilation and curettage of uterus    . Hysteroscopy w/d&c N/A 08/04/2013    Procedure: DILATATION AND CURETTAGE /HYSTEROSCOPY;  Surgeon: Adam PhenixJames G Arnold, MD;  Location: [facility];  Service: Gynecology;  Laterality: N/A;   Social History:  reports that she has quit smoking. Her smoking use included Cigarettes. She has a 4.75 pack-year smoking history. She has never used smokeless tobacco. She reports that she drinks alcohol. She reports that she uses illicit drugs (Cocaine, Marijuana, and "Crack" cocaine).  No Known Allergies  Family History  Problem Relation Age of Onset  . Depression Mother   . Hypertension Mother   . Diabetes Mother   . Hypertension Father   . Hyperlipidemia Father   . Depression Sister      Prior to Admission medications   Medication Sig Start Date End Date Taking? Authorizing Alekai Pocock  acetaminophen-codeine (TYLENOL #3) 300-30 MG per  tablet Take 1 tablet by mouth every 4 (four) hours as needed. 10/12/13  Yes Jeanann Lewandowskylugbemiga Jegede, MD  albuterol (PROVENTIL HFA;VENTOLIN HFA) 108 (90 BASE) MCG/ACT inhaler Inhale 2 puffs into the lungs every 4 (four) hours as needed for wheezing or shortness of breath. For shortness of breath 09/02/13  Yes Jeanann Lewandowskylugbemiga Jegede, MD  ARIPiprazole (ABILIFY) 20 MG tablet Take 1 tablet (20 mg total) by mouth daily. 09/16/13  Yes Jeanann Lewandowskylugbemiga Jegede, MD  atenolol (TENORMIN) 25 MG tablet Take 1 tablet (25 mg total) by mouth daily. 04/30/13  Yes Alison MurrayAlma M Devine, MD  atorvastatin (LIPITOR) 10 MG tablet Take 1 tablet (10 mg total) by mouth daily. 04/30/13  Yes Alison MurrayAlma M Devine, MD  DULoxetine (CYMBALTA) 60 MG capsule Take 1 capsule (60 mg total) by mouth daily. 05/26/13  Yes Jeanann Lewandowskylugbemiga Jegede, MD  Fluticasone-Salmeterol (ADVAIR) 250-50 MCG/DOSE AEPB Inhale 1 puff into the lungs every 12 (twelve) hours. For shortness of breath 09/02/13  Yes Jeanann Lewandowskylugbemiga Jegede, MD  gabapentin (NEURONTIN) 600 MG tablet Take 1 tablet (600 mg total) by mouth 3 (three) times daily. 04/30/13  Yes Alison MurrayAlma M Devine, MD  hydrochlorothiazide (HYDRODIURIL) 12.5 MG tablet Take 2 tablets (25 mg total) by mouth daily. 04/30/13  Yes Alison MurrayAlma M Devine, MD  loratadine (CLARITIN) 10 MG tablet Take 1 tablet (10 mg total) by mouth daily. 04/30/13  Yes Alison MurrayAlma M Devine, MD  meloxicam (MOBIC) 15 MG tablet Take 1 tablet (  15 mg total) by mouth daily as needed for pain. Take with food 04/30/13  Yes Alison MurrayAlma M Devine, MD  pantoprazole (PROTONIX) 40 MG tablet Take 1 tablet (40 mg total) by mouth daily. 04/30/13  Yes Alison MurrayAlma M Devine, MD  sertraline (ZOLOFT) 100 MG tablet Take 1 tablet (100 mg total) by mouth daily. 04/30/13  Yes Alison MurrayAlma M Devine, MD  albuterol (PROVENTIL HFA;VENTOLIN HFA) 108 (90 BASE) MCG/ACT inhaler Inhale 1-2 puffs into the lungs every 6 (six) hours as needed for wheezing or shortness of breath. 12/14/13   Enid SkeensJoshua M Zavitz, MD  predniSONE (DELTASONE) 20 MG tablet 2 tabs po daily x 4  days 12/14/13   Enid SkeensJoshua M Zavitz, MD   Physical Exam: Filed Vitals:   12/14/13 0245  BP: 113/77  Pulse: 97  Temp:   Resp: 18    BP 113/77  Pulse 97  Temp(Src) 98.1 F (36.7 C) (Other (Comment))  Resp 18  Ht 5\' 8"  (1.727 m)  Wt 149.687 kg (330 lb)  BMI 50.19 kg/m2  SpO2 100%  General:  Appears calm and comfortable Eyes: PERRL, normal lids, irises & conjunctiva ENT: grossly normal hearing, lips & tongue Neck: no LAD, masses or thyromegaly Cardiovascular: RRR, no m/r/g. No LE edema. Telemetry: SR, no arrhythmias  Respiratory ; bilateral ronchus and wheezing, . Normal respiratory effort. Abdomen: soft, ntnd Skin: vitiligo LE.  Musculoskeletal: grossly normal tone BUE/BLE Psychiatric: grossly normal mood and affect, speech fluent and appropriate Neurologic: grossly non-focal.          Labs on Admission:  Basic Metabolic Panel:  Recent Labs Lab 12/14/13 0200 12/14/13 0223  NA 141 141  K 3.9 3.5*  CL 99 97  CO2 27  --   GLUCOSE 168* 160*  BUN 6 4*  CREATININE 0.75 0.80  CALCIUM 9.5  --    Liver Function Tests: No results found for this basename: AST, ALT, ALKPHOS, BILITOT, PROT, ALBUMIN,  in the last 168 hours No results found for this basename: LIPASE, AMYLASE,  in the last 168 hours No results found for this basename: AMMONIA,  in the last 168 hours CBC:  Recent Labs Lab 12/13/13 2309 12/14/13 0223  WBC 9.0  --   NEUTROABS 5.4  --   HGB 12.4 13.9  HCT 38.1 41.0  MCV 88.6  --   PLT 276  --    Cardiac Enzymes:  Recent Labs Lab 12/13/13 2309  TROPONINI <0.30    BNP (last 3 results)  Recent Labs  12/13/13 2309  PROBNP 18.3   CBG: No results found for this basename: GLUCAP,  in the last 168 hours  Radiological Exams on Admission: Dg Chest 2 View  12/13/2013   CLINICAL DATA:  Shortness of breath. Cough and congestion for 4 days.  EXAM: CHEST  2 VIEW  COMPARISON:  08/17/2013  FINDINGS: Thoracic spondylosis. Stable mild cardiomegaly. Mild  prominence of upper zone pulmonary vasculature but no edema. No pleural effusion.  IMPRESSION: 1. Cardiomegaly with borderline pulmonary venous hypertension, but no overt edema. 2. Thoracic spondylosis   Electronically Signed   By: Herbie BaltimoreWalt  Liebkemann M.D.   On: 12/13/2013 21:38    EKG: Independently reviewed. Sinus rhythm.   Assessment/Plan Active Problems:   Depression   DM type 2 (diabetes mellitus, type 2)   HTN (hypertension)   Acute asthma exacerbation   Asthma exacerbation   1-Asthma exacerbation. Continue with Nebulizer treatments, IV solumedrol, Start oral levaquin.   2-Diabetes; SSI.   3-Depression; continue with current medications.  4-GERD; continue with protonix.  5-HTN; hold atenolol.  6-Hypokalemia; replete with 40 meq.   Code Status: Full Code.  Family Communication: care discussed with patient.  Disposition Plan: admit under observation, less than 2 nights.   Time spent: 75 minutes.   Hartley Barefoot A [facility] Pager [phone]  **Disclaimer: This note may have been dictated with voice recognition software. Similar sounding words can inadvertently be transcribed and this note may contain transcription errors which may not have been corrected upon publication of note.**

## 2013-12-14 NOTE — Progress Notes (Signed)
RT notified about pts. Elevated HR. Respiratory orders modified. No new orders received from on call NP, K. Schorr, at this time.

## 2013-12-14 NOTE — Progress Notes (Signed)
Utilization review completed.  

## 2013-12-14 NOTE — ED Notes (Signed)
Pt returned from bathroom. Pt monitored by 12-lead, bp cuff, and pulse ox.

## 2013-12-14 NOTE — Progress Notes (Signed)
[facility] PROGRESS NOTE  Beth [name] [id number] DOB: 09/18/1965 DOA: 12/13/2013 PCP: Beth LewandowskyJEGEDE, OLUGBEMIGA, MD  Assessment/Plan: 1. Acute asthmatic exacerbation -Patient presenting with clinical signs and symptoms of asthmatic exacerbation. I suspect this was precipitated by underlying viral infection. -Reporting improvement this morning, although still having some shortness of breath. Will continue one more day of IV steroids with nebulizer treatments. Continue inhaled steroids  2.  Probable viral infection -Patient remaining afebrile since presentation, nontoxic appearing, white count within normal range, we'll discontinue IV antimicrobial therapy for now.  3   Hyperglycemia.  -Patient received IV steroids, continue Accu-Cheks Q. Beth chest was less than coverage  4.  History of hypertension. -Blood pressure stable  Code Status: Full Code Family Communication:  Disposition Plan: Continue supportive care, steroids, Nebs, anticipate discharge n the next 24 hours  Antibiotics:  Levaquin  HPI/Subjective: Patient is a pleasant 48 year old female with a past medical history of asthma, presenting to the emergency department overnight with complaints of increased cough, shortness of breath, wheezing starting in 3-4 days ago. She reported having sick contacts from her nephew who was having an upper respiratory tract infection. Initial chest x-ray showed cardiomegaly with borderline pulmonary venous hypertension, no overt edema no evidence of pneumonia.   Objective: Filed Vitals:   12/14/13 0856  BP: 131/73  Pulse: 106  Temp: 98.3 F (36.8 C)  Resp: 18    Intake/Output Summary (Last 24 hours) at 12/14/13 1109 Last data filed at 12/14/13 1022  Gross per 24 hour  Intake    490 ml  Output    500 ml  Net    -10 ml   Filed Weights   12/13/13 2041 12/14/13 0428  Weight: 149.687 kg (330 lb) 146.875 kg (323 lb 12.8 oz)    Exam:   General:  Patient is awake, in no  acute distress  Cardiovascular: Regular rate and rhythm, normal S1S2  Respiratory: She has bibasilar expiratory wheezes, with bilateral rhonchi  Abdomen: Soft nontender nondistended  Musculoskeletal: no edema  Data Reviewed: Basic Metabolic Panel:  Recent Labs Lab 12/14/13 0200 12/14/13 0223  NA 141 141  K 3.9 3.5*  CL 99 97  CO2 27  --   GLUCOSE 168* 160*  BUN 6 4*  CREATININE 0.75 0.80  CALCIUM 9.5  --    Liver Function Tests: No results found for this basename: AST, ALT, ALKPHOS, BILITOT, PROT, ALBUMIN,  in the last 168 hours No results found for this basename: LIPASE, AMYLASE,  in the last 168 hours No results found for this basename: AMMONIA,  in the last 168 hours CBC:  Recent Labs Lab 12/13/13 2309 12/14/13 0223 12/14/13 0445  WBC 9.0  --  9.7  NEUTROABS 5.4  --   --   HGB 12.4 13.9 11.8*  HCT 38.1 41.0 37.1  MCV 88.6  --  89.4  PLT 276  --  271   Cardiac Enzymes:  Recent Labs Lab 12/13/13 2309 12/14/13 0445 12/14/13 0834  TROPONINI <0.30 <0.30 <0.30   BNP (last 3 results)  Recent Labs  12/13/13 2309  PROBNP 18.3   CBG:  Recent Labs Lab 12/14/13 0534  GLUCAP 242*    No results found for this or any previous visit (from the past 240 hour(s)).   Studies: Dg Chest 2 View  12/13/2013   CLINICAL DATA:  Shortness of breath. Cough and congestion for 4 days.  EXAM: CHEST  2 VIEW  COMPARISON:  08/17/2013  FINDINGS: Thoracic spondylosis. Stable mild cardiomegaly.  Mild prominence of upper zone pulmonary vasculature but no edema. No pleural effusion.  IMPRESSION: 1. Cardiomegaly with borderline pulmonary venous hypertension, but no overt edema. 2. Thoracic spondylosis   Electronically Signed   By: Beth BaltimoreWalt  [name] M.D.   On: 12/13/2013 21:38    Scheduled Meds: . ARIPiprazole  20 mg Oral Daily  . atorvastatin  10 mg Oral q1800  . docusate sodium  100 mg Oral BID  . DULoxetine  60 mg Oral Daily  . enoxaparin  75 mg Subcutaneous Q24H  .  gabapentin  600 mg Oral TID  . guaiFENesin  600 mg Oral BID  . insulin aspart  0-9 Units Subcutaneous TID WC  . ipratropium-albuterol  3 mL Nebulization Q4H  . levofloxacin  750 mg Oral Daily  . methylPREDNISolone (SOLU-MEDROL) injection  60 mg Intravenous Q6H  . mometasone-formoterol  2 puff Inhalation BID  . pantoprazole  40 mg Oral Daily  . sertraline  100 mg Oral Daily  . sodium chloride  3 mL Intravenous Q12H   Continuous Infusions:   Active Problems:   Depression   DM type 2 (diabetes mellitus, type 2)   HTN (hypertension)   Acute asthma exacerbation   Asthma exacerbation   Asthma    Time spent: 25 min    Jeralyn BennettZAMORA, Beth  [facility] Pager [phone]. If 7PM-7AM, please contact night-coverage at [email or web address], password [facility] 12/14/2013, 11:09 AM  LOS: 1 day

## 2013-12-14 NOTE — ED Provider Notes (Addendum)
CSN: [id number]     Arrival date & time 12/13/13  2020 History   First MD Initiated Contact with Patient 12/13/13 2300     Chief Complaint  Patient presents with  . Shortness of Breath     (Consider location/radiation/quality/duration/timing/severity/associated sxs/prior Treatment) HPI Comments: 48 year old female with depression, lipids, obesity, high blood pressure, diabetes presents with productive cough, wheezing and shortness of breath for the past 4 days. This feels similar to her asthma history. No current steroids or antibiotics. No sick contacts. No heart failure heart attack history. No recent surgeries or travel. Patient denies blood clot history, active cancer, recent major trauma or surgery, unilateral leg swelling/ pain, recent long travel, hemoptysis or oral contraceptives. Symptoms worse with coughing. No weight gain or orthopnea. No exertional symptoms.   Patient is a 48 y.o. female presenting with shortness of breath. The history is provided by the patient.  Shortness of Breath Associated symptoms: cough and wheezing   Associated symptoms: no abdominal pain, no chest pain, no fever, no headaches, no neck pain, no rash and no vomiting     Past Medical History  Diagnosis Date  . Diabetes mellitus   . Hypertension   . Asthma   . Arthritis   . Coronary artery disease   . Hyperlipidemia   . Phlebitis   . Asthmatic bronchitis , chronic   . Shortness of breath 05/23/11    "exertion; laying down"  . Depression   . Anxiety    Past Surgical History  Procedure Laterality Date  . Hernia repair    . Umbilical hernia repair  05/2004  . Cholecystectomy    . Dilation and curettage of uterus    . Hysteroscopy w/d&c N/A 08/04/2013    Procedure: DILATATION AND CURETTAGE /HYSTEROSCOPY;  Surgeon: Adam Phenix, MD;  Location: [facility];  Service: Gynecology;  Laterality: N/A;   Family History  Problem Relation Age of Onset  . Depression Mother   . Hypertension Mother   .  Diabetes Mother   . Hypertension Father   . Hyperlipidemia Father   . Depression Sister    History  Substance Use Topics  . Smoking status: Former Smoker -- 0.25 packs/day for 19 years    Types: Cigarettes  . Smokeless tobacco: Never Used  . Alcohol Use: Yes     Comment: "quit alcohol ~ 2005; only then drank occasionl wine cooler"   OB History   Grav Para Term Preterm Abortions TAB SAB Ect Mult Living   0 0 0 0 0 0 0 0 0 0      Review of Systems  Constitutional: Negative for fever and chills.  HENT: Negative for congestion.   Eyes: Negative for visual disturbance.  Respiratory: Positive for cough, shortness of breath and wheezing.   Cardiovascular: Negative for chest pain and leg swelling.  Gastrointestinal: Negative for vomiting and abdominal pain.  Genitourinary: Negative for dysuria and flank pain.  Musculoskeletal: Negative for back pain, neck pain and neck stiffness.  Skin: Negative for rash.  Neurological: Negative for light-headedness and headaches.      Allergies  Review of patient's allergies indicates no known allergies.  Home Medications   Prior to Admission medications   Medication Sig Start Date End Date Taking? Authorizing Akaya Proffit  acetaminophen-codeine (TYLENOL #3) 300-30 MG per tablet Take 1 tablet by mouth every 4 (four) hours as needed. 10/12/13  Yes Jeanann Lewandowsky, MD  albuterol (PROVENTIL HFA;VENTOLIN HFA) 108 (90 BASE) MCG/ACT inhaler Inhale 2 puffs into the lungs every  4 (four) hours as needed for wheezing or shortness of breath. For shortness of breath 09/02/13  Yes Jeanann Lewandowskylugbemiga Jegede, MD  ARIPiprazole (ABILIFY) 20 MG tablet Take 1 tablet (20 mg total) by mouth daily. 09/16/13  Yes Jeanann Lewandowskylugbemiga Jegede, MD  atenolol (TENORMIN) 25 MG tablet Take 1 tablet (25 mg total) by mouth daily. 04/30/13  Yes Alison MurrayAlma M Devine, MD  atorvastatin (LIPITOR) 10 MG tablet Take 1 tablet (10 mg total) by mouth daily. 04/30/13  Yes Alison MurrayAlma M Devine, MD  DULoxetine (CYMBALTA) 60 MG  capsule Take 1 capsule (60 mg total) by mouth daily. 05/26/13  Yes Jeanann Lewandowskylugbemiga Jegede, MD  Fluticasone-Salmeterol (ADVAIR) 250-50 MCG/DOSE AEPB Inhale 1 puff into the lungs every 12 (twelve) hours. For shortness of breath 09/02/13  Yes Jeanann Lewandowskylugbemiga Jegede, MD  gabapentin (NEURONTIN) 600 MG tablet Take 1 tablet (600 mg total) by mouth 3 (three) times daily. 04/30/13  Yes Alison MurrayAlma M Devine, MD  hydrochlorothiazide (HYDRODIURIL) 12.5 MG tablet Take 2 tablets (25 mg total) by mouth daily. 04/30/13  Yes Alison MurrayAlma M Devine, MD  loratadine (CLARITIN) 10 MG tablet Take 1 tablet (10 mg total) by mouth daily. 04/30/13  Yes Alison MurrayAlma M Devine, MD  meloxicam (MOBIC) 15 MG tablet Take 1 tablet (15 mg total) by mouth daily as needed for pain. Take with food 04/30/13  Yes Alison MurrayAlma M Devine, MD  pantoprazole (PROTONIX) 40 MG tablet Take 1 tablet (40 mg total) by mouth daily. 04/30/13  Yes Alison MurrayAlma M Devine, MD  sertraline (ZOLOFT) 100 MG tablet Take 1 tablet (100 mg total) by mouth daily. 04/30/13  Yes Alison MurrayAlma M Devine, MD  albuterol (PROVENTIL HFA;VENTOLIN HFA) 108 (90 BASE) MCG/ACT inhaler Inhale 1-2 puffs into the lungs every 6 (six) hours as needed for wheezing or shortness of breath. 12/14/13   Enid SkeensJoshua M Zavitz, MD  predniSONE (DELTASONE) 20 MG tablet 2 tabs po daily x 4 days 12/14/13   Enid SkeensJoshua M Zavitz, MD   BP 146/82  Pulse 86  Temp(Src) 98.1 F (36.7 C) (Other (Comment))  Resp 16  Ht 5\' 8"  (1.727 m)  Wt 330 lb (149.687 kg)  BMI 50.19 kg/m2  SpO2 97% Physical Exam  Nursing note and vitals reviewed. Constitutional: She is oriented to person, place, and time. She appears well-developed and well-nourished.  HENT:  Head: Normocephalic and atraumatic.  Eyes: Conjunctivae are normal. Right eye exhibits no discharge. Left eye exhibits no discharge.  Neck: Normal range of motion. Neck supple. No tracheal deviation present.  Cardiovascular: Normal rate and regular rhythm.   Pulmonary/Chest: Effort normal. She has wheezes (expiratory bilateral).   Abdominal: Soft. She exhibits no distension. There is no tenderness. There is no guarding.  Musculoskeletal: She exhibits no edema.  Neurological: She is alert and oriented to person, place, and time.  Skin: Skin is warm. No rash noted.  Psychiatric: She has a normal mood and affect.    ED Course  Procedures (including critical care time) Labs Review Labs Reviewed  I-STAT CHEM 8, ED - Abnormal; Notable for the following:    Potassium 3.5 (*)    BUN 4 (*)    Glucose, Bld 160 (*)    All other components within normal limits  TROPONIN I  CBC WITH DIFFERENTIAL  PRO B NATRIURETIC PEPTIDE  BASIC METABOLIC PANEL    Imaging Review Dg Chest 2 View  12/13/2013   CLINICAL DATA:  Shortness of breath. Cough and congestion for 4 days.  EXAM: CHEST  2 VIEW  COMPARISON:  08/17/2013  FINDINGS: Thoracic  spondylosis. Stable mild cardiomegaly. Mild prominence of upper zone pulmonary vasculature but no edema. No pleural effusion.  IMPRESSION: 1. Cardiomegaly with borderline pulmonary venous hypertension, but no overt edema. 2. Thoracic spondylosis   Electronically Signed   By: Herbie BaltimoreWalt  Liebkemann M.D.   On: 12/13/2013 21:38     EKG Interpretation   Date/Time:  Sunday December 13 2013 22:56:18 EDT Ventricular Rate:  79 PR Interval:  182 QRS Duration: 96 QT Interval:  404 QTC Calculation: 463 R Axis:   -34 Text Interpretation:  Sinus rhythm Left atrial enlargement Left axis  deviation Probable anterior infarct, age indeterminate Similar to previous  Confirmed by ZAVITZ  MD, JOSHUA ([id number]) on 12/13/2013 11:07:16 PM      MDM   Final diagnoses:  Bronchitis  Acute asthma exacerbation, mild intermittent    Clinically patient likely asthma exacerbation with bronchitis however with cardiac risk factors cardiac screening done. EKG reviewed similar previous and troponin negative. Followup closely with primary Dr. 2 nebs given in ER patient improved significantly. Patient not requiring oxygen in ER no  respiratory distress. Pt improved on recheck.   During discussion of follow up outpt wheezing worsened, patient states she just feels generally unwell and is not feel comfortable going home area cont neb ordered, istat ordered.   Discussed the case with [facility] hospitalist for observation for asthma exacerbation.  Results and differential diagnosis were discussed with the patient/parent/guardian. Close follow up outpatient was discussed, comfortable with the plan.   Medications  albuterol (PROVENTIL) (2.5 MG/3ML) 0.083% nebulizer solution 5 mg (5 mg Nebulization Given 12/13/13 2050)  albuterol (PROVENTIL) (2.5 MG/3ML) 0.083% nebulizer solution 5 mg (not administered)  ondansetron (ZOFRAN) injection 4 mg (not administered)  ipratropium (ATROVENT) nebulizer solution 0.5 mg (0.5 mg Nebulization Given 12/13/13 2050)  ipratropium-albuterol (DUONEB) 0.5-2.5 (3) MG/3ML nebulizer solution 3 mL (3 mLs Nebulization Given 12/14/13 0026)  predniSONE (DELTASONE) tablet 60 mg (60 mg Oral Given 12/14/13 0011)  albuterol (PROVENTIL,VENTOLIN) solution continuous neb (10 mg/hr Nebulization Given 12/14/13 0217)    Filed Vitals:   12/13/13 2345 12/14/13 0000 12/14/13 0015 12/14/13 0027  BP: 134/59 137/73 146/82   Pulse: 84 84 86   Temp:      TempSrc:      Resp: 18 12 16    Height:      Weight:      SpO2: 96% 98% 100% 97%        Enid SkeensJoshua M Zavitz, MD 12/14/13 [id number]  Enid SkeensJoshua M Zavitz, MD 12/14/13 [id number]

## 2013-12-14 NOTE — Progress Notes (Signed)
Pt. Tachycardic with HR sustaining in 130s for short period. BP now in 110s-120.  Text page sent via amion to K. Schorr, NP. Pt. Asymptomatic. BP 150/88. RN will continue to monitor pt. For changes in condition. Wall, Cheryll DessertKaren Cherrell

## 2013-12-14 NOTE — ED Notes (Signed)
Pt receiving Hour Neb at this time and tolerating well.

## 2013-12-14 NOTE — Discharge Instructions (Signed)
Followup closely with your Dr. or return for worsening symptoms.  If you were given medicines take as directed.  If you are on coumadin or contraceptives realize their levels and effectiveness is altered by many different medicines.  If you have any reaction (rash, tongues swelling, other) to the medicines stop taking and see a physician.   Please follow up as directed and return to the ER or see a physician for new or worsening symptoms.  Thank you. Filed Vitals:   12/13/13 2345 12/14/13 0000 12/14/13 0015 12/14/13 0027  BP: 134/59 137/73 146/82   Pulse: 84 84 86   Temp:      TempSrc:      Resp: 18 12 16    Height:      Weight:      SpO2: 96% 98% 100% 97%    Asthma, Acute Bronchospasm Acute bronchospasm caused by asthma is also referred to as an asthma attack. Bronchospasm means your air passages become narrowed. The narrowing is caused by inflammation and tightening of the muscles in the air tubes (bronchi) in your lungs. This can make it hard to breathe or cause you to wheeze and cough. CAUSES Possible triggers are:  Animal dander from the skin, hair, or feathers of animals.  Dust mites contained in house dust.  Cockroaches.  Pollen from trees or grass.  Mold.  Cigarette or tobacco smoke.  Air pollutants such as dust, household cleaners, hair sprays, aerosol sprays, paint fumes, strong chemicals, or strong odors.  Cold air or weather changes. Cold air may trigger inflammation. Winds increase molds and pollens in the air.  Strong emotions such as crying or laughing hard.  Stress.  Certain medicines such as aspirin or beta-blockers.  Sulfites in foods and drinks, such as dried fruits and wine.  Infections or inflammatory conditions, such as a flu, cold, or inflammation of the nasal membranes (rhinitis).  Gastroesophageal reflux disease (GERD). GERD is a condition where stomach acid backs up into your esophagus.  Exercise or strenuous activity. SIGNS AND SYMPTOMS    Wheezing.  Excessive coughing, particularly at night.  Chest tightness.  Shortness of breath. DIAGNOSIS  Your health care provider will ask you about your medical history and perform a physical exam. A chest X-ray or blood testing may be performed to look for other causes of your symptoms or other conditions that may have triggered your asthma attack. TREATMENT  Treatment is aimed at reducing inflammation and opening up the airways in your lungs. Most asthma attacks are treated with inhaled medicines. These include quick relief or rescue medicines (such as bronchodilators) and controller medicines (such as inhaled corticosteroids). These medicines are sometimes given through an inhaler or a nebulizer. Systemic steroid medicine taken by mouth or given through an IV tube also can be used to reduce the inflammation when an attack is moderate or severe. Antibiotic medicines are only used if a bacterial infection is present.  HOME CARE INSTRUCTIONS   Rest.  Drink plenty of liquids. This helps the mucus to remain thin and be easily coughed up. Only use caffeine in moderation and do not use alcohol until you have recovered from your illness.  Do not smoke. Avoid being exposed to secondhand smoke.  You play a critical role in keeping yourself in good health. Avoid exposure to things that cause you to wheeze or to have breathing problems.  Keep your medicines up-to-date and available. Carefully follow your health care provider's treatment plan.  Take your medicine exactly as prescribed.  When pollen or pollution is bad, keep windows closed and use an air conditioner or go to places with air conditioning.  Asthma requires careful medical care. See your health care provider for a follow-up as advised. If you are more than [redacted] weeks pregnant and you were prescribed any new medicines, let your obstetrician know about the visit and how you are doing. Follow up with your health care provider as  directed.  After you have recovered from your asthma attack, make an appointment with your outpatient doctor to talk about ways to reduce the likelihood of future attacks. If you do not have a doctor who manages your asthma, make an appointment with a primary care doctor to discuss your asthma. SEEK IMMEDIATE MEDICAL CARE IF:   You are getting worse.  You have trouble breathing. If severe, call your local emergency services (911 in the [place]  You develop chest pain or discomfort.  You are vomiting.  You are not able to keep fluids down.  You are coughing up yellow, green, brown, or bloody sputum.  You have a fever and your symptoms suddenly get worse.  You have trouble swallowing. MAKE SURE YOU:   Understand these instructions.  Will watch your condition.  Will get help right away if you are not doing well or get worse. Document Released: 09/26/2006 Document Revised: 06/16/2013 Document Reviewed: 12/17/2012 [facility] Patient Information 2015 [employer] [place]. This information is not intended to replace advice given to you by your health care provider. Make sure you discuss any questions you have with your health care provider.

## 2013-12-15 DIAGNOSIS — E139 Other specified diabetes mellitus without complications: Secondary | ICD-10-CM

## 2013-12-15 DIAGNOSIS — G589 Mononeuropathy, unspecified: Secondary | ICD-10-CM

## 2013-12-15 DIAGNOSIS — I10 Essential (primary) hypertension: Secondary | ICD-10-CM

## 2013-12-15 LAB — CBC
HCT: 38.2 % (ref 36.0–46.0)
HEMOGLOBIN: 12.2 g/dL (ref 12.0–15.0)
MCH: 28.4 pg (ref 26.0–34.0)
MCHC: 31.9 g/dL (ref 30.0–36.0)
MCV: 89 fL (ref 78.0–100.0)
PLATELETS: 312 10*3/uL (ref 150–400)
RBC: 4.29 MIL/uL (ref 3.87–5.11)
RDW: 14.3 % (ref 11.5–15.5)
WBC: 18.4 10*3/uL — ABNORMAL HIGH (ref 4.0–10.5)

## 2013-12-15 LAB — BASIC METABOLIC PANEL
BUN: 14 mg/dL (ref 6–23)
CHLORIDE: 97 meq/L (ref 96–112)
CO2: 23 meq/L (ref 19–32)
Calcium: 9.9 mg/dL (ref 8.4–10.5)
Creatinine, Ser: 0.74 mg/dL (ref 0.50–1.10)
GFR calc Af Amer: >90 mL/min (ref >90)
GFR calc non Af Amer: >90 mL/min (ref >90)
GLUCOSE: 355 mg/dL — AB (ref 70–99)
POTASSIUM: 5 meq/L (ref 3.7–5.3)
Sodium: 137 mEq/L (ref 137–147)

## 2013-12-15 LAB — GLUCOSE, CAPILLARY
GLUCOSE-CAPILLARY: 318 mg/dL — AB (ref 70–99)
Glucose-Capillary: 394 mg/dL — ABNORMAL HIGH (ref 70–99)

## 2013-12-15 LAB — MRSA PCR SCREENING: MRSA BY PCR: POSITIVE — AB

## 2013-12-15 MED ORDER — ALBUTEROL SULFATE HFA 108 (90 BASE) MCG/ACT IN AERS: 2.0000 | INHALATION_SPRAY | RESPIRATORY_TRACT | Status: DC | PRN

## 2013-12-15 MED ORDER — METFORMIN HCL 850 MG PO TABS
850.0000 mg | ORAL_TABLET | Freq: Once | ORAL | Status: AC
  Administered 2013-12-15: 850 mg via ORAL
  Filled 2013-12-15: qty 1

## 2013-12-15 MED ORDER — METFORMIN HCL 500 MG PO TABS: 500.0000 mg | ORAL_TABLET | Freq: Two times a day (BID) | ORAL | Status: DC

## 2013-12-15 MED ORDER — PREDNISONE (PAK) 10 MG PO TABS: ORAL_TABLET | ORAL | Status: DC

## 2013-12-15 MED ORDER — MUPIROCIN 2 % EX OINT
TOPICAL_OINTMENT | Freq: Two times a day (BID) | CUTANEOUS | Status: DC
  Administered 2013-12-15: 10:00:00 via NASAL
  Filled 2013-12-15: qty 22

## 2013-12-15 NOTE — Progress Notes (Signed)
Nutrition Brief Note  Patient identified on the Malnutrition Screening Tool (MST) Report  Wt Readings from Last 15 Encounters:  12/15/13 321 lb 8 oz (145.831 kg)  10/12/13 330 lb (149.687 kg)  09/14/13 331 lb 14.4 oz (150.549 kg)  09/03/13 327 lb 4.8 oz (148.462 kg)  08/12/13 321 lb (145.605 kg)  07/31/13 321 lb (145.605 kg)  07/30/13 322 lb 3.2 oz (146.149 kg)  07/22/13 323 lb 14.4 oz (146.92 kg)  07/14/13 326 lb (147.873 kg)  07/08/13 329 lb 6.4 oz (149.415 kg)  06/08/13 324 lb (146.965 kg)  04/30/13 326 lb (147.873 kg)  02/16/13 330 lb (149.687 kg)  01/28/13 330 lb 3.2 oz (149.778 kg)  01/26/13 328 lb (148.78 kg)    Body mass index is 48.9 kg/(m^2). Patient meets criteria for Obesity based on current BMI. Pt reported decreeased appetite and unintentional 7 lb weight loss in the past 3 months.   Current diet order is Heart Healthy, patient is consuming approximately 100% of meals at this time. Pt with elevated blood glucose and history of diabetes. RD briefly reviewed food sources of carbohydrate and how they affect blood sugar. Discussed foods to limit and recommended portion sizes. Encouraged whole grains, lean protein, and vegetables. Provided "Carbohydrate Counting for People with Diabetes". Pt denies any questions at this time.   Labs and medications reviewed.   No further nutrition interventions warranted at this time.  Ian Malkineanne Barnett RD, LDN Inpatient Clinical Dietitian Pager: [phone] After Hours Pager: [phone]

## 2013-12-15 NOTE — Plan of Care (Signed)
Problem: Consults Goal: Nutrition Consult-if indicated Outcome: Completed/Met Date Met:  12/15/13 Seen by Registered dietitian this am.

## 2013-12-15 NOTE — Discharge Summary (Signed)
Physician Discharge Summary  Beth [name]:[id number] DOB: [birth year] DOA: 12/13/2013  PCP: Jeanann Lewandowsky, MD  Admit date: 12/13/2013 Discharge date: 12/15/2013  Time spent: 35 minutes  Recommendations for Outpatient Follow-up:  1. Please follow up on her blood sugars, she was given IV steroids during this hospitalization that lead to hyperglycemia  Discharge Diagnoses:  Active Problems:   Depression   DM type 2 (diabetes mellitus, type 2)   HTN (hypertension)   Acute asthma exacerbation   Asthma exacerbation   Asthma   Discharge Condition: Stable  Diet recommendation: Carb modified  Filed Weights   12/13/13 2041 12/14/13 0428 12/15/13 0532  Weight: 149.687 kg (330 lb) 146.875 kg (323 lb 12.8 oz) 145.831 kg (321 lb 8 oz)    History of present illness:  Beth [name] is a 48 y.o. female with PMH significant for Asthma, prior smoker, Diabetes, HTN who presents complaining of SOB, productive cough, chest pain that started 4 days prior to admission. Her nephew was sick with similar symptoms. Se denies chills. She does relates chest pain specially after she cough. She has been using her inhales more frequently.    Hospital Course:  Patient is a pleasant 48 year old female with a past medical history of asthma, diabetes, hypertension who was admitted to the medicine service on 12/13/2013 presenting with complaints of increasing cough, shortness of breath, wheezing. Initial chest x-ray did not show acute infiltrate. She had reported having a sick contact at home, as it was suspected that underlying viral infection precipitated acute asthmatic exacerbation. She was started on IV steroids and scheduled nebs. Patient showed gradual improvement and by 12/15/2013 she reported feeling well enough to go home. Her hospitalization was complicated by the development of hyperglycemia likely related to steroid use. She was given sliding scale coverage and discharged on metformin 500 mg twice  a day. She was instructed to check her blood sugars 3 times daily and report these to her primary care physician.   Discharge Exam: Filed Vitals:   12/15/13 0532  BP: 138/84  Pulse: 105  Temp: 98.3 F (36.8 C)  Resp: 18    General: She is awake alert oriented, states feeling better, feels ready to go home Cardiovascular: Regular rate and rhythm normal S1-S2 no murmurs rubs or gallops Respiratory: Improved lung exam with decreased wheezing, normal respiratory effort, does not require supplemental oxygen Abdomen: Soft nontender nondistended  Discharge Instructions You were cared for by a hospitalist during your hospital stay. If you have any questions about your discharge medications or the care you received while you were in the hospital after you are discharged, you can call the unit and asked to speak with the hospitalist on call if the hospitalist that took care of you is not available. Once you are discharged, your primary care physician will handle any further medical issues. Please note that NO REFILLS for any discharge medications will be authorized once you are discharged, as it is imperative that you return to your primary care physician (or establish a relationship with a primary care physician if you do not have one) for your aftercare needs so that they can reassess your need for medications and monitor your lab values.  Discharge Instructions   Diet - low sodium heart healthy    Complete by:  As directed      Discharge instructions    Complete by:  As directed   Please check your blood sugars 3 times daily prior to each meal, record them on  a log book. If they remain above 180 please call your family physician     Increase activity slowly    Complete by:  As directed             Medication List         acetaminophen-codeine 300-30 MG per tablet  Commonly known as:  TYLENOL #3  Take 1 tablet by mouth every 4 (four) hours as needed.     albuterol 108 (90 BASE)  MCG/ACT inhaler  Commonly known as:  PROVENTIL HFA;VENTOLIN HFA  Inhale 2 puffs into the lungs every 4 (four) hours as needed for wheezing or shortness of breath. For shortness of breath     ARIPiprazole 20 MG tablet  Commonly known as:  ABILIFY  Take 1 tablet (20 mg total) by mouth daily.     atenolol 25 MG tablet  Commonly known as:  TENORMIN  Take 1 tablet (25 mg total) by mouth daily.     atorvastatin 10 MG tablet  Commonly known as:  LIPITOR  Take 1 tablet (10 mg total) by mouth daily.     DULoxetine 60 MG capsule  Commonly known as:  CYMBALTA  Take 1 capsule (60 mg total) by mouth daily.     Fluticasone-Salmeterol 250-50 MCG/DOSE Aepb  Commonly known as:  ADVAIR  Inhale 1 puff into the lungs every 12 (twelve) hours. For shortness of breath     gabapentin 600 MG tablet  Commonly known as:  NEURONTIN  Take 1 tablet (600 mg total) by mouth 3 (three) times daily.     hydrochlorothiazide 12.5 MG tablet  Commonly known as:  HYDRODIURIL  Take 2 tablets (25 mg total) by mouth daily.     loratadine 10 MG tablet  Commonly known as:  CLARITIN  Take 1 tablet (10 mg total) by mouth daily.     meloxicam 15 MG tablet  Commonly known as:  MOBIC  Take 1 tablet (15 mg total) by mouth daily as needed for pain. Take with food     metFORMIN 500 MG tablet  Commonly known as:  GLUCOPHAGE  Take 1 tablet (500 mg total) by mouth 2 (two) times daily with a meal.     pantoprazole 40 MG tablet  Commonly known as:  PROTONIX  Take 1 tablet (40 mg total) by mouth daily.     predniSONE 10 MG tablet  Commonly known as:  STERAPRED UNI-PAK  Take 6-5-4-3-2-1 tablets by mouth daily till gone.     sertraline 100 MG tablet  Commonly known as:  ZOLOFT  Take 1 tablet (100 mg total) by mouth daily.       No Known Allergies     Follow-up Information   Follow up with Jeanann LewandowskyJEGEDE, OLUGBEMIGA, MD. Call in 2 days.   Specialty:  Internal Medicine   Contact information:   [address] [place] [address] [phone]        The results of significant diagnostics from this hospitalization (including imaging, microbiology, ancillary and laboratory) are listed below for reference.    Significant Diagnostic Studies: Dg Chest 2 View  12/13/2013   CLINICAL DATA:  Shortness of breath. Cough and congestion for 4 days.  EXAM: CHEST  2 VIEW  COMPARISON:  08/17/2013  FINDINGS: Thoracic spondylosis. Stable mild cardiomegaly. Mild prominence of upper zone pulmonary vasculature but no edema. No pleural effusion.  IMPRESSION: 1. Cardiomegaly with borderline pulmonary venous hypertension, but no overt edema. 2. Thoracic spondylosis   Electronically Signed  By: Herbie BaltimoreWalt  Liebkemann M.D.   On: 12/13/2013 21:38    Microbiology: Recent Results (from the past 240 hour(s))  MRSA PCR SCREENING     Status: Abnormal   Collection Time    12/15/13  4:26 AM      Result Value Ref Range Status   MRSA by PCR POSITIVE (*) NEGATIVE Final   Comment:            The GeneXpert MRSA Assay (FDA     approved for NASAL specimens     only), is one component of a     comprehensive MRSA colonization     surveillance program. It is not     intended to diagnose MRSA     infection nor to guide or     monitor treatment for     MRSA infections.     RESULT CALLED TO, READ BACK BY AND VERIFIED WITH:     WALL,K RN [phone] AT 0612 SKEENP     Labs: Basic Metabolic Panel:  Recent Labs Lab 12/14/13 0200 12/14/13 0223 12/15/13 0333  NA 141 141 137  K 3.9 3.5* 5.0  CL 99 97 97  CO2 27  --  23  GLUCOSE 168* 160* 355*  BUN 6 4* 14  CREATININE 0.75 0.80 0.74  CALCIUM 9.5  --  9.9   Liver Function Tests: No results found for this basename: AST, ALT, ALKPHOS, BILITOT, PROT, ALBUMIN,  in the last 168 hours No results found for this basename: LIPASE, AMYLASE,  in the last 168 hours No results found for this basename: AMMONIA,  in the last 168 hours CBC:  Recent Labs Lab 12/13/13 2309 12/14/13 0223  12/14/13 0445 12/15/13 0333  WBC 9.0  --  9.7 18.4*  NEUTROABS 5.4  --   --   --   HGB 12.4 13.9 11.8* 12.2  HCT 38.1 41.0 37.1 38.2  MCV 88.6  --  89.4 89.0  PLT 276  --  271 312   Cardiac Enzymes:  Recent Labs Lab 12/13/13 2309 12/14/13 0445 12/14/13 0834 12/14/13 1549  TROPONINI <0.30 <0.30 <0.30 <0.30   BNP: BNP (last 3 results)  Recent Labs  12/13/13 2309  PROBNP 18.3   CBG:  Recent Labs Lab 12/14/13 0534 12/14/13 1204 12/14/13 1635 12/14/13 2202 12/15/13 0705  GLUCAP 242* 332* 337* 347* 318*       Signed:  Jeralyn BennettZAMORA, [name]  [facility] 12/15/2013, 9:32 AM

## 2013-12-15 NOTE — Care Management Note (Signed)
    Page 1 of 1   12/15/2013     1:56:09 PM CARE MANAGEMENT NOTE 12/15/2013  Patient:  Beth [name],Beth [name]   Account Number:  [id number]  Date Initiated:  12/15/2013  Documentation initiated by:  [facility],Oluwadamilola Deliz  Subjective/Objective Assessment:   48 y.o. female with PMH significant for Asthma, prior smoker, Diabetes, HTN who presents complaining of SOB, productive cough, chest pain that started 4 days prior to admission.//Home with sister and nephew.     Action/Plan:   Nebulizer treatments, IV solumedrol, Start oral levaquin.// [facility]; medication assistance.   Anticipated DC Date:  12/15/2013   Anticipated DC Plan:  HOME/SELF CARE      DC Planning Services  CM consult  Medication Assistance      Choice offered to / List presented to:             Status of service:  Completed, signed off Medicare Important Message given?   (If response is "NO", the following Medicare IM given date fields will be blank) Date Medicare IM given:   Date Additional Medicare IM given:    Discharge Disposition:    Per UR Regulation:    If discussed at Long Length of Stay Meetings, dates discussed:    Comments:  12/15/13 1150 [name], RN, BSN, [employer] [phone] Spoke with pt at bedside regarding medication assistance. NCM confirmed with [name], Financial Counselor that pt has [employer] 100% discount that helps with medication payment.  Pt stated that she thought her orange card had expired because she has an eligiblity appoint on Friday. NCM encouraged her to keep appointment and to use current orange card.  [name], RN notified prior to pt d/c.

## 2013-12-15 NOTE — Progress Notes (Signed)
All d/c instructions explained and given to pt.  Verbalized understanding.  D/c off floor via w/c by volunteer service.

## 2013-12-18 ENCOUNTER — Ambulatory Visit: Payer: No Typology Code available for payment source | Attending: Internal Medicine

## 2013-12-18 ENCOUNTER — Ambulatory Visit: Payer: No Typology Code available for payment source

## 2013-12-18 VITALS — BP 154/101 | HR 114 | Resp 20

## 2013-12-18 LAB — GLUCOSE, POCT (MANUAL RESULT ENTRY): POC Glucose: 181 mg/dl — AB (ref 70–99)

## 2013-12-18 LAB — POCT GLYCOSYLATED HEMOGLOBIN (HGB A1C): Hemoglobin A1C: 6.6

## 2013-12-18 MED ORDER — ALBUTEROL SULFATE (2.5 MG/3ML) 0.083% IN NEBU
2.5000 mg | INHALATION_SOLUTION | Freq: Once | RESPIRATORY_TRACT | Status: AC
  Administered 2013-12-18: 2.5 mg via RESPIRATORY_TRACT

## 2013-12-18 NOTE — Progress Notes (Unsigned)
Pt here for blood sugar recheck s/p hospitalization last week for Bronchitis. Pt prescribed steroids which are causing high cbg readings CBG- 181,running A1c  Denies freq urination,dizziness Tachycardic 114. Denies chest pain Sob noted. Last treatment x 2 dys ago Albuterol breathing treatment initiated

## 2013-12-28 ENCOUNTER — Encounter: Payer: Self-pay | Admitting: Internal Medicine

## 2013-12-28 ENCOUNTER — Ambulatory Visit: Payer: No Typology Code available for payment source | Attending: Internal Medicine | Admitting: Internal Medicine

## 2013-12-28 VITALS — BP 134/83 | HR 100 | Temp 98.0°F | Resp 16 | Ht 68.0 in | Wt 311.8 lb

## 2013-12-28 DIAGNOSIS — I10 Essential (primary) hypertension: Secondary | ICD-10-CM | POA: Insufficient documentation

## 2013-12-28 DIAGNOSIS — E119 Type 2 diabetes mellitus without complications: Secondary | ICD-10-CM | POA: Insufficient documentation

## 2013-12-28 DIAGNOSIS — Z1239 Encounter for other screening for malignant neoplasm of breast: Secondary | ICD-10-CM

## 2013-12-28 DIAGNOSIS — E1165 Type 2 diabetes mellitus with hyperglycemia: Secondary | ICD-10-CM

## 2013-12-28 DIAGNOSIS — E785 Hyperlipidemia, unspecified: Secondary | ICD-10-CM | POA: Insufficient documentation

## 2013-12-28 DIAGNOSIS — K219 Gastro-esophageal reflux disease without esophagitis: Secondary | ICD-10-CM | POA: Insufficient documentation

## 2013-12-28 LAB — POCT CBG (FASTING - GLUCOSE)-MANUAL ENTRY: Glucose Fasting, POC: 179 mg/dL — AB (ref 70–99)

## 2013-12-28 NOTE — Patient Instructions (Signed)
Exercise to Lose Weight Exercise and a healthy diet may help you lose weight. Your doctor may suggest specific exercises. EXERCISE IDEAS AND TIPS  Choose low-cost things you enjoy doing, such as walking, bicycling, or exercising to workout videos.  Take stairs instead of the elevator.  Walk during your lunch break.  Park your car further away from work or school.  Go to a gym or an exercise class.  Start with 5 to 10 minutes of exercise each day. Build up to 30 minutes of exercise 4 to 6 days a week.  Wear shoes with good support and comfortable clothes.  Stretch before and after working out.  Work out until you breathe harder and your heart beats faster.  Drink extra water when you exercise.  Do not do so much that you hurt yourself, feel dizzy, or get very short of breath. Exercises that burn about 150 calories:  Running 1  miles in 15 minutes.  Playing volleyball for 45 to 60 minutes.  Washing and waxing a car for 45 to 60 minutes.  Playing touch football for 45 minutes.  Walking 1  miles in 35 minutes.  Pushing a stroller 1  miles in 30 minutes.  Playing basketball for 30 minutes.  Raking leaves for 30 minutes.  Bicycling 5 miles in 30 minutes.  Walking 2 miles in 30 minutes.  Dancing for 30 minutes.  Shoveling snow for 15 minutes.  Swimming laps for 20 minutes.  Walking up stairs for 15 minutes.  Bicycling 4 miles in 15 minutes.  Gardening for 30 to 45 minutes.  Jumping rope for 15 minutes.  Washing windows or floors for 45 to 60 minutes. Document Released: 07/14/2010 Document Revised: 09/03/2011 Document Reviewed: 07/14/2010 ExitCare Patient Information 2015 [employer]. This information is not intended to replace advice given to you by your health care provider. Make sure you discuss any questions you have with your health care provider.  

## 2013-12-28 NOTE — Progress Notes (Signed)
Patient ID: Beth [name], female   DOB: 06/30/1965, 48 y.o.   MRN: [id number]  CC: DM  HPI:  Patient reports that she was admitted in ER a couple of weeks back for SOB and was given a course of IV steroids which increased her blood sugar dramatically.  Today she presents for a follow up visit.  Patient BS today is 179 and last hemoglobin A1c was 6.6.  Patient admits increased thirst, frequent urination, blurred vision.  Patient is currently taking Metformin 500 mg BID.  She has been checking her blood sugar daily with ranges 150-170's.   No Known Allergies Past Medical History  Diagnosis Date  . Hypertension   . Asthma   . Coronary artery disease   . Hyperlipidemia   . Phlebitis   . Asthmatic bronchitis , chronic   . Depression   . Anxiety   . Type II diabetes mellitus   . History of blood transfusion     "related to nose would not stop bleeding"  . GERD (gastroesophageal reflux disease)   . Arthritis     "knees" (12/14/2013)   Current Outpatient Prescriptions on File Prior to Visit  Medication Sig Dispense Refill  . acetaminophen-codeine (TYLENOL #3) 300-30 MG per tablet Take 1 tablet by mouth every 4 (four) hours as needed.  60 tablet  0  . albuterol (PROVENTIL HFA;VENTOLIN HFA) 108 (90 BASE) MCG/ACT inhaler Inhale 2 puffs into the lungs every 4 (four) hours as needed for wheezing or shortness of breath. For shortness of breath  3 Inhaler  3  . ARIPiprazole (ABILIFY) 20 MG tablet Take 1 tablet (20 mg total) by mouth daily.  90 tablet  3  . atenolol (TENORMIN) 25 MG tablet Take 1 tablet (25 mg total) by mouth daily.  30 tablet  5  . atorvastatin (LIPITOR) 10 MG tablet Take 1 tablet (10 mg total) by mouth daily.  30 tablet  5  . DULoxetine (CYMBALTA) 60 MG capsule Take 1 capsule (60 mg total) by mouth daily.  90 capsule  1 year  . Fluticasone-Salmeterol (ADVAIR) 250-50 MCG/DOSE AEPB Inhale 1 puff into the lungs every 12 (twelve) hours. For shortness of breath  3 each  3  . gabapentin  (NEURONTIN) 600 MG tablet Take 1 tablet (600 mg total) by mouth 3 (three) times daily.  90 tablet  5  . hydrochlorothiazide (HYDRODIURIL) 12.5 MG tablet Take 2 tablets (25 mg total) by mouth daily.  30 tablet  5  . loratadine (CLARITIN) 10 MG tablet Take 1 tablet (10 mg total) by mouth daily.  30 tablet  5  . meloxicam (MOBIC) 15 MG tablet Take 1 tablet (15 mg total) by mouth daily as needed for pain. Take with food  30 tablet  5  . metFORMIN (GLUCOPHAGE) 500 MG tablet Take 1 tablet (500 mg total) by mouth 2 (two) times daily with a meal.  30 tablet  0  . pantoprazole (PROTONIX) 40 MG tablet Take 1 tablet (40 mg total) by mouth daily.  30 tablet  5  . sertraline (ZOLOFT) 100 MG tablet Take 1 tablet (100 mg total) by mouth daily.  30 tablet  5  . predniSONE (STERAPRED UNI-PAK) 10 MG tablet Take 6-5-4-3-2-1 tablets by mouth daily till gone.  21 tablet  0  . [DISCONTINUED] buPROPion (WELLBUTRIN XL) 150 MG 24 hr tablet Take 150 mg by mouth daily.        . [DISCONTINUED] metoprolol succinate (TOPROL-XL) 100 MG 24 hr tablet Take 1  tablet (100 mg total) by mouth daily.  30 tablet  2  . [DISCONTINUED] pravastatin (PRAVACHOL) 40 MG tablet Take 40 mg by mouth daily.        . [DISCONTINUED] risperiDONE (RISPERDAL) 2 MG tablet Take 2 mg by mouth at bedtime.         No current facility-administered medications on file prior to visit.   Family History  Problem Relation Age of Onset  . Depression Mother   . Hypertension Mother   . Diabetes Mother   . Hypertension Father   . Hyperlipidemia Father   . Depression Sister    History   Social History  . Marital Status: Divorced    Spouse Name: N/A    Number of Children: N/A  . Years of Education: N/A   Occupational History  . Not on file.   Social History Main Topics  . Smoking status: Former Smoker -- 0.25 packs/day for 19 years    Types: Cigarettes  . Smokeless tobacco: Never Used  . Alcohol Use: Yes     Comment: "quit alcohol ~ 2005; only then  drank occasionl wine cooler"  . Drug Use: Yes    Special: Cocaine, Marijuana, "Crack" cocaine     Comment: "LAST DRUG USE WAS IN 2005"  . Sexual Activity: Not Currently     Comment: "quit smoking cigarettes ~2005"   Other Topics Concern  . Not on file   Social History Narrative  . No narrative on file   Review of Systems  Constitutional: Negative for fever and chills.  HENT: Negative.   Eyes: Positive for blurred vision. Negative for double vision.  Respiratory: Positive for cough and shortness of breath. Negative for wheezing.   Cardiovascular: Negative.   Genitourinary: Positive for frequency.  Endo/Heme/Allergies: Positive for polydipsia.     Objective:   Filed Vitals:   12/28/13 1003  BP: 134/83  Pulse: 100  Temp: 98 F (36.7 C)  Resp: 16   Physical Exam  Constitutional: She is oriented to person, place, and time.  Cardiovascular: Normal rate, normal heart sounds and intact distal pulses.   Pulmonary/Chest: Effort normal and breath sounds normal. She has no wheezes.  Abdominal: Soft. Bowel sounds are normal.  Neurological: She is alert and oriented to person, place, and time.     Lab Results  Component Value Date   WBC 18.4* 12/15/2013   HGB 12.2 12/15/2013   HCT 38.2 12/15/2013   MCV 89.0 12/15/2013   PLT 312 12/15/2013   Lab Results  Component Value Date   CREATININE 0.74 12/15/2013   BUN 14 12/15/2013   NA 137 12/15/2013   K 5.0 12/15/2013   CL 97 12/15/2013   CO2 23 12/15/2013    Lab Results  Component Value Date   HGBA1C 6.6 12/18/2013   Lipid Panel     Component Value Date/Time   CHOL 184 07/31/2013 1558   TRIG 120 07/31/2013 1558   HDL 32* 07/31/2013 1558   CHOLHDL 5.8 07/31/2013 1558   VLDL 24 07/31/2013 1558   LDLCALC 128* 07/31/2013 1558       Assessment and plan:   Beth [name] was seen today for follow-up and diabetes.  Diagnoses and associated orders for this visit:  Type 2 diabetes mellitus with hyperglycemia - Glucose (CBG),  Fasting - Ambulatory referral to Ophthalmology Continue current therapy. If blood sugar continues to stay elevated, may call clinic back.    Breast cancer screening - MM Digital Screening; Future   Return in about  3 months (around 03/30/2014) for DM/HTN.    Holland Commons, NP-C [facility] [phone] 12/28/2013, 10:16 AM

## 2013-12-28 NOTE — Progress Notes (Signed)
Patient presents for F/U on DM States she receives counseling for depression.

## 2013-12-29 ENCOUNTER — Ambulatory Visit (INDEPENDENT_AMBULATORY_CARE_PROVIDER_SITE_OTHER): Payer: No Typology Code available for payment source | Admitting: Home Health Services

## 2013-12-29 DIAGNOSIS — E119 Type 2 diabetes mellitus without complications: Secondary | ICD-10-CM

## 2013-12-29 NOTE — Progress Notes (Signed)
DIABETES Pt came in to have a retinal scan per diabetic care.   Image was taken and submitted to [facility]. Garg for reading.    Results will be available in 1-2 weeks.  Results will be given to PCP for review and to contact patient.  Krisy Dix  

## 2014-02-23 ENCOUNTER — Ambulatory Visit (INDEPENDENT_AMBULATORY_CARE_PROVIDER_SITE_OTHER): Payer: No Typology Code available for payment source | Admitting: Sports Medicine

## 2014-02-23 ENCOUNTER — Encounter: Payer: Self-pay | Admitting: Sports Medicine

## 2014-02-23 VITALS — BP 149/92 | Ht 68.0 in | Wt 320.0 lb

## 2014-02-23 DIAGNOSIS — M25561 Pain in right knee: Principal | ICD-10-CM

## 2014-02-23 DIAGNOSIS — M1711 Unilateral primary osteoarthritis, right knee: Secondary | ICD-10-CM

## 2014-02-23 DIAGNOSIS — M1710 Unilateral primary osteoarthritis, unspecified knee: Secondary | ICD-10-CM

## 2014-02-23 DIAGNOSIS — G8929 Other chronic pain: Secondary | ICD-10-CM

## 2014-02-23 DIAGNOSIS — M1712 Unilateral primary osteoarthritis, left knee: Secondary | ICD-10-CM

## 2014-02-23 DIAGNOSIS — M25569 Pain in unspecified knee: Secondary | ICD-10-CM

## 2014-02-23 MED ORDER — METHYLPREDNISOLONE ACETATE 40 MG/ML IJ SUSP
40.0000 mg | Freq: Once | INTRAMUSCULAR | Status: AC
  Administered 2014-02-23: 40 mg via INTRA_ARTICULAR

## 2014-02-23 NOTE — Progress Notes (Signed)
   Subjective:    Patient ID: Beth [name], female    DOB: 06-25-1966, 48 y.o.   MRN: [id number]  HPI Patient returns to the office today requesting repeat cortisone injections into each knee. She has a well-documented history of bilateral knee DJD. Each knee was injected back in February and the injections did provide her with good symptom relief, usually for about 3-4 months. She also takes Mobic as needed and this helps, currently taking this pretty much every day. She denies any recent trauma. Symptoms are worse with activity and improve some at rest. No knee swelling.  No fevers or redness.  She does complain of some chronic instability and requests a compression brace today.  She uses her cane most of the time.  Review of Systems As per HPI    Objective:   Physical Exam  Obese, no acute distress. Awake alert and oriented x3  Knee: shows range of motion from 0-120 bilaterally. 1+ patellofemoral crepitus. Nontender to palpation along medial and lateral joint lines.  No effusion noted.  No knee or skin redness over knee.  Good joint stability. Neurovascularly intact distally.  Appropriate mood and affect  Assessment and Plan:  Returning bilateral knee pain secondary to DJD Each of her knees were reinjected today with cortisone. Left knee was injected using an anterior lateral approach and the right knee was injected using an anterior medial approach. Patient tolerated this without difficulty. Continue on Mobic as needed. We can repeat these injections in 6 months if needed. Compression sleeve provided today to help with support. Followup when necessary.     Consent obtained and verified.  Time-out conducted.  Noted no overlying erythema, induration, or other signs of local infection.  Skin prepped in a sterile fashion.  Topical analgesic spray: Ethyl chloride.  Joint: right knee  Needle: 22g 1.5 inch  Completed without difficulty.  Meds: 3cc 1% xylocaine, 1cc ( ) depomedrol     Consent obtained and verified.  Time-out conducted.  Noted no overlying erythema, induration, or other signs of local infection.  Skin prepped in a sterile fashion.  Topical analgesic spray: Ethyl chloride.  Joint: left knee  Needle: 22g 1.5 inch  Completed without difficulty.  Meds: 3cc 1% xylocaine, 1cc ( ) depomedrol  Advised to call if fevers/chills, erythema, induration, drainage, or persistent bleeding

## 2014-02-25 ENCOUNTER — Ambulatory Visit: Payer: No Typology Code available for payment source | Attending: Internal Medicine

## 2014-03-05 ENCOUNTER — Other Ambulatory Visit: Payer: Self-pay | Admitting: Internal Medicine

## 2014-03-05 MED ORDER — DULOXETINE HCL 60 MG PO CPEP: 60.0000 mg | ORAL_CAPSULE | Freq: Every day | ORAL | Status: AC

## 2014-03-12 ENCOUNTER — Ambulatory Visit (HOSPITAL_COMMUNITY)
Admission: RE | Admit: 2014-03-12 | Discharge: 2014-03-12 | Disposition: A | Discharge status: Home / Self Care | Payer: No Typology Code available for payment source | Source: Ambulatory Visit | Attending: Internal Medicine | Admitting: Internal Medicine

## 2014-03-12 ENCOUNTER — Other Ambulatory Visit: Payer: Self-pay | Admitting: Internal Medicine

## 2014-03-12 DIAGNOSIS — Z1239 Encounter for other screening for malignant neoplasm of breast: Secondary | ICD-10-CM

## 2014-03-12 DIAGNOSIS — Z1231 Encounter for screening mammogram for malignant neoplasm of breast: Secondary | ICD-10-CM

## 2014-04-23 ENCOUNTER — Encounter: Payer: Self-pay | Admitting: Internal Medicine

## 2014-04-23 ENCOUNTER — Ambulatory Visit: Payer: No Typology Code available for payment source | Attending: Internal Medicine | Admitting: Internal Medicine

## 2014-04-23 VITALS — BP 133/85 | HR 87 | Temp 97.5°F | Resp 16

## 2014-04-23 DIAGNOSIS — R110 Nausea: Secondary | ICD-10-CM | POA: Insufficient documentation

## 2014-04-23 DIAGNOSIS — Z23 Encounter for immunization: Secondary | ICD-10-CM | POA: Insufficient documentation

## 2014-04-23 DIAGNOSIS — Z87891 Personal history of nicotine dependence: Secondary | ICD-10-CM | POA: Insufficient documentation

## 2014-04-23 DIAGNOSIS — J45909 Unspecified asthma, uncomplicated: Secondary | ICD-10-CM | POA: Insufficient documentation

## 2014-04-23 DIAGNOSIS — E119 Type 2 diabetes mellitus without complications: Secondary | ICD-10-CM | POA: Insufficient documentation

## 2014-04-23 DIAGNOSIS — I10 Essential (primary) hypertension: Secondary | ICD-10-CM | POA: Insufficient documentation

## 2014-04-23 DIAGNOSIS — K219 Gastro-esophageal reflux disease without esophagitis: Secondary | ICD-10-CM | POA: Insufficient documentation

## 2014-04-23 DIAGNOSIS — F12120 Cannabis abuse with intoxication, uncomplicated: Secondary | ICD-10-CM | POA: Insufficient documentation

## 2014-04-23 DIAGNOSIS — I2510 Atherosclerotic heart disease of native coronary artery without angina pectoris: Secondary | ICD-10-CM | POA: Insufficient documentation

## 2014-04-23 DIAGNOSIS — E785 Hyperlipidemia, unspecified: Secondary | ICD-10-CM | POA: Insufficient documentation

## 2014-04-23 DIAGNOSIS — F1420 Cocaine dependence, uncomplicated: Secondary | ICD-10-CM | POA: Insufficient documentation

## 2014-04-23 DIAGNOSIS — F419 Anxiety disorder, unspecified: Secondary | ICD-10-CM | POA: Insufficient documentation

## 2014-04-23 DIAGNOSIS — F329 Major depressive disorder, single episode, unspecified: Secondary | ICD-10-CM | POA: Insufficient documentation

## 2014-04-23 DIAGNOSIS — Z79899 Other long term (current) drug therapy: Secondary | ICD-10-CM | POA: Insufficient documentation

## 2014-04-23 DIAGNOSIS — R1032 Left lower quadrant pain: Secondary | ICD-10-CM

## 2014-04-23 LAB — POCT URINALYSIS DIPSTICK
Blood, UA: NEGATIVE
Glucose, UA: NEGATIVE
KETONES UA: NEGATIVE
LEUKOCYTES UA: NEGATIVE
NITRITE UA: NEGATIVE
PH UA: 6.5
Protein, UA: 100
Spec Grav, UA: 1.015
Urobilinogen, UA: 1

## 2014-04-23 LAB — GLUCOSE, POCT (MANUAL RESULT ENTRY): POC GLUCOSE: 127 mg/dL — AB (ref 70–99)

## 2014-04-23 LAB — POCT GLYCOSYLATED HEMOGLOBIN (HGB A1C): Hemoglobin A1C: 5.5

## 2014-04-23 NOTE — Progress Notes (Signed)
Patient ID: Beth [name], female   DOB: 06/02/1966, 48 y.o.   MRN: [id number]  CC: follow up  HPI: Left upper quadrant pain for the past two weeks that is causing nausea.  She states that the pain is preventing her from eating.  She reports severe pain.  The pain occurs mostly at night.  Has not been taking protonix due to cost.  Pain is burning in nature.   Patient been having right sided facial pain and headaches that usually last all day.  Photophobia and nausea.  No history of migraines.  Has been taking sinus medication OTC.   Denies all cold symptoms She states that she never took the metformin because she thought she did not have too, Her a1c has improved greatly.       No Known Allergies Past Medical History  Diagnosis Date  . Hypertension   . Asthma   . Coronary artery disease   . Hyperlipidemia   . Phlebitis   . Asthmatic bronchitis , chronic   . Depression   . Anxiety   . Type II diabetes mellitus   . History of blood transfusion     "related to nose would not stop bleeding"  . GERD (gastroesophageal reflux disease)   . Arthritis     "knees" (12/14/2013)   Current Outpatient Prescriptions on File Prior to Visit  Medication Sig Dispense Refill  . albuterol (PROVENTIL HFA;VENTOLIN HFA) 108 (90 BASE) MCG/ACT inhaler Inhale 2 puffs into the lungs every 4 (four) hours as needed for wheezing or shortness of breath. For shortness of breath  3 Inhaler  3  . ARIPiprazole (ABILIFY) 20 MG tablet Take 1 tablet (20 mg total) by mouth daily.  90 tablet  3  . atenolol (TENORMIN) 25 MG tablet Take 1 tablet (25 mg total) by mouth daily.  30 tablet  5  . atorvastatin (LIPITOR) 10 MG tablet Take 1 tablet (10 mg total) by mouth daily.  30 tablet  5  . DULoxetine (CYMBALTA) 60 MG capsule Take 1 capsule (60 mg total) by mouth daily.  90 capsule  3  . Fluticasone-Salmeterol (ADVAIR) 250-50 MCG/DOSE AEPB Inhale 1 puff into the lungs every 12 (twelve) hours. For shortness of breath  3 each  3  .  gabapentin (NEURONTIN) 600 MG tablet Take 1 tablet (600 mg total) by mouth 3 (three) times daily.  90 tablet  5  . hydrochlorothiazide (HYDRODIURIL) 12.5 MG tablet Take 2 tablets (25 mg total) by mouth daily.  30 tablet  5  . loratadine (CLARITIN) 10 MG tablet Take 1 tablet (10 mg total) by mouth daily.  30 tablet  5  . meloxicam (MOBIC) 15 MG tablet Take 1 tablet (15 mg total) by mouth daily as needed for pain. Take with food  30 tablet  5  . pantoprazole (PROTONIX) 40 MG tablet Take 1 tablet (40 mg total) by mouth daily.  30 tablet  5  . sertraline (ZOLOFT) 100 MG tablet Take 1 tablet (100 mg total) by mouth daily.  30 tablet  5  . acetaminophen-codeine (TYLENOL #3) 300-30 MG per tablet Take 1 tablet by mouth every 4 (four) hours as needed.  60 tablet  0  . metFORMIN (GLUCOPHAGE) 500 MG tablet Take 1 tablet (500 mg total) by mouth 2 (two) times daily with a meal.  30 tablet  0  . predniSONE (STERAPRED UNI-PAK) 10 MG tablet Take 6-5-4-3-2-1 tablets by mouth daily till gone.  21 tablet  0  . [DISCONTINUED]  buPROPion (WELLBUTRIN XL) 150 MG 24 hr tablet Take 150 mg by mouth daily.        . [DISCONTINUED] metoprolol succinate (TOPROL-XL) 100 MG 24 hr tablet Take 1 tablet (100 mg total) by mouth daily.  30 tablet  2  . [DISCONTINUED] pravastatin (PRAVACHOL) 40 MG tablet Take 40 mg by mouth daily.        . [DISCONTINUED] risperiDONE (RISPERDAL) 2 MG tablet Take 2 mg by mouth at bedtime.         No current facility-administered medications on file prior to visit.   Family History  Problem Relation Age of Onset  . Depression Mother   . Hypertension Mother   . Diabetes Mother   . Hypertension Father   . Hyperlipidemia Father   . Depression Sister    History   Social History  . Marital Status: Divorced    Spouse Name: N/A    Number of Children: N/A  . Years of Education: N/A   Occupational History  . Not on file.   Social History Main Topics  . Smoking status: Former Smoker -- 0.25  packs/day for 19 years    Types: Cigarettes  . Smokeless tobacco: Never Used  . Alcohol Use: Yes     Comment: "quit alcohol ~ 2005; only then drank occasionl wine cooler"  . Drug Use: Yes    Special: Cocaine, Marijuana, "Crack" cocaine     Comment: "LAST DRUG USE WAS IN 2005"  . Sexual Activity: Not Currently     Comment: "quit smoking cigarettes ~2005"   Other Topics Concern  . Not on file   Social History Narrative  . No narrative on file    Review of Systems: See HPI    Objective:   Filed Vitals:   04/23/14 1226  BP: 133/85  Pulse: 87  Temp: 97.5 F (36.4 C)  Resp: 16    Physical Exam: Constitutional: Patient appears well-developed and well-nourished. No distress. HENT: Normocephalic, atraumatic, External right and left ear normal. Oropharynx is clear and moist.  Eyes: Conjunctivae and EOM are normal. PERRLA, no scleral icterus. Neck: Normal ROM. Neck supple. No JVD. No tracheal deviation. No thyromegaly. CVS: RRR, S1/S2 +, no murmurs, no gallops, no carotid bruit.  Pulmonary: Effort and breath sounds normal, no stridor, rhonchi, wheezes, rales.  Abdominal: Soft. BS +,  no distension, tenderness, rebound or guarding.  Musculoskeletal: Normal range of motion. No edema and no tenderness.  Lymphadenopathy: No lymphadenopathy noted, cervical Neuro: Alert. Normal reflexes, muscle tone coordination. No cranial nerve deficit. Skin: Skin is warm and dry. No rash noted. Not diaphoretic. No erythema. No pallor. Psychiatric: Normal mood and affect. Behavior, judgment, thought content normal.  Lab Results  Component Value Date   WBC 18.4* 12/15/2013   HGB 12.2 12/15/2013   HCT 38.2 12/15/2013   MCV 89.0 12/15/2013   PLT 312 12/15/2013   Lab Results  Component Value Date   CREATININE 0.74 12/15/2013   BUN 14 12/15/2013   NA 137 12/15/2013   K 5.0 12/15/2013   CL 97 12/15/2013   CO2 23 12/15/2013    Lab Results  Component Value Date   HGBA1C 5.5 04/23/2014   Lipid Panel      Component Value Date/Time   CHOL 184 07/31/2013 1558   TRIG 120 07/31/2013 1558   HDL 32* 07/31/2013 1558   CHOLHDL 5.8 07/31/2013 1558   VLDL 24 07/31/2013 1558   LDLCALC 128* 07/31/2013 1558       Assessment and  plan:   Beth [name] was seen today for follow-up, hypertension, diabetes and abdominal pain.  Diagnoses and associated orders for this visit:  Type 2 diabetes mellitus without complication - HgB A1c - Glucose (CBG) Will continue to monitor overtime to determine need for Metformin  Left lower quadrant pain - Urinalysis Dipstick  Encounter for immunization - Pneumococcal polysaccharide vaccine 23-valent greater than or equal to 2yo subcutaneous/IM   Return in about 3 months (around 07/24/2014) for Hypertension.        Holland CommonsKECK, VALERIE, NP-C [facility] [phone] 04/23/2014, 1:31 PM

## 2014-04-23 NOTE — Progress Notes (Signed)
Pt here to f/u with abdominal pain with right facial headache  States she hasn't taken BP meds x 2 weeks to due to income BP-133 84 78 Sob noted at rest. Denies chest pain Flu/PNA vaccine given CBG-127

## 2014-05-02 ENCOUNTER — Emergency Department (HOSPITAL_COMMUNITY): Payer: No Typology Code available for payment source

## 2014-05-02 ENCOUNTER — Encounter (HOSPITAL_COMMUNITY): Payer: Self-pay | Admitting: Emergency Medicine

## 2014-05-02 ENCOUNTER — Emergency Department (HOSPITAL_COMMUNITY)
Admission: EM | Admit: 2014-05-02 | Discharge: 2014-05-02 | Disposition: A | Discharge status: Home / Self Care | Payer: No Typology Code available for payment source | Attending: Emergency Medicine | Admitting: Emergency Medicine

## 2014-05-02 DIAGNOSIS — Z79899 Other long term (current) drug therapy: Secondary | ICD-10-CM | POA: Insufficient documentation

## 2014-05-02 DIAGNOSIS — Z87891 Personal history of nicotine dependence: Secondary | ICD-10-CM | POA: Insufficient documentation

## 2014-05-02 DIAGNOSIS — K219 Gastro-esophageal reflux disease without esophagitis: Secondary | ICD-10-CM | POA: Insufficient documentation

## 2014-05-02 DIAGNOSIS — E119 Type 2 diabetes mellitus without complications: Secondary | ICD-10-CM | POA: Insufficient documentation

## 2014-05-02 DIAGNOSIS — Z3202 Encounter for pregnancy test, result negative: Secondary | ICD-10-CM | POA: Insufficient documentation

## 2014-05-02 DIAGNOSIS — I10 Essential (primary) hypertension: Secondary | ICD-10-CM | POA: Insufficient documentation

## 2014-05-02 DIAGNOSIS — R7989 Other specified abnormal findings of blood chemistry: Secondary | ICD-10-CM

## 2014-05-02 DIAGNOSIS — R112 Nausea with vomiting, unspecified: Secondary | ICD-10-CM

## 2014-05-02 DIAGNOSIS — R1012 Left upper quadrant pain: Secondary | ICD-10-CM | POA: Insufficient documentation

## 2014-05-02 DIAGNOSIS — R160 Hepatomegaly, not elsewhere classified: Secondary | ICD-10-CM

## 2014-05-02 DIAGNOSIS — R945 Abnormal results of liver function studies: Secondary | ICD-10-CM

## 2014-05-02 DIAGNOSIS — R109 Unspecified abdominal pain: Secondary | ICD-10-CM

## 2014-05-02 DIAGNOSIS — I2510 Atherosclerotic heart disease of native coronary artery without angina pectoris: Secondary | ICD-10-CM | POA: Insufficient documentation

## 2014-05-02 DIAGNOSIS — R42 Dizziness and giddiness: Secondary | ICD-10-CM | POA: Insufficient documentation

## 2014-05-02 DIAGNOSIS — M1990 Unspecified osteoarthritis, unspecified site: Secondary | ICD-10-CM | POA: Insufficient documentation

## 2014-05-02 DIAGNOSIS — E785 Hyperlipidemia, unspecified: Secondary | ICD-10-CM | POA: Insufficient documentation

## 2014-05-02 DIAGNOSIS — Z9889 Other specified postprocedural states: Secondary | ICD-10-CM | POA: Insufficient documentation

## 2014-05-02 DIAGNOSIS — Z7952 Long term (current) use of systemic steroids: Secondary | ICD-10-CM | POA: Insufficient documentation

## 2014-05-02 DIAGNOSIS — Z7951 Long term (current) use of inhaled steroids: Secondary | ICD-10-CM | POA: Insufficient documentation

## 2014-05-02 DIAGNOSIS — Z9089 Acquired absence of other organs: Secondary | ICD-10-CM | POA: Insufficient documentation

## 2014-05-02 DIAGNOSIS — J45909 Unspecified asthma, uncomplicated: Secondary | ICD-10-CM | POA: Insufficient documentation

## 2014-05-02 LAB — URINALYSIS, ROUTINE W REFLEX MICROSCOPIC
BILIRUBIN URINE: NEGATIVE
Glucose, UA: NEGATIVE mg/dL
HGB URINE DIPSTICK: NEGATIVE
Ketones, ur: NEGATIVE mg/dL
Leukocytes, UA: NEGATIVE
Nitrite: NEGATIVE
PH: 6 (ref 5.0–8.0)
Protein, ur: NEGATIVE mg/dL
SPECIFIC GRAVITY, URINE: 1.014 (ref 1.005–1.030)
Urobilinogen, UA: 0.2 mg/dL (ref 0.0–1.0)

## 2014-05-02 LAB — CBC WITH DIFFERENTIAL/PLATELET
Basophils Absolute: 0 10*3/uL (ref 0.0–0.1)
Basophils Relative: 0 % (ref 0–1)
EOS ABS: 0.2 10*3/uL (ref 0.0–0.7)
EOS PCT: 2 % (ref 0–5)
HEMATOCRIT: 41.9 % (ref 36.0–46.0)
Hemoglobin: 13.7 g/dL (ref 12.0–15.0)
LYMPHS ABS: 2.4 10*3/uL (ref 0.7–4.0)
LYMPHS PCT: 26 % (ref 12–46)
MCH: 28.1 pg (ref 26.0–34.0)
MCHC: 32.7 g/dL (ref 30.0–36.0)
MCV: 85.9 fL (ref 78.0–100.0)
MONO ABS: 0.4 10*3/uL (ref 0.1–1.0)
Monocytes Relative: 5 % (ref 3–12)
Neutro Abs: 6.1 10*3/uL (ref 1.7–7.7)
Neutrophils Relative %: 67 % (ref 43–77)
Platelets: 307 10*3/uL (ref 150–400)
RBC: 4.88 MIL/uL (ref 3.87–5.11)
RDW: 14.7 % (ref 11.5–15.5)
WBC: 9.1 10*3/uL (ref 4.0–10.5)

## 2014-05-02 LAB — COMPREHENSIVE METABOLIC PANEL
ALBUMIN: 3.3 g/dL — AB (ref 3.5–5.2)
ALT: 56 U/L — AB (ref 0–35)
AST: 58 U/L — AB (ref 0–37)
Alkaline Phosphatase: 92 U/L (ref 39–117)
Anion gap: 14 (ref 5–15)
BUN: 7 mg/dL (ref 6–23)
CO2: 25 meq/L (ref 19–32)
CREATININE: 0.69 mg/dL (ref 0.50–1.10)
Calcium: 9.5 mg/dL (ref 8.4–10.5)
Chloride: 101 mEq/L (ref 96–112)
GFR calc Af Amer: >90 mL/min (ref >90)
Glucose, Bld: 151 mg/dL — ABNORMAL HIGH (ref 70–99)
Potassium: 3.6 mEq/L — ABNORMAL LOW (ref 3.7–5.3)
SODIUM: 140 meq/L (ref 137–147)
Total Bilirubin: 0.3 mg/dL (ref 0.3–1.2)
Total Protein: 8.3 g/dL (ref 6.0–8.3)

## 2014-05-02 LAB — LIPASE, BLOOD: Lipase: 18 U/L (ref 11–59)

## 2014-05-02 LAB — POC URINE PREG, ED: Preg Test, Ur: NEGATIVE

## 2014-05-02 MED ORDER — POTASSIUM CHLORIDE CRYS ER 20 MEQ PO TBCR
40.0000 meq | EXTENDED_RELEASE_TABLET | Freq: Once | ORAL | Status: AC
  Administered 2014-05-02: 40 meq via ORAL
  Filled 2014-05-02: qty 2

## 2014-05-02 MED ORDER — SODIUM CHLORIDE 0.9 % IV BOLUS (SEPSIS)
500.0000 mL | Freq: Once | INTRAVENOUS | Status: AC
  Administered 2014-05-02: 500 mL via INTRAVENOUS

## 2014-05-02 MED ORDER — PROMETHAZINE HCL 25 MG PO TABS: 25.0000 mg | ORAL_TABLET | Freq: Four times a day (QID) | ORAL | Status: DC | PRN

## 2014-05-02 MED ORDER — HYDROCODONE-ACETAMINOPHEN 5-325 MG PO TABS: 1.0000 | ORAL_TABLET | ORAL | Status: DC | PRN

## 2014-05-02 MED ORDER — MORPHINE SULFATE 4 MG/ML IJ SOLN
4.0000 mg | Freq: Once | INTRAMUSCULAR | Status: AC
  Administered 2014-05-02: 4 mg via INTRAVENOUS
  Filled 2014-05-02: qty 1

## 2014-05-02 MED ORDER — ONDANSETRON HCL 4 MG/2ML IJ SOLN
4.0000 mg | Freq: Once | INTRAMUSCULAR | Status: AC
  Administered 2014-05-02: 4 mg via INTRAVENOUS
  Filled 2014-05-02: qty 2

## 2014-05-02 NOTE — ED Provider Notes (Signed)
CSN: [id number]     Arrival date & time 05/02/14  1329 History   First MD Initiated Contact with Patient 05/02/14 1349     Chief Complaint  Patient presents with  . Flank Pain  . Emesis  . Dizziness     (Consider location/radiation/quality/duration/timing/severity/associated sxs/prior Treatment) HPI   Pt presents with left sided abdominal pain x several weeks, described as pressure, gradually worsening, now 8/10 intensity.  Overnight developed N/V.  Emesis is contents of her stomach.  Has had increased urination, frequency, and urgency x several days.  Also became dizzy "swimmyheaded" overnight.  Was seen at PCP several days ago with negative urine dipstick.   Denies fevers, chills, CP, SOB, cough, change in bowel habits, hematochezia, abnormal vaginal discharge or bleeding.   No personal hx kidney stones but she does have family hx kidney stones.   Past Medical History  Diagnosis Date  . Hypertension   . Asthma   . Coronary artery disease   . Hyperlipidemia   . Phlebitis   . Asthmatic bronchitis , chronic   . Depression   . Anxiety   . Type II diabetes mellitus   . History of blood transfusion     "related to nose would not stop bleeding"  . GERD (gastroesophageal reflux disease)   . Arthritis     "knees" (12/14/2013)   Past Surgical History  Procedure Laterality Date  . Hernia repair    . Umbilical hernia repair  05/2004  . Cholecystectomy    . Dilation and curettage of uterus    . Hysteroscopy w/d&c N/A 08/04/2013    Procedure: DILATATION AND CURETTAGE /HYSTEROSCOPY;  Surgeon: Adam PhenixJames G Arnold, MD;  Location: [facility];  Service: Gynecology;  Laterality: N/A;   Family History  Problem Relation Age of Onset  . Depression Mother   . Hypertension Mother   . Diabetes Mother   . Hypertension Father   . Hyperlipidemia Father   . Depression Sister    History  Substance Use Topics  . Smoking status: Former Smoker -- 0.25 packs/day for 19 years    Types: Cigarettes  .  Smokeless tobacco: Never Used  . Alcohol Use: Yes     Comment: "quit alcohol ~ 2005; only then drank occasionl wine cooler"   OB History    Gravida Para Term Preterm AB TAB SAB Ectopic Multiple Living   0 0 0 0 0 0 0 0 0 0      Review of Systems  All other systems reviewed and are negative.     Allergies  Review of patient's allergies indicates no known allergies.  Home Medications   Prior to Admission medications   Medication Sig Start Date End Date Taking? Authorizing Provider  acetaminophen-codeine (TYLENOL #3) 300-30 MG per tablet Take 1 tablet by mouth every 4 (four) hours as needed. 10/12/13   Quentin Angstlugbemiga E Jegede, MD  albuterol (PROVENTIL HFA;VENTOLIN HFA) 108 (90 BASE) MCG/ACT inhaler Inhale 2 puffs into the lungs every 4 (four) hours as needed for wheezing or shortness of breath. For shortness of breath 12/15/13   Jeralyn BennettEzequiel [name], MD  ARIPiprazole (ABILIFY) 20 MG tablet Take 1 tablet (20 mg total) by mouth daily. 09/16/13   Quentin Angstlugbemiga E Jegede, MD  atenolol (TENORMIN) 25 MG tablet Take 1 tablet (25 mg total) by mouth daily. 04/30/13   Alison MurrayAlma M Devine, MD  atorvastatin (LIPITOR) 10 MG tablet Take 1 tablet (10 mg total) by mouth daily. 04/30/13   Alison MurrayAlma M Devine, MD  DULoxetine (CYMBALTA) 60  MG capsule Take 1 capsule (60 mg total) by mouth daily. 03/05/14   Quentin Angst, MD  Fluticasone-Salmeterol (ADVAIR) 250-50 MCG/DOSE AEPB Inhale 1 puff into the lungs every 12 (twelve) hours. For shortness of breath 09/02/13   Quentin Angst, MD  gabapentin (NEURONTIN) 600 MG tablet Take 1 tablet (600 mg total) by mouth 3 (three) times daily. 04/30/13   Alison Murray, MD  hydrochlorothiazide (HYDRODIURIL) 12.5 MG tablet Take 2 tablets (25 mg total) by mouth daily. 04/30/13   Alison Murray, MD  loratadine (CLARITIN) 10 MG tablet Take 1 tablet (10 mg total) by mouth daily. 04/30/13   Alison Murray, MD  meloxicam (MOBIC) 15 MG tablet Take 1 tablet (15 mg total) by mouth daily as needed for pain.  Take with food 04/30/13   Alison Murray, MD  metFORMIN (GLUCOPHAGE) 500 MG tablet Take 1 tablet (500 mg total) by mouth 2 (two) times daily with a meal. 12/15/13   Jeralyn Bennett, MD  pantoprazole (PROTONIX) 40 MG tablet Take 1 tablet (40 mg total) by mouth daily. 04/30/13   Alison Murray, MD  predniSONE (STERAPRED UNI-PAK) 10 MG tablet Take 6-5-4-3-2-1 tablets by mouth daily till gone. 12/15/13   Jeralyn Bennett, MD  sertraline (ZOLOFT) 100 MG tablet Take 1 tablet (100 mg total) by mouth daily. 04/30/13   Alison Murray, MD   BP 157/86 mmHg  Pulse 73  Temp(Src) 97.6 F (36.4 C)  Resp 18  Ht 5\' 8"  (1.727 m)  Wt 301 lb (136.533 kg)  BMI 45.78 kg/m2  SpO2 98% Physical Exam  Constitutional: She appears well-developed and well-nourished. No distress.  HENT:  Head: Normocephalic and atraumatic.  Neck: Neck supple.  Cardiovascular: Normal rate and regular rhythm.   Pulmonary/Chest: Effort normal and breath sounds normal. No respiratory distress. She has no wheezes. She has no rales.  Abdominal: Soft. She exhibits no distension. There is tenderness in the left upper quadrant. There is no rebound and no guarding.    Neurological: She is alert.  Skin: She is not diaphoretic.  Nursing note and vitals reviewed.   ED Course  Procedures (including critical care time) Labs Review Labs Reviewed  COMPREHENSIVE METABOLIC PANEL - Abnormal; Notable for the following:    Potassium 3.6 (*)    Glucose, Bld 151 (*)    Albumin 3.3 (*)    AST 58 (*)    ALT 56 (*)    All other components within normal limits  CBC WITH DIFFERENTIAL  LIPASE, BLOOD  URINALYSIS, ROUTINE W REFLEX MICROSCOPIC  POC URINE PREG, ED    Imaging Review Ct Abdomen Pelvis Wo Contrast  05/02/2014   CLINICAL DATA:  Left flank pain with N/c and dizziness since last night- Elevated LFT's- \ PMH: DM, CAD, HTN, Choleycystectomy  EXAM: CT ABDOMEN AND PELVIS WITHOUT CONTRAST  TECHNIQUE: Multidetector CT imaging of the abdomen and pelvis  was performed following the standard protocol without IV contrast.  COMPARISON:  05/27/2004  FINDINGS: Kidneys are normal in size, orientation and position. No renal masses or stones. No hydronephrosis. Normal ureters. Normal bladder.  Liver is mildly enlarged measuring 28 cm in greatest transverse dimension. Liver shows morphologic changes with relative enlargement of the lateral segment of the left lobe and some central volume loss. Consider cirrhosis in the proper clinical setting. No liver mass or focal lesion.  Gallbladder surgically absent.  No bile duct dilation.  Normal spleen, pancreas and adrenal glands. Uterus and adnexa are unremarkable. No pathologically  enlarged lymph nodes. No ascites.  Colon and small bowel are unremarkable. Normal appendix is visualized.  There is a small amount of fluid along the subcutaneous soft tissues of the low anterior abdominal wall near the umbilicus with hazy opacity in the adjacent subcutaneous fat. This is likely a small chronic postoperative collection. It is significantly smaller than it was in 2005. No hernia.  Minor reticular opacities at the lung bases likely subsegmental atelectasis and/ or scarring. Heart is mildly enlarged.  No osteoblastic or osteolytic lesions. Degenerative changes noted of the lower thoracic and lumbar spine, most notably lower lumbar spine facet degenerative change.  IMPRESSION: 1. No acute findings. No findings to explain left flank pain. Kidneys are unremarkable with no stones or evidence of obstructive uropathy. 2. Morphologic changes of the liver with relative enlargement of the lateral segment of the left lobe and some central volume loss. Mild hepatic megaly. Consider a component of cirrhosis in the proper clinical setting. No significant fatty infiltration. 3. Small chronic postoperative fluid collection along the anterior abdominal wall adjacent to the umbilicus. 4. Mild cardiomegaly.  Changes from a cholecystectomy.   Electronically  Signed   By: Amie Portlandavid  Ormond M.D.   On: 05/02/2014 15:20     EKG Interpretation None     3:56 PM Reexamination of abdomen remains benign, unchanged.    MDM   Final diagnoses:  Left flank pain  Non-intractable vomiting with nausea, vomiting of unspecified type  Elevated LFTs  Enlarged liver    Afebrile, nontoxic patient with left sided abdominal pain with urinary frequency and urgency, N/V, lightheadedness.  Labs remarkable for mild hypokalemia and mild AST/ALT increase.  Urine negative. CT abd/pelvis chronic changes from hernia surgery and liver changes, also mild cardiomegaly.  Pt denies ETOH use.  D/C home with norco, phenergan, GI, PCP follow up.   Discussed result, findings, treatment, and follow up  with patient.  Pt given return precautions.  Pt verbalizes understanding and agrees with plan.         Trixie Dredgemily Blanton Kardell, PA-C 05/02/14 1556  Suzi RootsKevin E Steinl, MD 05/07/14 [phone]

## 2014-05-02 NOTE — Discharge Instructions (Signed)
Read the information below.  Use the prescribed medication as directed.  Please discuss all new medications with your pharmacist.  Do not take additional tylenol while taking the prescribed pain medication to avoid overdose.  You may return to the Emergency Department at any time for worsening condition or any new symptoms that concern you.  If you develop high fevers, worsening abdominal pain, uncontrolled vomiting, or are unable to tolerate fluids by mouth, return to the ER for a recheck.     Abdominal Pain Many things can cause abdominal pain. Usually, abdominal pain is not caused by a disease and will improve without treatment. It can often be observed and treated at home. Your health care provider will do a physical exam and possibly order blood tests and X-rays to help determine the seriousness of your pain. However, in many cases, more time must pass before a clear cause of the pain can be found. Before that point, your health care provider may not know if you need more testing or further treatment. HOME CARE INSTRUCTIONS  Monitor your abdominal pain for any changes. The following actions may help to alleviate any discomfort you are experiencing:  Only take over-the-counter or prescription medicines as directed by your health care provider.  Do not take laxatives unless directed to do so by your health care provider.  Try a clear liquid diet (broth, tea, or water) as directed by your health care provider. Slowly move to a bland diet as tolerated. SEEK MEDICAL CARE IF:  You have unexplained abdominal pain.  You have abdominal pain associated with nausea or diarrhea.  You have pain when you urinate or have a bowel movement.  You experience abdominal pain that wakes you in the night.  You have abdominal pain that is worsened or improved by eating food.  You have abdominal pain that is worsened with eating fatty foods.  You have a fever. SEEK IMMEDIATE MEDICAL CARE IF:   Your pain  does not go away within 2 hours.  You keep throwing up (vomiting).  Your pain is felt only in portions of the abdomen, such as the right side or the left lower portion of the abdomen.  You pass bloody or black tarry stools. MAKE SURE YOU:  Understand these instructions.   Will watch your condition.   Will get help right away if you are not doing well or get worse.  Document Released: 03/21/2005 Document Revised: 06/16/2013 Document Reviewed: 02/18/2013 [facility] Patient Information 2015 [place], [place]. This information is not intended to replace advice given to you by your health care provider. Make sure you discuss any questions you have with your health care provider.  Nausea and Vomiting Nausea is a sick feeling that often comes before throwing up (vomiting). Vomiting is a reflex where stomach contents come out of your mouth. Vomiting can cause severe loss of body fluids (dehydration). Children and elderly adults can become dehydrated quickly, especially if they also have diarrhea. Nausea and vomiting are symptoms of a condition or disease. It is important to find the cause of your symptoms. CAUSES   Direct irritation of the stomach lining. This irritation can result from increased acid production (gastroesophageal reflux disease), infection, food poisoning, taking certain medicines (such as nonsteroidal anti-inflammatory drugs), alcohol use, or tobacco use.  Signals from the brain.These signals could be caused by a headache, heat exposure, an inner ear disturbance, increased pressure in the brain from injury, infection, a tumor, or a concussion, pain, emotional stimulus, or metabolic problems.  An obstruction in the gastrointestinal tract (bowel obstruction).  Illnesses such as diabetes, hepatitis, gallbladder problems, appendicitis, kidney problems, cancer, sepsis, atypical symptoms of a heart attack, or eating disorders.  Medical treatments such as chemotherapy and  radiation.  Receiving medicine that makes you sleep (general anesthetic) during surgery. DIAGNOSIS Your caregiver may ask for tests to be done if the problems do not improve after a few days. Tests may also be done if symptoms are severe or if the reason for the nausea and vomiting is not clear. Tests may include:  Urine tests.  Blood tests.  Stool tests.  Cultures (to look for evidence of infection).  X-rays or other imaging studies. Test results can help your caregiver make decisions about treatment or the need for additional tests. TREATMENT You need to stay well hydrated. Drink frequently but in small amounts.You may wish to drink water, sports drinks, clear broth, or eat frozen ice pops or gelatin dessert to help stay hydrated.When you eat, eating slowly may help prevent nausea.There are also some antinausea medicines that may help prevent nausea. HOME CARE INSTRUCTIONS   Take all medicine as directed by your caregiver.  If you do not have an appetite, do not force yourself to eat. However, you must continue to drink fluids.  If you have an appetite, eat a normal diet unless your caregiver tells you differently.  Eat a variety of complex carbohydrates (rice, wheat, potatoes, bread), lean meats, yogurt, fruits, and vegetables.  Avoid high-fat foods because they are more difficult to digest.  Drink enough water and fluids to keep your urine clear or pale yellow.  If you are dehydrated, ask your caregiver for specific rehydration instructions. Signs of dehydration may include:  Severe thirst.  Dry lips and mouth.  Dizziness.  Dark urine.  Decreasing urine frequency and amount.  Confusion.  Rapid breathing or pulse. SEEK IMMEDIATE MEDICAL CARE IF:   You have blood or brown flecks (like coffee grounds) in your vomit.  You have black or bloody stools.  You have a severe headache or stiff neck.  You are confused.  You have severe abdominal pain.  You have  chest pain or trouble breathing.  You do not urinate at least once every 8 hours.  You develop cold or clammy skin.  You continue to vomit for longer than 24 to 48 hours.  You have a fever. MAKE SURE YOU:   Understand these instructions.  Will watch your condition.  Will get help right away if you are not doing well or get worse. Document Released: 06/11/2005 Document Revised: 09/03/2011 Document Reviewed: 11/08/2010 [facility] Patient Information 2015 [employer], [place]. This information is not intended to replace advice given to you by your health care provider. Make sure you discuss any questions you have with your health care provider.

## 2014-05-02 NOTE — ED Notes (Signed)
Pt c/o left flank pain with N/V and dizziness onset last night. Pt reports increase urination.

## 2014-05-10 ENCOUNTER — Ambulatory Visit: Payer: No Typology Code available for payment source | Attending: Internal Medicine | Admitting: Internal Medicine

## 2014-05-10 ENCOUNTER — Encounter: Payer: Self-pay | Admitting: Internal Medicine

## 2014-05-10 VITALS — BP 138/85 | HR 69 | Temp 98.2°F | Resp 14 | Ht 68.0 in | Wt 299.0 lb

## 2014-05-10 DIAGNOSIS — F1221 Cannabis dependence, in remission: Secondary | ICD-10-CM | POA: Insufficient documentation

## 2014-05-10 DIAGNOSIS — R634 Abnormal weight loss: Secondary | ICD-10-CM | POA: Insufficient documentation

## 2014-05-10 DIAGNOSIS — E785 Hyperlipidemia, unspecified: Secondary | ICD-10-CM | POA: Insufficient documentation

## 2014-05-10 DIAGNOSIS — Z87891 Personal history of nicotine dependence: Secondary | ICD-10-CM | POA: Insufficient documentation

## 2014-05-10 DIAGNOSIS — R42 Dizziness and giddiness: Secondary | ICD-10-CM

## 2014-05-10 DIAGNOSIS — F1021 Alcohol dependence, in remission: Secondary | ICD-10-CM | POA: Insufficient documentation

## 2014-05-10 DIAGNOSIS — Z79899 Other long term (current) drug therapy: Secondary | ICD-10-CM | POA: Insufficient documentation

## 2014-05-10 DIAGNOSIS — E119 Type 2 diabetes mellitus without complications: Secondary | ICD-10-CM

## 2014-05-10 DIAGNOSIS — J45909 Unspecified asthma, uncomplicated: Secondary | ICD-10-CM | POA: Insufficient documentation

## 2014-05-10 DIAGNOSIS — I10 Essential (primary) hypertension: Secondary | ICD-10-CM

## 2014-05-10 DIAGNOSIS — F329 Major depressive disorder, single episode, unspecified: Secondary | ICD-10-CM | POA: Insufficient documentation

## 2014-05-10 DIAGNOSIS — F1421 Cocaine dependence, in remission: Secondary | ICD-10-CM | POA: Insufficient documentation

## 2014-05-10 DIAGNOSIS — Z791 Long term (current) use of non-steroidal anti-inflammatories (NSAID): Secondary | ICD-10-CM | POA: Insufficient documentation

## 2014-05-10 DIAGNOSIS — K219 Gastro-esophageal reflux disease without esophagitis: Secondary | ICD-10-CM | POA: Insufficient documentation

## 2014-05-10 DIAGNOSIS — J449 Chronic obstructive pulmonary disease, unspecified: Secondary | ICD-10-CM | POA: Insufficient documentation

## 2014-05-10 DIAGNOSIS — F419 Anxiety disorder, unspecified: Secondary | ICD-10-CM | POA: Insufficient documentation

## 2014-05-10 NOTE — Progress Notes (Signed)
Patient ID: Beth [name], female   DOB: 01/23/1966, 48 y.o.   MRN: [id number]  CC:  Dizziness and nausea  HPI:  Patient presents to clinic today for a three week history of dizziness with nausea.  Patient was evaluated in the ER last week with similar symptoms including abdominal pain. She was discharged with phenergan and a PPI.  She reports that the nausea has been present for four days but has improved greatly since given phenergan.  The dizziness is described as a feeling of off balance with the room spinning when she tries to stand up and walk.  Her sister reports that the patient has been requiring close supervision due to falls.  Her sister states that when she has dizzy spells her faces becomes flushed and her skins appears blotchy.  Patient ins unsure if her skin is related to her history of vitiligo or the dizzy spells.   Patient reports that she has lost a total of 20 pounds in the past five months unintentionally. She is concerned that the weight loss may be related to liver enlargement found on CT scan by a previous provider.  She denies history of alcohol use.    No Known Allergies Past Medical History  Diagnosis Date  . Hypertension   . Asthma   . Coronary artery disease   . Hyperlipidemia   . Phlebitis   . Asthmatic bronchitis , chronic   . Depression   . Anxiety   . Type II diabetes mellitus   . History of blood transfusion     "related to nose would not stop bleeding"  . GERD (gastroesophageal reflux disease)   . Arthritis     "knees" (12/14/2013)   Current Outpatient Prescriptions on File Prior to Visit  Medication Sig Dispense Refill  . albuterol (PROVENTIL HFA;VENTOLIN HFA) 108 (90 BASE) MCG/ACT inhaler Inhale 2 puffs into the lungs every 4 (four) hours as needed for wheezing or shortness of breath. For shortness of breath 3 Inhaler 3  . ARIPiprazole (ABILIFY) 20 MG tablet Take 1 tablet (20 mg total) by mouth daily. 90 tablet 3  . atenolol (TENORMIN) 25 MG tablet  Take 1 tablet (25 mg total) by mouth daily. 30 tablet 5  . atorvastatin (LIPITOR) 10 MG tablet Take 1 tablet (10 mg total) by mouth daily. 30 tablet 5  . DULoxetine (CYMBALTA) 60 MG capsule Take 1 capsule (60 mg total) by mouth daily. 90 capsule 3  . Fluticasone-Salmeterol (ADVAIR) 250-50 MCG/DOSE AEPB Inhale 1 puff into the lungs every 12 (twelve) hours. For shortness of breath 3 each 3  . gabapentin (NEURONTIN) 600 MG tablet Take 1 tablet (600 mg total) by mouth 3 (three) times daily. 90 tablet 5  . hydrochlorothiazide (HYDRODIURIL) 12.5 MG tablet Take 2 tablets (25 mg total) by mouth daily. 30 tablet 5  . loratadine (CLARITIN) 10 MG tablet Take 1 tablet (10 mg total) by mouth daily. 30 tablet 5  . meloxicam (MOBIC) 15 MG tablet Take 1 tablet (15 mg total) by mouth daily as needed for pain. Take with food 30 tablet 5  . promethazine (PHENERGAN) 25 MG tablet Take 1 tablet (25 mg total) by mouth every 6 (six) hours as needed for nausea or vomiting. 15 tablet 0  . sertraline (ZOLOFT) 100 MG tablet Take 1 tablet (100 mg total) by mouth daily. 30 tablet 5  . acetaminophen-codeine (TYLENOL #3) 300-30 MG per tablet Take 1 tablet by mouth every 4 (four) hours as needed. 60 tablet 0  .  HYDROcodone-acetaminophen (NORCO/VICODIN) 5-325 MG per tablet Take 1 tablet by mouth every 4 (four) hours as needed for moderate pain or severe pain. 15 tablet 0  . metFORMIN (GLUCOPHAGE) 500 MG tablet Take 1 tablet (500 mg total) by mouth 2 (two) times daily with a meal. 30 tablet 0  . pantoprazole (PROTONIX) 40 MG tablet Take 1 tablet (40 mg total) by mouth daily. 30 tablet 5  . predniSONE (STERAPRED UNI-PAK) 10 MG tablet Take 6-5-4-3-2-1 tablets by mouth daily till gone. 21 tablet 0  . [DISCONTINUED] buPROPion (WELLBUTRIN XL) 150 MG 24 hr tablet Take 150 mg by mouth daily.      . [DISCONTINUED] metoprolol succinate (TOPROL-XL) 100 MG 24 hr tablet Take 1 tablet (100 mg total) by mouth daily. 30 tablet 2  . [DISCONTINUED]  pravastatin (PRAVACHOL) 40 MG tablet Take 40 mg by mouth daily.      . [DISCONTINUED] risperiDONE (RISPERDAL) 2 MG tablet Take 2 mg by mouth at bedtime.       No current facility-administered medications on file prior to visit.   Family History  Problem Relation Age of Onset  . Depression Mother   . Hypertension Mother   . Diabetes Mother   . Hypertension Father   . Hyperlipidemia Father   . Depression Sister    History   Social History  . Marital Status: Divorced    Spouse Name: N/A    Number of Children: N/A  . Years of Education: N/A   Occupational History  . Not on file.   Social History Main Topics  . Smoking status: Former Smoker -- 0.25 packs/day for 19 years    Types: Cigarettes  . Smokeless tobacco: Never Used  . Alcohol Use: Yes     Comment: "quit alcohol ~ 2005; only then drank occasionl wine cooler"  . Drug Use: Yes    Special: Cocaine, Marijuana, "Crack" cocaine     Comment: "LAST DRUG USE WAS IN 2005"  . Sexual Activity: Not Currently     Comment: "quit smoking cigarettes ~2005"   Other Topics Concern  . Not on file   Social History Narrative    Review of Systems  Constitutional: Positive for weight loss. Negative for fever and chills.  Eyes: Negative for blurred vision.  Respiratory: Negative.   Cardiovascular: Negative.   Gastrointestinal: Positive for nausea and vomiting. Negative for heartburn, diarrhea and constipation. Abdominal pain: resolved.  Genitourinary: Negative.   Skin: Negative.   Neurological: Positive for dizziness, tingling and weakness. Negative for headaches.  Psychiatric/Behavioral: Negative.        Objective:   Filed Vitals:   05/10/14 1546  BP: 138/85  Pulse: 69  Temp: 98.2 F (36.8 C)  Resp: 14    Physical Exam  Constitutional: She is oriented to person, place, and time. No distress.  HENT:  Right Ear: External ear normal.  Left Ear: External ear normal.  Neck: Neck supple. No thyromegaly present.   Cardiovascular: Normal rate, regular rhythm and normal heart sounds.   Pulmonary/Chest: Effort normal and breath sounds normal.  Abdominal: Soft. Bowel sounds are normal.  Musculoskeletal: Normal range of motion. She exhibits no edema.  Lymphadenopathy:    She has no cervical adenopathy.  Neurological: She is alert and oriented to person, place, and time.  Skin: Skin is warm and dry. No rash noted. She is not diaphoretic.  Blotchy hyperpigmented areas on face  Psychiatric: She has a normal mood and affect.     Lab Results  Component Value  Date   WBC 9.1 05/02/2014   HGB 13.7 05/02/2014   HCT 41.9 05/02/2014   MCV 85.9 05/02/2014   PLT 307 05/02/2014   Lab Results  Component Value Date   CREATININE 0.69 05/02/2014   BUN 7 05/02/2014   NA 140 05/02/2014   K 3.6* 05/02/2014   CL 101 05/02/2014   CO2 25 05/02/2014    Lab Results  Component Value Date   HGBA1C 5.5 04/23/2014   Lipid Panel     Component Value Date/Time   CHOL 184 07/31/2013 1558   TRIG 120 07/31/2013 1558   HDL 32* 07/31/2013 1558   CHOLHDL 5.8 07/31/2013 1558   VLDL 24 07/31/2013 1558   LDLCALC 128* 07/31/2013 1558       Assessment and plan:   Rinaldo Cloudamela was seen today for follow-up.  Diagnoses and associated orders for this visit:  Essential hypertension Patient blood pressure is stable and may continue on current medication.  Education on diet, exercise, and modifiable risk factors discussed. Will obtain appropriate labs as needed. Will follow up in 3-6 months.   Type 2 diabetes mellitus without complication Patient never began taking Metformin, had improvement of A1c since last visit on her own. She is controlled for now  Dizziness - TSH - Hepatic Function Panel - CT Head W Contrast; Future All autoimmune test have been negative in the past  Will follow up with patient once I get the results of her TSH and CT scan.  Will look at possible vertigo if everything returns normal.       Holland CommonsKECK,  VALERIE, NP-C [facility] [phone] 05/10/2014, 4:25 PM

## 2014-05-10 NOTE — Progress Notes (Signed)
Pt is here c/o dizziness and nausea for several weeks w/ vomiting.

## 2014-05-11 LAB — TSH: TSH: 3.047 u[IU]/mL (ref 0.350–4.500)

## 2014-05-11 LAB — HEPATIC FUNCTION PANEL
ALBUMIN: 3.9 g/dL (ref 3.5–5.2)
ALK PHOS: 94 U/L (ref 39–117)
ALT: 70 U/L — ABNORMAL HIGH (ref 0–35)
AST: 84 U/L — ABNORMAL HIGH (ref 0–37)
Bilirubin, Direct: 0.1 mg/dL (ref 0.0–0.3)
Indirect Bilirubin: 0.5 mg/dL (ref 0.2–1.2)
TOTAL PROTEIN: 8.5 g/dL — AB (ref 6.0–8.3)
Total Bilirubin: 0.6 mg/dL (ref 0.2–1.2)

## 2014-05-14 ENCOUNTER — Ambulatory Visit (HOSPITAL_COMMUNITY)
Admission: RE | Admit: 2014-05-14 | Discharge: 2014-05-14 | Disposition: A | Discharge status: Home / Self Care | Payer: No Typology Code available for payment source | Source: Ambulatory Visit | Attending: Internal Medicine | Admitting: Internal Medicine

## 2014-05-14 DIAGNOSIS — R42 Dizziness and giddiness: Secondary | ICD-10-CM | POA: Insufficient documentation

## 2014-05-14 DIAGNOSIS — J0101 Acute recurrent maxillary sinusitis: Secondary | ICD-10-CM | POA: Insufficient documentation

## 2014-05-14 DIAGNOSIS — J329 Chronic sinusitis, unspecified: Secondary | ICD-10-CM | POA: Insufficient documentation

## 2014-05-27 ENCOUNTER — Telehealth: Payer: Self-pay | Admitting: Internal Medicine

## 2014-05-27 NOTE — Telephone Encounter (Signed)
Patient has called in today to see if she can receive a call about her last lab results; please f/u with patient

## 2014-06-01 ENCOUNTER — Telehealth: Payer: Self-pay | Admitting: Emergency Medicine

## 2014-06-01 MED ORDER — FLUTICASONE PROPIONATE 50 MCG/ACT NA SUSP: 1.0000 | Freq: Every day | NASAL | Status: AC

## 2014-06-01 MED ORDER — AMOXICILLIN-POT CLAVULANATE 875-125 MG PO TABS: 1.0000 | ORAL_TABLET | Freq: Two times a day (BID) | ORAL | Status: DC

## 2014-06-01 NOTE — Telephone Encounter (Signed)
-----   Message from Ambrose FinlandValerie A Keck, NP sent at 05/24/2014  2:28 PM EST ----- Ct reveals chronic and acute maxillary sinusitis, that may be the cause of her dizziness.  Send patient flonase 2 sprays each nare daily and Augmentin DS BID for 10 days.  Explain to her that using a humidifier may help with inflammation of the sinuses or OTC nasal saline.

## 2014-06-01 NOTE — Telephone Encounter (Signed)
Pt given CT results  Medication Augmentin,Flonase e-scribed to [facility] pharmacy

## 2014-06-14 ENCOUNTER — Other Ambulatory Visit: Payer: Self-pay | Admitting: Internal Medicine

## 2014-06-21 ENCOUNTER — Emergency Department (INDEPENDENT_AMBULATORY_CARE_PROVIDER_SITE_OTHER)
Admission: EM | Admit: 2014-06-21 | Discharge: 2014-06-21 | Disposition: A | Discharge status: Home / Self Care | Payer: Self-pay | Source: Home / Self Care | Attending: Family Medicine | Admitting: Family Medicine

## 2014-06-21 ENCOUNTER — Encounter (HOSPITAL_COMMUNITY): Payer: Self-pay | Admitting: Emergency Medicine

## 2014-06-21 ENCOUNTER — Telehealth: Payer: Self-pay | Admitting: Internal Medicine

## 2014-06-21 ENCOUNTER — Emergency Department (INDEPENDENT_AMBULATORY_CARE_PROVIDER_SITE_OTHER): Payer: Self-pay

## 2014-06-21 DIAGNOSIS — L98499 Non-pressure chronic ulcer of skin of other sites with unspecified severity: Secondary | ICD-10-CM

## 2014-06-21 MED ORDER — SULFAMETHOXAZOLE-TRIMETHOPRIM 800-160 MG PO TABS: 2.0000 | ORAL_TABLET | Freq: Two times a day (BID) | ORAL | Status: DC

## 2014-06-21 NOTE — ED Provider Notes (Signed)
Beth [name] is a 48 y.o. female who presents to Urgent Care today for  left leg ulceration. Patient has a history of peripheral vascular disease and venous stasis dermatitis. She's had a wound in her left anterior leg for about 6 months. Today she was picking at her wound and pulled a hard own like material out of the wound. She notes the pain has worsened since then. No fevers or chills vomiting or diarrhea.   Past Medical History  Diagnosis Date  . Hypertension   . Asthma   . Coronary artery disease   . Hyperlipidemia   . Phlebitis   . Asthmatic bronchitis , chronic   . Depression   . Anxiety   . Type II diabetes mellitus   . History of blood transfusion     "related to nose would not stop bleeding"  . GERD (gastroesophageal reflux disease)   . Arthritis     "knees" (12/14/2013)   Past Surgical History  Procedure Laterality Date  . Hernia repair    . Umbilical hernia repair  05/2004  . Cholecystectomy    . Dilation and curettage of uterus    . Hysteroscopy w/d&c N/A 08/04/2013    Procedure: DILATATION AND CURETTAGE /HYSTEROSCOPY;  Surgeon: Adam PhenixJames G Arnold, MD;  Location: [facility];  Service: Gynecology;  Laterality: N/A;   History  Substance Use Topics  . Smoking status: Former Smoker -- 0.25 packs/day for 19 years    Types: Cigarettes  . Smokeless tobacco: Never Used  . Alcohol Use: Yes     Comment: "quit alcohol ~ 2005; only then drank occasionl wine cooler"   ROS as above Medications: No current facility-administered medications for this encounter.   Current Outpatient Prescriptions  Medication Sig Dispense Refill  . albuterol (PROVENTIL HFA;VENTOLIN HFA) 108 (90 BASE) MCG/ACT inhaler Inhale 2 puffs into the lungs every 4 (four) hours as needed for wheezing or shortness of breath. For shortness of breath 3 Inhaler 3  . ARIPiprazole (ABILIFY) 20 MG tablet Take 1 tablet (20 mg total) by mouth daily. 90 tablet 3  . atenolol (TENORMIN) 25 MG tablet Take 1 tablet (25 mg  total) by mouth daily. 30 tablet 5  . DULoxetine (CYMBALTA) 60 MG capsule Take 1 capsule (60 mg total) by mouth daily. 90 capsule 3  . Fluticasone-Salmeterol (ADVAIR) 250-50 MCG/DOSE AEPB Inhale 1 puff into the lungs every 12 (twelve) hours. For shortness of breath 3 each 3  . gabapentin (NEURONTIN) 600 MG tablet Take 1 tablet (600 mg total) by mouth 3 (three) times daily. 90 tablet 5  . hydrochlorothiazide (HYDRODIURIL) 12.5 MG tablet Take 2 tablets (25 mg total) by mouth daily. 30 tablet 5  . sertraline (ZOLOFT) 100 MG tablet Take 1 tablet (100 mg total) by mouth daily. 30 tablet 5  . atorvastatin (LIPITOR) 10 MG tablet Take 1 tablet (10 mg total) by mouth daily. 30 tablet 5  . fluticasone (FLONASE) 50 MCG/ACT nasal spray Place 1 spray into both nostrils daily. 16 g 6  . HYDROcodone-acetaminophen (NORCO/VICODIN) 5-325 MG per tablet Take 1 tablet by mouth every 4 (four) hours as needed for moderate pain or severe pain. 15 tablet 0  . loratadine (CLARITIN) 10 MG tablet Take 1 tablet (10 mg total) by mouth daily. 30 tablet 5  . meloxicam (MOBIC) 15 MG tablet Take 1 tablet (15 mg total) by mouth daily as needed for pain. Take with food 30 tablet 5  . promethazine (PHENERGAN) 25 MG tablet Take 1 tablet (25  mg total) by mouth every 6 (six) hours as needed for nausea or vomiting. 15 tablet 0  . sulfamethoxazole-trimethoprim (SEPTRA DS) 800-160 MG per tablet Take 2 tablets by mouth 2 (two) times daily. 28 tablet 0  . [DISCONTINUED] buPROPion (WELLBUTRIN XL) 150 MG 24 hr tablet Take 150 mg by mouth daily.      . [DISCONTINUED] metoprolol succinate (TOPROL-XL) 100 MG 24 hr tablet Take 1 tablet (100 mg total) by mouth daily. 30 tablet 2  . [DISCONTINUED] pravastatin (PRAVACHOL) 40 MG tablet Take 40 mg by mouth daily.      . [DISCONTINUED] risperiDONE (RISPERDAL) 2 MG tablet Take 2 mg by mouth at bedtime.       No Known Allergies   Exam:  BP 168/89 mmHg  Pulse 92  Temp(Src) 98 F (36.7 C) (Oral)   Resp 20  SpO2 100%  LMP 03/27/2014 Gen: Well NAD morbidly obese Left leg: Chronic venous stasis dermatitis present. Small ulceration left anterior mid tibia area. Minimally tender. Distal pulses and capillary refill and sensation intact.  No results found for this or any previous visit (from the past 24 hour(s)). Dg Tibia/fibula Left  06/21/2014   CLINICAL DATA:  Left leg pain.  Deep skin ulcer.  EXAM: LEFT TIBIA AND FIBULA - 2 VIEW  COMPARISON:  None.  FINDINGS: Chronic soft tissue calcifications are noted. No evidence of soft tissue gas. No evidence of osteolysis or periostitis. No evidence of fracture or other bone lesions. Knee osteoarthritis is seen predominately involving in the medial compartment.  IMPRESSION: No acute findings.   Electronically Signed   By: Myles RosenthalJohn  Stahl M.D.   On: 06/21/2014 18:02    Assessment and Plan: 48 y.o. female with chronic left leg diabetic wound ulcer. Patient has multiple calcifications scattered across her leg. She picked one of these out of her wound. Will treat with Bactrim for one week. Encouraged patient follow-up with PCP for further management of chronic venous stasis/diabetic leg ulcers.  Discussed warning signs or symptoms. Please see discharge instructions. Patient expresses understanding.     Rodolph BongEvan S Zissel Biederman, MD 06/21/14 [phone]

## 2014-06-21 NOTE — Discharge Instructions (Signed)
Thank you for coming in today. Take Bactrim twice daily Keep wound clean Keep your leg elevated Follow-up with primary care provider  Skin Ulcer A skin ulcer is an open sore that can be shallow or deep. Skin ulcers sometimes become infected and are difficult to treat. It may be 1 month or longer before real healing progress is made. CAUSES   Injury.  Problems with the veins or arteries.  Diabetes.  Insect bites.  Bedsores.  Inflammatory conditions. SYMPTOMS   Pain, redness, swelling, and tenderness around the ulcer.  Fever.  Bleeding from the ulcer.  Yellow or clear fluid coming from the ulcer. DIAGNOSIS  There are many types of skin ulcers. Any open sores will be examined. Certain tests will be done to determine the kind of ulcer you have. The right treatment depends on the type of ulcer you have. TREATMENT  Treatment is a long-term challenge. It may include:  Wearing an elastic wrap, compression stockings, or gel cast over the ulcer area.  Taking antibiotic medicines or putting antibiotic creams on the affected area if there is an infection. HOME CARE INSTRUCTIONS  Put on your bandages (dressings), wraps, or casts over the ulcer as directed by your caregiver.  Change all dressings as directed by your caregiver.  Take all medicines as directed by your caregiver.  Keep the affected area clean and dry.  Avoid injuries to the affected area.  Eat a well-balanced, healthy diet that includes plenty of fruit and vegetables.  If you smoke, consider quitting or decreasing the amount of cigarettes you smoke.  Once the ulcer heals, get regular exercise as directed by your caregiver.  Work with your caregiver to make sure your blood pressure, cholesterol, and diabetes are well-controlled.  Keep your skin moisturized. Dry skin can crack and lead to skin ulcers. SEEK IMMEDIATE MEDICAL CARE IF:   Your pain gets worse.  You have swelling, redness, or fluids around the  ulcer.  You have chills.  You have a fever. MAKE SURE YOU:   Understand these instructions.  Will watch your condition.  Will get help right away if you are not doing well or get worse. Document Released: 07/19/2004 Document Revised: 09/03/2011 Document Reviewed: 01/26/2011 [facility] [employer] Patient Information 2015 [employer] [place]. This information is not intended to replace advice given to you by your health care provider. Make sure you discuss any questions you have with your health care provider.

## 2014-06-21 NOTE — ED Notes (Signed)
Pt states she has had a sore on her left shin for over 6 months that wont go away.  Today, she pulled what she called bone out of the sore, called her doctor and he told her to come here to get checked out.  Pt denies any fever.

## 2014-06-21 NOTE — Telephone Encounter (Addendum)
Patient called stating that this morning she was picking at a sore on her left leg and "a piece of bone came out" of her leg, patient stated that she was in a lot of pain but the pain has gone away, please f/u with pt.

## 2014-06-22 ENCOUNTER — Other Ambulatory Visit: Payer: Self-pay | Admitting: Internal Medicine

## 2014-07-01 ENCOUNTER — Ambulatory Visit: Payer: Self-pay | Attending: Internal Medicine | Admitting: Internal Medicine

## 2014-07-01 ENCOUNTER — Encounter: Payer: Self-pay | Admitting: Internal Medicine

## 2014-07-01 VITALS — BP 133/86 | HR 83 | Temp 98.2°F | Resp 16 | Ht 68.0 in | Wt 300.0 lb

## 2014-07-01 DIAGNOSIS — I83023 Varicose veins of left lower extremity with ulcer of ankle: Secondary | ICD-10-CM

## 2014-07-01 DIAGNOSIS — L816 Other disorders of diminished melanin formation: Secondary | ICD-10-CM

## 2014-07-01 DIAGNOSIS — I83028 Varicose veins of left lower extremity with ulcer other part of lower leg: Secondary | ICD-10-CM

## 2014-07-01 DIAGNOSIS — I83024 Varicose veins of left lower extremity with ulcer of heel and midfoot: Secondary | ICD-10-CM

## 2014-07-01 DIAGNOSIS — IMO0001 Reserved for inherently not codable concepts without codable children: Secondary | ICD-10-CM

## 2014-07-01 DIAGNOSIS — I83025 Varicose veins of left lower extremity with ulcer other part of foot: Secondary | ICD-10-CM

## 2014-07-01 DIAGNOSIS — I83021 Varicose veins of left lower extremity with ulcer of thigh: Secondary | ICD-10-CM

## 2014-07-01 DIAGNOSIS — I83029 Varicose veins of left lower extremity with ulcer of unspecified site: Secondary | ICD-10-CM

## 2014-07-01 DIAGNOSIS — I83022 Varicose veins of left lower extremity with ulcer of calf: Secondary | ICD-10-CM

## 2014-07-01 DIAGNOSIS — I2510 Atherosclerotic heart disease of native coronary artery without angina pectoris: Secondary | ICD-10-CM | POA: Insufficient documentation

## 2014-07-01 DIAGNOSIS — Z87891 Personal history of nicotine dependence: Secondary | ICD-10-CM | POA: Insufficient documentation

## 2014-07-01 DIAGNOSIS — L819 Disorder of pigmentation, unspecified: Secondary | ICD-10-CM

## 2014-07-01 DIAGNOSIS — L97909 Non-pressure chronic ulcer of unspecified part of unspecified lower leg with unspecified severity: Secondary | ICD-10-CM | POA: Insufficient documentation

## 2014-07-01 DIAGNOSIS — I10 Essential (primary) hypertension: Secondary | ICD-10-CM | POA: Insufficient documentation

## 2014-07-01 DIAGNOSIS — F419 Anxiety disorder, unspecified: Secondary | ICD-10-CM | POA: Insufficient documentation

## 2014-07-01 DIAGNOSIS — E785 Hyperlipidemia, unspecified: Secondary | ICD-10-CM | POA: Insufficient documentation

## 2014-07-01 DIAGNOSIS — F329 Major depressive disorder, single episode, unspecified: Secondary | ICD-10-CM | POA: Insufficient documentation

## 2014-07-01 DIAGNOSIS — Z79899 Other long term (current) drug therapy: Secondary | ICD-10-CM | POA: Insufficient documentation

## 2014-07-01 DIAGNOSIS — J45909 Unspecified asthma, uncomplicated: Secondary | ICD-10-CM | POA: Insufficient documentation

## 2014-07-01 DIAGNOSIS — E119 Type 2 diabetes mellitus without complications: Secondary | ICD-10-CM

## 2014-07-01 DIAGNOSIS — K219 Gastro-esophageal reflux disease without esophagitis: Secondary | ICD-10-CM | POA: Insufficient documentation

## 2014-07-01 DIAGNOSIS — R609 Edema, unspecified: Secondary | ICD-10-CM | POA: Insufficient documentation

## 2014-07-01 LAB — CBC
HEMATOCRIT: 39.6 % (ref 36.0–46.0)
Hemoglobin: 13.5 g/dL (ref 12.0–15.0)
MCH: 28.8 pg (ref 26.0–34.0)
MCHC: 34.1 g/dL (ref 30.0–36.0)
MCV: 84.6 fL (ref 78.0–100.0)
MPV: 9.9 fL (ref 8.6–12.4)
PLATELETS: 313 10*3/uL (ref 150–400)
RBC: 4.68 MIL/uL (ref 3.87–5.11)
RDW: 15.6 % — ABNORMAL HIGH (ref 11.5–15.5)
WBC: 9.3 10*3/uL (ref 4.0–10.5)

## 2014-07-01 LAB — POCT GLYCOSYLATED HEMOGLOBIN (HGB A1C): Hemoglobin A1C: 5.6

## 2014-07-01 LAB — GLUCOSE, POCT (MANUAL RESULT ENTRY): POC GLUCOSE: 103 mg/dL — AB (ref 70–99)

## 2014-07-01 MED ORDER — TRAMADOL HCL 50 MG PO TABS: 50.0000 mg | ORAL_TABLET | Freq: Three times a day (TID) | ORAL | Status: DC | PRN

## 2014-07-01 MED ORDER — MELOXICAM 15 MG PO TABS: 15.0000 mg | ORAL_TABLET | Freq: Every day | ORAL | Status: DC | PRN

## 2014-07-01 NOTE — Progress Notes (Signed)
Patient here for follow up from visit to urgent care 49 y.o. female with chronic left leg diabetic wound ulcer. Patient has multiple calcifications scattered across her leg.  She picked one of these out of her wound.  Pt. Was given prescription Bactrim and told to follow up with PCP Had flu shot Got a urine her microalbumin is due

## 2014-07-01 NOTE — Patient Instructions (Signed)
Stasis Ulcer Stasis ulcers occur in the legs when the circulation is damaged. An ulcer may look like a small hole in the skin.  CAUSES Stasis ulcers occur because your veins do not work properly. Veins have valves that help the blood return to the heart. If these valves do not work right, blood flows backwards and backs up into the veins near the skin. This condition causes the veins to become larger because of increased pressure and may lead to a stasis ulcer. SYMPTOMS   Shallow (superficial) sore on the leg.  Clear drainage or weeping from the sore.  Leg pain or a feeling of heaviness. This may be worse at the end of the day.  Leg swelling.  Skin color changes. DIAGNOSIS  Your caregiver will make a diagnosis by examining your leg. Your caregiver may order tests such as an ultrasound or other studies to evaluate the blood flow of the leg. HOME CARE INSTRUCTIONS   Do not stand or sit in one position for long periods of time. Do not sit with your legs crossed. Rest with your legs raised during the day. If possible, it is best if you can elevate your legs above your heart for 30 minutes, 3 to 4 times a day.  Wear elastic stockings or support hose. Do not wear other tight encircling garments around legs, pelvis, or waist. This causes increased pressure in your veins. If your caregiver has applied compressive medicated wraps, use them as instructed.  Walk as much as possible to increase blood flow. If you are taking long rides in a car or plane, take a break to walk around every 2 hours. If not already on aspirin, take a baby aspirin before long trips unless you have medical reasons that prohibit this.  Raise the foot of your bed at night with 2-inch blocks if approved by your caregiver. This may not be desirable if you have heart failure or breathing problems.  If you get a cut in the skin over the vein and the vein bleeds, lie down with your leg raised and gently clean the area with a clean  cloth. Apply pressure on the cut until the bleeding stops. Then place a dressing on the cut. See your caregiver if it continues to bleed or needs stitches. Also, see your caregiver if you develop an infection.Signs of an infection include a fever, redness, increased pain, and drainage of pus.  If your caregiver has given you a follow-up appointment, it is very important to keep that appointment. Not keeping the appointment could result in a chronic or permanent injury, pain, and disability. If there is any problem keeping the appointment, call your caregiver for assistance. SEEK IMMEDIATE MEDICAL CARE IF:  The ulcer area starts to break down.  You have pain, redness, tenderness, pus, or hard swelling in your leg over a vein or near the ulcer.  Your leg pain is uncomfortable.  You develop an unexplained fever.  You develop chest pain or shortness of breath. Document Released: 03/06/2001 Document Revised: 09/03/2011 Document Reviewed: 10/01/2010 [employer] Patient Information 2015 [employer]. This information is not intended to replace advice given to you by your health care provider. Make sure you discuss any questions you have with your health care provider.  

## 2014-07-01 NOTE — Progress Notes (Signed)
Patient ID: Beth [name], female   DOB: Nov 04, 1965, 49 y.o.   MRN: [id number]  CC: leg pain  HPI: Beth [name] is a 49 y.o. female here today for a follow up visit.  Patient has past medical history of HTN, T2DM, CAD, arthritis, GERD.  She states that she was seen by the urgent care two weeks ago for a venous stasis sore on her LLE. She was at that time given Bactrim for infection.  She has 3 days of pills remaining. She notes continued pain in her LLE.  No redness, warmth, or drainage from wound.  No fevers, chills, nausea, or vomiting.   Patient has No headache, No chest pain, No abdominal pain - No Nausea, No new weakness tingling or numbness, No Cough - SOB.  No Known Allergies Past Medical History  Diagnosis Date  . Hypertension   . Asthma   . Coronary artery disease   . Hyperlipidemia   . Phlebitis   . Asthmatic bronchitis , chronic   . Depression   . Anxiety   . Type II diabetes mellitus   . History of blood transfusion     "related to nose would not stop bleeding"  . GERD (gastroesophageal reflux disease)   . Arthritis     "knees" (12/14/2013)   Current Outpatient Prescriptions on File Prior to Visit  Medication Sig Dispense Refill  . ARIPiprazole (ABILIFY) 20 MG tablet Take 1 tablet (20 mg total) by mouth daily. 90 tablet 3  . atenolol (TENORMIN) 25 MG tablet TAKE 1 TABLET BY MOUTH ONCE DAILY 30 tablet 5  . atorvastatin (LIPITOR) 10 MG tablet Take 1 tablet (10 mg total) by mouth daily. 30 tablet 5  . DULoxetine (CYMBALTA) 60 MG capsule Take 1 capsule (60 mg total) by mouth daily. 90 capsule 3  . fluticasone (FLONASE) 50 MCG/ACT nasal spray Place 1 spray into both nostrils daily. 16 g 6  . Fluticasone-Salmeterol (ADVAIR) 250-50 MCG/DOSE AEPB Inhale 1 puff into the lungs every 12 (twelve) hours. For shortness of breath 3 each 3  . gabapentin (NEURONTIN) 600 MG tablet Take 1 tablet (600 mg total) by mouth 3 (three) times daily. 90 tablet 5  . hydrochlorothiazide  (HYDRODIURIL) 12.5 MG tablet Take 2 tablets (25 mg total) by mouth daily. 30 tablet 5  . hydrochlorothiazide (HYDRODIURIL) 25 MG tablet TAKE 1 TABLET BY MOUTH ONCE DAILY 30 tablet 5  . HYDROcodone-acetaminophen (NORCO/VICODIN) 5-325 MG per tablet Take 1 tablet by mouth every 4 (four) hours as needed for moderate pain or severe pain. 15 tablet 0  . loratadine (CLARITIN) 10 MG tablet Take 1 tablet (10 mg total) by mouth daily. 30 tablet 5  . meloxicam (MOBIC) 15 MG tablet Take 1 tablet (15 mg total) by mouth daily as needed for pain. Take with food 30 tablet 5  . promethazine (PHENERGAN) 25 MG tablet Take 1 tablet (25 mg total) by mouth every 6 (six) hours as needed for nausea or vomiting. 15 tablet 0  . sertraline (ZOLOFT) 100 MG tablet Take 1 tablet (100 mg total) by mouth daily. 30 tablet 5  . sulfamethoxazole-trimethoprim (SEPTRA DS) 800-160 MG per tablet Take 2 tablets by mouth 2 (two) times daily. 28 tablet 0  . VENTOLIN HFA 108 (90 BASE) MCG/ACT inhaler INHALE 2 PUFFS INTO THE LUNGS EVERY 4 HOURS AS NEEDED FOR WHEEZING OR SHORTNESS OF BREATH. (MAP) 54 each PRN  . [DISCONTINUED] buPROPion (WELLBUTRIN XL) 150 MG 24 hr tablet Take 150 mg by mouth daily.      . [  DISCONTINUED] metoprolol succinate (TOPROL-XL) 100 MG 24 hr tablet Take 1 tablet (100 mg total) by mouth daily. 30 tablet 2  . [DISCONTINUED] pravastatin (PRAVACHOL) 40 MG tablet Take 40 mg by mouth daily.      . [DISCONTINUED] risperiDONE (RISPERDAL) 2 MG tablet Take 2 mg by mouth at bedtime.       No current facility-administered medications on file prior to visit.   Family History  Problem Relation Age of Onset  . Depression Mother   . Hypertension Mother   . Diabetes Mother   . Hypertension Father   . Hyperlipidemia Father   . Depression Sister    History   Social History  . Marital Status: Divorced    Spouse Name: N/A    Number of Children: N/A  . Years of Education: N/A   Occupational History  . Not on file.    Social History Main Topics  . Smoking status: Former Smoker -- 0.25 packs/day for 19 years    Types: Cigarettes  . Smokeless tobacco: Never Used  . Alcohol Use: Yes     Comment: "quit alcohol ~ 2005; only then drank occasionl wine cooler"  . Drug Use: Yes    Special: Cocaine, Marijuana, "Crack" cocaine     Comment: "LAST DRUG USE WAS IN 2005"  . Sexual Activity: Not Currently     Comment: "quit smoking cigarettes ~2005"   Other Topics Concern  . Not on file   Social History Narrative    Review of Systems: See HPI  Objective:   Filed Vitals:   07/01/14 1205  BP: 133/86  Pulse: 83  Temp: 98.2 F (36.8 C)  Resp: 16    .Physical Exam  Constitutional: She is oriented to person, place, and time.  Cardiovascular: Normal rate, regular rhythm and normal heart sounds.   Pulmonary/Chest: Effort normal and breath sounds normal.  Musculoskeletal: She exhibits edema and tenderness.  Neurological: She is alert and oriented to person, place, and time.  Skin: Skin is warm and dry.     Psychiatric: She has a normal mood and affect.     Lab Results  Component Value Date   WBC 9.1 05/02/2014   HGB 13.7 05/02/2014   HCT 41.9 05/02/2014   MCV 85.9 05/02/2014   PLT 307 05/02/2014   Lab Results  Component Value Date   CREATININE 0.69 05/02/2014   BUN 7 05/02/2014   NA 140 05/02/2014   K 3.6* 05/02/2014   CL 101 05/02/2014   CO2 25 05/02/2014    Lab Results  Component Value Date   HGBA1C 5.60 07/01/2014   Lipid Panel     Component Value Date/Time   CHOL 184 07/31/2013 1558   TRIG 120 07/31/2013 1558   HDL 32* 07/31/2013 1558   CHOLHDL 5.8 07/31/2013 1558   VLDL 24 07/31/2013 1558   LDLCALC 128* 07/31/2013 1558       Assessment and plan:   Beth [name] was seen today for venous stasis ulcer.  Diagnoses and associated orders for this visit:  Type 2 diabetes mellitus without complication - HgB A1c - Glucose (CBG) - Lipid panel - Microalbumin, urine Patients  diabetes is well control as evidence by consistently low a1c.  Patient will continue with current therapy and continue to make necessary lifestyle changes.  Reviewed foot care, diet, exercise, annual health maintenance with patient.   Venous stasis ulcer, left - meloxicam (MOBIC) 15 MG tablet; Take 1 tablet (15 mg total) by mouth daily as needed for pain.  Take with food - traMADol (ULTRAM) 50 MG tablet; Take 1 tablet (50 mg total) by mouth every 8 (eight) hours as needed. - CBC  Skin hypopigmentation - Ambulatory referral to Dermatology   Return in about 3 months (around 09/30/2014) for DM/HTN.      Holland Commons, NP-C [facility] [phone] 07/01/2014, 12:35 PM

## 2014-07-02 LAB — LIPID PANEL
Cholesterol: 254 mg/dL — ABNORMAL HIGH (ref 0–200)
HDL: 31 mg/dL — ABNORMAL LOW (ref >39)
LDL Cholesterol: 196 mg/dL — ABNORMAL HIGH (ref 0–99)
Total CHOL/HDL Ratio: 8.2 Ratio
Triglycerides: 136 mg/dL (ref <150)
VLDL: 27 mg/dL (ref 0–40)

## 2014-07-02 LAB — MICROALBUMIN, URINE: MICROALB UR: 11.9 mg/dL — AB (ref <2.0)

## 2014-07-05 ENCOUNTER — Other Ambulatory Visit: Payer: Self-pay | Admitting: Internal Medicine

## 2014-07-07 NOTE — Telephone Encounter (Signed)
Please call patient and find out why she is on zoloft and cymbalta. Does she have a regular psychiatrist, such as [facility]? They need to review her medications

## 2014-07-14 ENCOUNTER — Telehealth: Payer: Self-pay | Admitting: *Deleted

## 2014-07-14 MED ORDER — ATORVASTATIN CALCIUM 20 MG PO TABS: 20.0000 mg | ORAL_TABLET | Freq: Every day | ORAL | Status: DC

## 2014-07-14 NOTE — Telephone Encounter (Signed)
-----   Message from Ambrose FinlandValerie A Keck, NP sent at 07/13/2014 10:58 PM EST ----- Please increase atorvastatin to 20 mg QD. Please stress to patient the importance of cholesterol in diabetes and how she is at a much higher risk for stroke and cvd. Go over starches, sugars, butters, oils, fried foods

## 2014-07-14 NOTE — Telephone Encounter (Signed)
Pt aware  [facility]

## 2014-07-19 ENCOUNTER — Ambulatory Visit: Payer: Self-pay | Attending: Internal Medicine

## 2014-08-04 ENCOUNTER — Other Ambulatory Visit: Payer: Self-pay | Admitting: Internal Medicine

## 2014-09-24 ENCOUNTER — Inpatient Hospital Stay (HOSPITAL_COMMUNITY)
Admission: EM | Admit: 2014-09-24 | Discharge: 2014-09-28 | DRG: 202 | Disposition: A | Discharge status: Home / Self Care | Payer: Medicaid Other | Attending: Internal Medicine | Admitting: Internal Medicine

## 2014-09-24 ENCOUNTER — Emergency Department (HOSPITAL_COMMUNITY): Payer: Medicaid Other

## 2014-09-24 ENCOUNTER — Encounter (HOSPITAL_COMMUNITY): Payer: Self-pay | Admitting: Emergency Medicine

## 2014-09-24 DIAGNOSIS — M1990 Unspecified osteoarthritis, unspecified site: Secondary | ICD-10-CM | POA: Diagnosis present

## 2014-09-24 DIAGNOSIS — Z87891 Personal history of nicotine dependence: Secondary | ICD-10-CM

## 2014-09-24 DIAGNOSIS — J209 Acute bronchitis, unspecified: Secondary | ICD-10-CM | POA: Diagnosis present

## 2014-09-24 DIAGNOSIS — K219 Gastro-esophageal reflux disease without esophagitis: Secondary | ICD-10-CM | POA: Diagnosis present

## 2014-09-24 DIAGNOSIS — F32A Depression, unspecified: Secondary | ICD-10-CM | POA: Diagnosis present

## 2014-09-24 DIAGNOSIS — I517 Cardiomegaly: Secondary | ICD-10-CM

## 2014-09-24 DIAGNOSIS — F329 Major depressive disorder, single episode, unspecified: Secondary | ICD-10-CM | POA: Diagnosis present

## 2014-09-24 DIAGNOSIS — G4733 Obstructive sleep apnea (adult) (pediatric): Secondary | ICD-10-CM | POA: Diagnosis present

## 2014-09-24 DIAGNOSIS — I10 Essential (primary) hypertension: Secondary | ICD-10-CM | POA: Diagnosis present

## 2014-09-24 DIAGNOSIS — Z8672 Personal history of thrombophlebitis: Secondary | ICD-10-CM

## 2014-09-24 DIAGNOSIS — J45901 Unspecified asthma with (acute) exacerbation: Secondary | ICD-10-CM | POA: Diagnosis present

## 2014-09-24 DIAGNOSIS — E1165 Type 2 diabetes mellitus with hyperglycemia: Secondary | ICD-10-CM | POA: Diagnosis present

## 2014-09-24 DIAGNOSIS — Z6841 Body Mass Index (BMI) 40.0 and over, adult: Secondary | ICD-10-CM | POA: Diagnosis not present

## 2014-09-24 DIAGNOSIS — Z79899 Other long term (current) drug therapy: Secondary | ICD-10-CM | POA: Diagnosis not present

## 2014-09-24 DIAGNOSIS — I422 Other hypertrophic cardiomyopathy: Secondary | ICD-10-CM | POA: Diagnosis present

## 2014-09-24 DIAGNOSIS — Z9049 Acquired absence of other specified parts of digestive tract: Secondary | ICD-10-CM | POA: Diagnosis present

## 2014-09-24 DIAGNOSIS — IMO0002 Reserved for concepts with insufficient information to code with codable children: Secondary | ICD-10-CM | POA: Insufficient documentation

## 2014-09-24 DIAGNOSIS — E785 Hyperlipidemia, unspecified: Secondary | ICD-10-CM | POA: Diagnosis present

## 2014-09-24 DIAGNOSIS — G629 Polyneuropathy, unspecified: Secondary | ICD-10-CM

## 2014-09-24 DIAGNOSIS — I2510 Atherosclerotic heart disease of native coronary artery without angina pectoris: Secondary | ICD-10-CM | POA: Diagnosis present

## 2014-09-24 DIAGNOSIS — Z713 Dietary counseling and surveillance: Secondary | ICD-10-CM | POA: Diagnosis present

## 2014-09-24 DIAGNOSIS — Z7951 Long term (current) use of inhaled steroids: Secondary | ICD-10-CM

## 2014-09-24 DIAGNOSIS — F419 Anxiety disorder, unspecified: Secondary | ICD-10-CM | POA: Diagnosis present

## 2014-09-24 DIAGNOSIS — E119 Type 2 diabetes mellitus without complications: Secondary | ICD-10-CM

## 2014-09-24 DIAGNOSIS — R0602 Shortness of breath: Secondary | ICD-10-CM | POA: Diagnosis present

## 2014-09-24 DIAGNOSIS — J4541 Moderate persistent asthma with (acute) exacerbation: Secondary | ICD-10-CM

## 2014-09-24 LAB — CBC
HEMATOCRIT: 38.8 % (ref 36.0–46.0)
HEMOGLOBIN: 12.5 g/dL (ref 12.0–15.0)
MCH: 28.1 pg (ref 26.0–34.0)
MCHC: 32.2 g/dL (ref 30.0–36.0)
MCV: 87.2 fL (ref 78.0–100.0)
Platelets: 310 10*3/uL (ref 150–400)
RBC: 4.45 MIL/uL (ref 3.87–5.11)
RDW: 14.4 % (ref 11.5–15.5)
WBC: 10 10*3/uL (ref 4.0–10.5)

## 2014-09-24 LAB — LIPID PANEL
CHOLESTEROL: 253 mg/dL — AB (ref 0–200)
HDL: 28 mg/dL — ABNORMAL LOW (ref >39)
LDL Cholesterol: 191 mg/dL — ABNORMAL HIGH (ref 0–99)
Total CHOL/HDL Ratio: 9 RATIO
Triglycerides: 170 mg/dL — ABNORMAL HIGH (ref <150)
VLDL: 34 mg/dL (ref 0–40)

## 2014-09-24 LAB — BASIC METABOLIC PANEL
ANION GAP: 5 (ref 5–15)
BUN: <5 mg/dL — ABNORMAL LOW (ref 6–23)
CO2: 30 mmol/L (ref 19–32)
Calcium: 9.1 mg/dL (ref 8.4–10.5)
Chloride: 104 mmol/L (ref 96–112)
Creatinine, Ser: 0.83 mg/dL (ref 0.50–1.10)
GFR calc Af Amer: >90 mL/min (ref >90)
GFR, EST NON AFRICAN AMERICAN: 81 mL/min — AB (ref >90)
Glucose, Bld: 130 mg/dL — ABNORMAL HIGH (ref 70–99)
POTASSIUM: 3.6 mmol/L (ref 3.5–5.1)
SODIUM: 139 mmol/L (ref 135–145)

## 2014-09-24 LAB — TROPONIN I: Troponin I: <0.03 ng/mL (ref <0.031)

## 2014-09-24 LAB — BRAIN NATRIURETIC PEPTIDE: B Natriuretic Peptide: 16.2 pg/mL (ref 0.0–100.0)

## 2014-09-24 LAB — MRSA PCR SCREENING: MRSA BY PCR: NEGATIVE

## 2014-09-24 MED ORDER — MOMETASONE FURO-FORMOTEROL FUM 100-5 MCG/ACT IN AERO
2.0000 | INHALATION_SPRAY | Freq: Two times a day (BID) | RESPIRATORY_TRACT | Status: DC
  Administered 2014-09-25 – 2014-09-28 (×7): 2 via RESPIRATORY_TRACT
  Filled 2014-09-24: qty 8.8

## 2014-09-24 MED ORDER — DULOXETINE HCL 60 MG PO CPEP
60.0000 mg | ORAL_CAPSULE | Freq: Every day | ORAL | Status: DC
  Administered 2014-09-25 – 2014-09-28 (×4): 60 mg via ORAL
  Filled 2014-09-24 (×4): qty 1

## 2014-09-24 MED ORDER — ENOXAPARIN SODIUM 80 MG/0.8ML ~~LOC~~ SOLN
65.0000 mg | SUBCUTANEOUS | Status: DC
  Filled 2014-09-24: qty 0.8

## 2014-09-24 MED ORDER — FLUTICASONE PROPIONATE 50 MCG/ACT NA SUSP
1.0000 | Freq: Every day | NASAL | Status: DC
  Administered 2014-09-25 – 2014-09-28 (×4): 1 via NASAL
  Filled 2014-09-24: qty 16

## 2014-09-24 MED ORDER — HYDROCODONE-ACETAMINOPHEN 5-325 MG PO TABS: 1.0000 | ORAL_TABLET | ORAL | Status: DC | PRN

## 2014-09-24 MED ORDER — TRAMADOL HCL 50 MG PO TABS: 50.0000 mg | ORAL_TABLET | Freq: Three times a day (TID) | ORAL | Status: DC | PRN

## 2014-09-24 MED ORDER — IPRATROPIUM-ALBUTEROL 0.5-2.5 (3) MG/3ML IN SOLN
3.0000 mL | Freq: Once | RESPIRATORY_TRACT | Status: AC
  Administered 2014-09-24: 3 mL via RESPIRATORY_TRACT

## 2014-09-24 MED ORDER — IPRATROPIUM-ALBUTEROL 0.5-2.5 (3) MG/3ML IN SOLN
3.0000 mL | Freq: Four times a day (QID) | RESPIRATORY_TRACT | Status: DC
  Administered 2014-09-25 – 2014-09-27 (×10): 3 mL via RESPIRATORY_TRACT
  Filled 2014-09-24 (×10): qty 3

## 2014-09-24 MED ORDER — INSULIN ASPART 100 UNIT/ML ~~LOC~~ SOLN
0.0000 [IU] | SUBCUTANEOUS | Status: DC
  Administered 2014-09-25: 3 [IU] via SUBCUTANEOUS

## 2014-09-24 MED ORDER — SERTRALINE HCL 100 MG PO TABS
100.0000 mg | ORAL_TABLET | Freq: Every day | ORAL | Status: DC
  Administered 2014-09-25 – 2014-09-28 (×4): 100 mg via ORAL
  Filled 2014-09-24 (×4): qty 1

## 2014-09-24 MED ORDER — IPRATROPIUM-ALBUTEROL 0.5-2.5 (3) MG/3ML IN SOLN
RESPIRATORY_TRACT | Status: AC
  Filled 2014-09-24: qty 3

## 2014-09-24 MED ORDER — ATENOLOL 25 MG PO TABS
25.0000 mg | ORAL_TABLET | Freq: Every day | ORAL | Status: DC
  Administered 2014-09-25: 25 mg via ORAL
  Filled 2014-09-24: qty 1

## 2014-09-24 MED ORDER — ASPIRIN 81 MG PO CHEW
324.0000 mg | CHEWABLE_TABLET | Freq: Once | ORAL | Status: AC
  Administered 2014-09-24: 324 mg via ORAL
  Filled 2014-09-24: qty 4

## 2014-09-24 MED ORDER — METHYLPREDNISOLONE SODIUM SUCC 125 MG IJ SOLR
80.0000 mg | INTRAMUSCULAR | Status: DC
  Administered 2014-09-25: 80 mg via INTRAVENOUS
  Filled 2014-09-24: qty 1.28

## 2014-09-24 MED ORDER — IPRATROPIUM-ALBUTEROL 0.5-2.5 (3) MG/3ML IN SOLN
3.0000 mL | Freq: Once | RESPIRATORY_TRACT | Status: AC
  Administered 2014-09-24: 3 mL via RESPIRATORY_TRACT
  Filled 2014-09-24: qty 3

## 2014-09-24 MED ORDER — MELOXICAM 15 MG PO TABS
15.0000 mg | ORAL_TABLET | Freq: Every day | ORAL | Status: DC | PRN
  Filled 2014-09-24: qty 1

## 2014-09-24 MED ORDER — PROMETHAZINE HCL 25 MG PO TABS
25.0000 mg | ORAL_TABLET | Freq: Four times a day (QID) | ORAL | Status: DC | PRN
  Administered 2014-09-26: 25 mg via ORAL
  Filled 2014-09-24: qty 1

## 2014-09-24 MED ORDER — ARIPIPRAZOLE 10 MG PO TABS
20.0000 mg | ORAL_TABLET | Freq: Every day | ORAL | Status: DC
  Administered 2014-09-25 – 2014-09-28 (×4): 20 mg via ORAL
  Filled 2014-09-24 (×4): qty 2

## 2014-09-24 MED ORDER — GABAPENTIN 600 MG PO TABS
600.0000 mg | ORAL_TABLET | Freq: Three times a day (TID) | ORAL | Status: DC
  Administered 2014-09-24 – 2014-09-28 (×11): 600 mg via ORAL
  Filled 2014-09-24 (×13): qty 1

## 2014-09-24 MED ORDER — HYDROCODONE-ACETAMINOPHEN 5-325 MG PO TABS
1.0000 | ORAL_TABLET | Freq: Once | ORAL | Status: AC
  Administered 2014-09-24: 1 via ORAL
  Filled 2014-09-24: qty 1

## 2014-09-24 MED ORDER — PREDNISONE 20 MG PO TABS
40.0000 mg | ORAL_TABLET | Freq: Once | ORAL | Status: AC
  Administered 2014-09-24: 40 mg via ORAL
  Filled 2014-09-24: qty 2

## 2014-09-24 MED ORDER — ASPIRIN EC 81 MG PO TBEC
81.0000 mg | DELAYED_RELEASE_TABLET | Freq: Every day | ORAL | Status: DC
  Administered 2014-09-25 – 2014-09-28 (×4): 81 mg via ORAL
  Filled 2014-09-24 (×4): qty 1

## 2014-09-24 MED ORDER — MONTELUKAST SODIUM 10 MG PO TABS
10.0000 mg | ORAL_TABLET | Freq: Every day | ORAL | Status: DC
  Administered 2014-09-24 – 2014-09-27 (×4): 10 mg via ORAL
  Filled 2014-09-24 (×5): qty 1

## 2014-09-24 MED ORDER — ATORVASTATIN CALCIUM 20 MG PO TABS
20.0000 mg | ORAL_TABLET | Freq: Every day | ORAL | Status: DC
  Filled 2014-09-24: qty 1

## 2014-09-24 MED ORDER — ALBUTEROL SULFATE (2.5 MG/3ML) 0.083% IN NEBU
2.5000 mg | INHALATION_SOLUTION | RESPIRATORY_TRACT | Status: AC | PRN
  Administered 2014-09-24: 2.5 mg via RESPIRATORY_TRACT
  Filled 2014-09-24: qty 3

## 2014-09-24 NOTE — H&P (Signed)
[facility] History and Physical  Beth [name] OZH:[id number] DOB: [birth year] DOA: 09/24/2014  Referring physician: PCP: Jeanann Lewandowsky, MD  Specialists:   Chief Complaint: Asthma exacerbation  HPI: Beth [name] is a 49 y.o. BF PMHx  depression, anxiety, diabetes type 2 uncontrolled, HTN, HLD, chronic asthmatic bronchitis, OSA on CPAP.   She sick for the last 3 days with gradually increasing difficulty breathing and a cough which is nonproductive. There is soreness in her chest with coughing but no other chest pain. She denies fever, chills, sweats she denies nausea or vomiting. She has not been anything to treat her symptoms at home. She does admit to a history of asthma. Patient states she is hospitalized at least once per year for asthma exacerbation, uses her rescue inhaler minimum 2 times per day, wakes up at night often short of breath requiring rescue inhaler. States [facility]. Currently states feels better than at initial admission in the ED    Review of Systems: The patient denies anorexia, fever, weight loss,, vision loss, decreased hearing, hoarseness,  peripheral edema, balance deficits, hemoptysis, abdominal pain, melena, hematochezia, severe indigestion/heartburn, hematuria, incontinence, genital sores, muscle weakness, suspicious skin lesions, transient blindness, difficulty walking,unusual weight change, abnormal bleeding, enlarged lymph nodes, angioedema, and breast masses.    TRAVEL HISTORY:  NA  Consultants:  NA  Procedure/Significant Events:  08/17/13 echocardiogram;- Left ventricle: mild LVH. -Systolic function 50%-55%.  -(grade 1 diastolic dysfunction).- Left atrium: mildly dilated. 4/1 CXR;Congestive heart failure pulmonary interstitial edema   Culture  4/1 influenza panel pending 4/1 respiratory virus panel pending 4/1 sputum pending   Antibiotics:  NA  DVT prophylaxis:  Lovenox  Devices      LINES / TUBES:     Past Medical History  Diagnosis Date  . Hypertension   . Asthma   . Coronary artery disease   . Hyperlipidemia   . Phlebitis   . Asthmatic bronchitis , chronic   . Depression   . Anxiety   . Type II diabetes mellitus   . History of blood transfusion     "related to nose would not stop bleeding"  . GERD (gastroesophageal reflux disease)   . Arthritis     "knees" (12/14/2013)   Past Surgical History  Procedure Laterality Date  . Hernia repair    . Umbilical hernia repair  05/2004  . Cholecystectomy    . Dilation and curettage of uterus    . Hysteroscopy w/d&c N/A 08/04/2013    Procedure: DILATATION AND CURETTAGE /HYSTEROSCOPY;  Surgeon: Adam Phenix, MD;  Location: [facility];  Service: Gynecology;  Laterality: N/A;   Social History:  reports that she has quit smoking. Her smoking use included Cigarettes. She has a 4.75 pack-year smoking history. She has never used smokeless tobacco. She reports that she drinks alcohol. She reports that she uses illicit drugs (Cocaine, Marijuana, and "Crack" cocaine).   No Known Allergies  Family History  Problem Relation Age of Onset  . Depression Mother   . Hypertension Mother   . Diabetes Mother   . Hypertension Father   . Hyperlipidemia Father   . Depression Sister      Prior to Admission medications   Medication Sig Start Date End Date Taking? Authorizing Provider  ARIPiprazole (ABILIFY) 20 MG tablet Take 1 tablet (20 mg total) by mouth daily. 09/16/13  Yes Quentin Angst, MD  atenolol (TENORMIN) 25 MG tablet TAKE 1 TABLET BY MOUTH ONCE  DAILY 06/22/14  Yes Ambrose FinlandValerie A Keck, NP  atorvastatin (LIPITOR) 20 MG tablet Take 1 tablet (20 mg total) by mouth daily. 07/14/14  Yes Ambrose FinlandValerie A Keck, NP  DULoxetine (CYMBALTA) 60 MG capsule Take 1 capsule (60 mg total) by mouth daily. 03/05/14  Yes Quentin Angstlugbemiga E Jegede, MD  fluticasone (FLONASE) 50 MCG/ACT nasal spray Place 1 spray into both nostrils daily. 06/01/14  Yes  Ambrose FinlandValerie A Keck, NP  Fluticasone-Salmeterol (ADVAIR) 250-50 MCG/DOSE AEPB Inhale 1 puff into the lungs every 12 (twelve) hours. For shortness of breath 09/02/13  Yes Olugbemiga E Hyman HopesJegede, MD  gabapentin (NEURONTIN) 600 MG tablet Take 1 tablet (600 mg total) by mouth 3 (three) times daily. 04/30/13  Yes Alison MurrayAlma M Devine, MD  hydrochlorothiazide (HYDRODIURIL) 25 MG tablet TAKE 1 TABLET BY MOUTH ONCE DAILY 06/22/14  Yes Ambrose FinlandValerie A Keck, NP  HYDROcodone-acetaminophen (NORCO/VICODIN) 5-325 MG per tablet Take 1 tablet by mouth every 4 (four) hours as needed for moderate pain or severe pain. 05/02/14  Yes Trixie DredgeEmily West, PA-C  loratadine (CLARITIN) 10 MG tablet Take 1 tablet (10 mg total) by mouth daily. 04/30/13  Yes Alison MurrayAlma M Devine, MD  meloxicam (MOBIC) 15 MG tablet Take 1 tablet (15 mg total) by mouth daily as needed for pain. Take with food 07/01/14  Yes Ambrose FinlandValerie A Keck, NP  promethazine (PHENERGAN) 25 MG tablet Take 1 tablet (25 mg total) by mouth every 6 (six) hours as needed for nausea or vomiting. 05/02/14  Yes Trixie DredgeEmily West, PA-C  sertraline (ZOLOFT) 100 MG tablet Take 1 tablet (100 mg total) by mouth daily. 04/30/13  Yes Alison MurrayAlma M Devine, MD  traMADol (ULTRAM) 50 MG tablet Take 1 tablet (50 mg total) by mouth every 8 (eight) hours as needed. Patient taking differently: Take 50 mg by mouth every 8 (eight) hours as needed for moderate pain.  07/01/14  Yes Ambrose FinlandValerie A Keck, NP  VENTOLIN HFA 108 (90 BASE) MCG/ACT inhaler INHALE 2 PUFFS INTO THE LUNGS EVERY 4 HOURS AS NEEDED FOR WHEEZING OR SHORTNESS OF BREATH. (MAP) 06/22/14  Yes Quentin Angstlugbemiga E Jegede, MD  hydrochlorothiazide (HYDRODIURIL) 12.5 MG tablet Take 2 tablets (25 mg total) by mouth daily. Patient not taking: Reported on 09/24/2014 04/30/13   Alison MurrayAlma M Devine, MD  sulfamethoxazole-trimethoprim [facility](SEPTRA DS) 800-160 MG per tablet Take 2 tablets by mouth 2 (two) times daily. Patient not taking: Reported on 09/24/2014 06/21/14   Rodolph BongEvan S Corey, MD   Physical Exam: Filed Vitals:    09/24/14 1912 09/24/14 1930 09/24/14 2000 09/24/14 2108  BP: 122/78 147/78 135/87 132/89  Pulse: 92 88  87  Temp:    98.2 F (36.8 C)  TempSrc:    Oral  Resp: 18 15 12 22   Height:      Weight:      SpO2: 93% 95% 100% 94%     General:  A/O 4, acute on chronic respiratory distress  Eyes: Pupils equal round reactive to light and accommodation  Neck: Negative JVD, negative lymphadenopathy  Cardiovascular: Tachycardic, regular rhythm, negative murmurs rubs or gallops, normal S1/S2  Respiratory: Diffuse inspiratory and expiratory wheezing, negative crackles  Abdomen: Morbidly obese, soft, nontender, plus bowel sound  Musculoskeletal: Moves all extremities    Labs on Admission:  Basic Metabolic Panel:  Recent Labs Lab 09/24/14 1505  NA 139  K 3.6  CL 104  CO2 30  GLUCOSE 130*  BUN <5*  CREATININE 0.83  CALCIUM 9.1   Liver Function Tests: No results for input(s): AST, ALT, ALKPHOS, BILITOT,  PROT, ALBUMIN in the last 168 hours. No results for input(s): LIPASE, AMYLASE in the last 168 hours. No results for input(s): AMMONIA in the last 168 hours. CBC:  Recent Labs Lab 09/24/14 1505  WBC 10.0  HGB 12.5  HCT 38.8  MCV 87.2  PLT 310   Cardiac Enzymes:  Recent Labs Lab 09/24/14 1627  TROPONINI <0.03    BNP (last 3 results)  Recent Labs  09/24/14 1627  BNP 16.2    ProBNP (last 3 results)  Recent Labs  12/13/13 2309  PROBNP 18.3    CBG: No results for input(s): GLUCAP in the last 168 hours.  Radiological Exams on Admission: Dg Chest 2 View (if Patient Has Fever And/or Copd)  09/24/2014   CLINICAL DATA:  Shortness of breath.  EXAM: CHEST  2 VIEW  COMPARISON:  12/13/2013.  FINDINGS: Mediastinum hilar structures normal. Cardiomegaly pulmonary vascular prominence and interstitial prominence consistent congestive heart failure noted. No pleural effusion or pneumothorax. Degenerative changes thoracic spine .  IMPRESSION: Congestive heart failure  pulmonary interstitial edema.   Electronically Signed   By: Maisie Fus  Register   On: 09/24/2014 15:38    EKG:    Assessment/Plan Active Problems:   Depression   Neuropathy   DM type 2 (diabetes mellitus, type 2)   HTN (hypertension)   Acute asthma exacerbation   Asthma exacerbation   OSA on CPAP   HLD (hyperlipidemia)   Morbid obesity  Asthma exacerbation -Patient with poorly controlled asthma at baseline, will hold antibiotics for now with negative leukocytosis, negative fever -DuoNeb  QID -Solu-Medrol 80 mg daily -Singulair daily -Dulera substituted for Advair 250-50 -Respiratory virus panel pending -Influenza panel pending -Sputum culture pending  OSA on CPAP -CPAP per respiratory  HTN -Atenolol 25 mg daily -Lisinopril 5 mg daily  HLD -Not within ADA guidelines -Increase Lipitor 40 mg daily  Diabetes type 2 uncontrolled -Moderate SSI -Hemoglobin A1c pending  Depression -Continue Zoloft 100 mg daily -Continue Cymbalta 60 mg daily -Continue Abilify 20 mg daily    Code Status: Full Family Communication: None  Disposition Plan: Resolution asthma exacerbation  Time spent: 60 minutes  Cezar Misiaszek J [facility] Pager [phone]  If 7PM-7AM, please contact night-coverage [email or web address] Password [facility] 09/24/2014, 10:17 PM

## 2014-09-24 NOTE — Progress Notes (Signed)
Yehuda Buddamela M Groom [id number] Admission Data: 09/24/2014 11:25 PM Attending Provider: Drema Dallasurtis J Woods, MD WUJ:[id number], Keane ScrapeLUGBEMIGA, MD Code Status: Full  Yehuda Buddamela M Goldsmith is a 49 y.o. female patient admitted from ED:  -No acute distress noted.  -No complaints of shortness of breath.  -No complaints of chest pain.   Cardiac Monitoring: Box #  in place. Cardiac monitor yields:n/a.  Blood pressure 132/89, pulse 87, temperature 98.2 F (36.8 C), temperature source Oral, resp. rate 22, height 5\' 8"  (1.727 m), weight 136.079 kg (300 lb), last menstrual period 03/27/2014, SpO2 94 %.   IV Fluids:  IV in place, occlusive dsg intact without redness, IV cath antecubital right, condition patent and no redness none.   Allergies:  Review of patient's allergies indicates no known allergies.  Past Medical History:   has a past medical history of Hypertension; Asthma; Coronary artery disease; Hyperlipidemia; Phlebitis; Asthmatic bronchitis , chronic; Depression; Anxiety; Type II diabetes mellitus; History of blood transfusion; GERD (gastroesophageal reflux disease); and Arthritis.  Past Surgical History:   has past surgical history that includes Hernia repair; Umbilical hernia repair (05/2004); Cholecystectomy; Dilation and curettage of uterus; and Hysteroscopy w/D&C (N/A, 08/04/2013).  Social History:   reports that she has quit smoking. Her smoking use included Cigarettes. She has a 4.75 pack-year smoking history. She has never used smokeless tobacco. She reports that she drinks alcohol. She reports that she uses illicit drugs (Cocaine, Marijuana, and "Crack" cocaine).  Skin: charted on [facility]  Patient/Family orientated to room. Information packet given to patient/family. Admission inpatient armband information verified with patient/family to include name and date of birth and placed on patient arm. Side rails up x 2, fall assessment and education completed with patient/family. Patient/family able to verbalize  understanding of risk associated with falls and verbalized understanding to call for assistance before getting out of bed. Call light within reach. Patient/family able to voice and demonstrate understanding of unit orientation instructions.

## 2014-09-24 NOTE — ED Notes (Signed)
Dinner tray ordered for the pt.

## 2014-09-24 NOTE — ED Provider Notes (Signed)
CSN: [id number]     Arrival date & time 09/24/14  1439 History   First MD Initiated Contact with Patient 09/24/14 1558     Chief Complaint  Patient presents with  . Respiratory Distress  . Asthma     (Consider location/radiation/quality/duration/timing/severity/associated sxs/prior Treatment) Patient is a 49 y.o. female presenting with asthma. The history is provided by the patient.  Asthma  She sick for the last 3 days with gradually increasing difficulty breathing and a cough which is nonproductive. There is soreness in her chest with coughing but no other chest pain. She denies fever, chills, sweats she denies nausea or vomiting. She has not been anything to treat her symptoms at home. She does admit to a history of asthma.  Past Medical History  Diagnosis Date  . Hypertension   . Asthma   . Coronary artery disease   . Hyperlipidemia   . Phlebitis   . Asthmatic bronchitis , chronic   . Depression   . Anxiety   . Type II diabetes mellitus   . History of blood transfusion     "related to nose would not stop bleeding"  . GERD (gastroesophageal reflux disease)   . Arthritis     "knees" (12/14/2013)   Past Surgical History  Procedure Laterality Date  . Hernia repair    . Umbilical hernia repair  05/2004  . Cholecystectomy    . Dilation and curettage of uterus    . Hysteroscopy w/d&c N/A 08/04/2013    Procedure: DILATATION AND CURETTAGE /HYSTEROSCOPY;  Surgeon: Adam Phenix, MD;  Location: [facility];  Service: Gynecology;  Laterality: N/A;   Family History  Problem Relation Age of Onset  . Depression Mother   . Hypertension Mother   . Diabetes Mother   . Hypertension Father   . Hyperlipidemia Father   . Depression Sister    History  Substance Use Topics  . Smoking status: Former Smoker -- 0.25 packs/day for 19 years    Types: Cigarettes  . Smokeless tobacco: Never Used  . Alcohol Use: Yes     Comment: "quit alcohol ~ 2005; only then drank occasionl wine cooler"    OB History    Gravida Para Term Preterm AB TAB SAB Ectopic Multiple Living       Review of Systems  All other systems reviewed and are negative.     Allergies  Review of patient's allergies indicates no known allergies.  Home Medications   Prior to Admission medications   Medication Sig Start Date End Date Taking? Authorizing Provider  ARIPiprazole (ABILIFY) 20 MG tablet Take 1 tablet (20 mg total) by mouth daily. 09/16/13  Yes Quentin Angst, MD  atenolol (TENORMIN) 25 MG tablet TAKE 1 TABLET BY MOUTH ONCE DAILY 06/22/14  Yes Ambrose Finland, NP  atorvastatin (LIPITOR) 20 MG tablet Take 1 tablet (20 mg total) by mouth daily. 07/14/14  Yes Ambrose Finland, NP  DULoxetine (CYMBALTA) 60 MG capsule Take 1 capsule (60 mg total) by mouth daily. 03/05/14  Yes Quentin Angst, MD  fluticasone (FLONASE) 50 MCG/ACT nasal spray Place 1 spray into both nostrils daily. 06/01/14  Yes Ambrose Finland, NP  Fluticasone-Salmeterol (ADVAIR) 250-50 MCG/DOSE AEPB Inhale 1 puff into the lungs every 12 (twelve) hours. For shortness of breath 09/02/13  Yes Olugbemiga E Hyman Hopes, MD  gabapentin (NEURONTIN) 600 MG tablet Take 1 tablet (600 mg total) by mouth 3 (three) times daily. 04/30/13  Yes Alison MurrayAlma M Devine, MD  hydrochlorothiazide (HYDRODIURIL) 25 MG tablet TAKE 1 TABLET BY MOUTH ONCE DAILY 06/22/14  Yes Ambrose FinlandValerie A Keck, NP  HYDROcodone-acetaminophen (NORCO/VICODIN) 5-325 MG per tablet Take 1 tablet by mouth every 4 (four) hours as needed for moderate pain or severe pain. 05/02/14  Yes Trixie DredgeEmily West, PA-C  loratadine (CLARITIN) 10 MG tablet Take 1 tablet (10 mg total) by mouth daily. 04/30/13  Yes Alison MurrayAlma M Devine, MD  meloxicam (MOBIC) 15 MG tablet Take 1 tablet (15 mg total) by mouth daily as needed for pain. Take with food 07/01/14  Yes Ambrose FinlandValerie A Keck, NP  promethazine (PHENERGAN) 25 MG tablet Take 1 tablet (25 mg total) by mouth every 6 (six) hours as needed for nausea or vomiting. 05/02/14  Yes  Trixie DredgeEmily West, PA-C  sertraline (ZOLOFT) 100 MG tablet Take 1 tablet (100 mg total) by mouth daily. 04/30/13  Yes Alison MurrayAlma M Devine, MD  traMADol (ULTRAM) 50 MG tablet Take 1 tablet (50 mg total) by mouth every 8 (eight) hours as needed. Patient taking differently: Take 50 mg by mouth every 8 (eight) hours as needed for moderate pain.  07/01/14  Yes Ambrose FinlandValerie A Keck, NP  VENTOLIN HFA 108 (90 BASE) MCG/ACT inhaler INHALE 2 PUFFS INTO THE LUNGS EVERY 4 HOURS AS NEEDED FOR WHEEZING OR SHORTNESS OF BREATH. (MAP) 06/22/14  Yes Quentin Angstlugbemiga E Jegede, MD  hydrochlorothiazide (HYDRODIURIL) 12.5 MG tablet Take 2 tablets (25 mg total) by mouth daily. Patient not taking: Reported on 09/24/2014 04/30/13   Alison MurrayAlma M Devine, MD  sulfamethoxazole-trimethoprim [facility](SEPTRA DS) 800-160 MG per tablet Take 2 tablets by mouth 2 (two) times daily. Patient not taking: Reported on 09/24/2014 06/21/14   Rodolph BongEvan S Corey, MD   BP 125/80 mmHg  Pulse 97  Temp(Src) 98.1 F (36.7 C) (Oral)  Resp 23  SpO2 94%  LMP 03/27/2014 Physical Exam  Nursing note and vitals reviewed.  Obese 49 year old female, resting comfortably and in no acute distress. Vital signs are significant for tachypnea. Oxygen saturation is 94%, which is normal. Head is normocephalic and atraumatic. PERRLA, EOMI. Oropharynx is clear. Neck is nontender and supple without adenopathy or JVD. Back is nontender and there is no CVA tenderness. Lungs have bibasilar rales as well as mild expiratory wheezes throughout. Chest is nontender. Heart has regular rate and rhythm without murmur. Abdomen is soft, flat, nontender without masses or hepatosplenomegaly and peristalsis is normoactive. Extremities have trace edema, full range of motion is present. Skin is warm and dry without rash. Neurologic: Mental status is normal, cranial nerves are intact, there are no motor or sensory deficits.  ED Course  Procedures (including critical care time) Labs Review Results for orders placed or  performed during the hospital encounter of 09/24/14  Basic metabolic panel    (if pt has PMH of COPD)  Result Value Ref Range   Sodium 139 135 - 145 mmol/L   Potassium 3.6 3.5 - 5.1 mmol/L   Chloride 104 96 - 112 mmol/L   CO2 30 19 - 32 mmol/L   Glucose, Bld 130 (H) 70 - 99 mg/dL   BUN <5 (L) 6 - 23 mg/dL   Creatinine, Ser 1.610.83 0.50 - 1.10 mg/dL   Calcium 9.1 8.4 - 09.610.5 mg/dL   GFR calc non Af Amer 81 (L) >90 mL/min   GFR calc Af Amer >90 >90 mL/min   Anion gap 5 5 - 15  CBC     (if pt has PMH of COPD)  Result Value Ref Range   WBC 10.0 4.0 - 10.5 K/uL   RBC 4.45 3.87 - 5.11 MIL/uL   Hemoglobin 12.5 12.0 - 15.0 g/dL   HCT 16.1 09.6 - 04.5 %   MCV 87.2 78.0 - 100.0 fL   MCH 28.1 26.0 - 34.0 pg   MCHC 32.2 30.0 - 36.0 g/dL   RDW 40.9 81.1 - 91.4 %   Platelets 310 150 - 400 K/uL  Troponin I  Result Value Ref Range   Troponin I <0.03 <0.031 ng/mL  Brain natriuretic peptide  Result Value Ref Range   B Natriuretic Peptide 16.2 0.0 - 100.0 pg/mL    Imaging Review Dg Chest 2 View (if Patient Has Fever And/or Copd)  09/24/2014   CLINICAL DATA:  Shortness of breath.  EXAM: CHEST  2 VIEW  COMPARISON:  12/13/2013.  FINDINGS: Mediastinum hilar structures normal. Cardiomegaly pulmonary vascular prominence and interstitial prominence consistent congestive heart failure noted. No pleural effusion or pneumothorax. Degenerative changes thoracic spine .  IMPRESSION: Congestive heart failure pulmonary interstitial edema.   Electronically Signed   By: Maisie Fus  Register   On: 09/24/2014 15:38    ECG shows normal sinus rhythm with a rate of 90 with occasional PVC, no ectopy. Normal axis. Normal P wave. Left ventricular hypertrophy present. Borderline prolonged QT interval. Minor nonspecific T wave changes. Left atrial hypertrophy noted. Compared with ECG of 12/13/2013, PVCs are now present.   MDM   Final diagnoses:  Acute asthma exacerbation, moderate persistent    Dyspnea and cough consistent  with acute bronchitis or possible asthma exacerbation. However, chest x-ray is read by radiologist as more consistent with congestive heart failure. She does have peripheral edema. Will check BNP level. She has noted some improvement in the ED with a breathing treatment with albuterol and ipratropium and this will be repeated. Old records are reviewed and she does have a hospitalization last year for bronchitis and asthma exacerbation. At that time, chest x-ray showed cardiomegaly without evidence of heart failure.  BNP is come back normal. Clinically, this is more of an asthma exacerbation and acute bronchitis than CHF. She's given additional albuterol with ipratropium nebulizer treatments with only modest improvement. Case is discussed with Dr. Joseph Art of [facility] agrees to admit the patient. Of note, she does have history of diabetes. Blood sugar is not elevated very high today, but decision is made to keep her steroid dose relatively low and she was given a dose of prednisone 40 mg in the ED.  Dione Booze, MD 09/24/14 [phone]

## 2014-09-24 NOTE — ED Notes (Signed)
Attempted report 

## 2014-09-24 NOTE — Progress Notes (Signed)
Received pt report from Anna,RN-Ed.

## 2014-09-24 NOTE — ED Notes (Addendum)
To ED via private vehicle from home, with c/o increasing wheezing/shortness of breath-- hx of asthma-- wheezes audible from doorway

## 2014-09-25 DIAGNOSIS — R0600 Dyspnea, unspecified: Secondary | ICD-10-CM

## 2014-09-25 DIAGNOSIS — G629 Polyneuropathy, unspecified: Secondary | ICD-10-CM

## 2014-09-25 DIAGNOSIS — I10 Essential (primary) hypertension: Secondary | ICD-10-CM

## 2014-09-25 DIAGNOSIS — E1165 Type 2 diabetes mellitus with hyperglycemia: Secondary | ICD-10-CM | POA: Insufficient documentation

## 2014-09-25 DIAGNOSIS — J4551 Severe persistent asthma with (acute) exacerbation: Secondary | ICD-10-CM

## 2014-09-25 DIAGNOSIS — F329 Major depressive disorder, single episode, unspecified: Secondary | ICD-10-CM

## 2014-09-25 DIAGNOSIS — G4733 Obstructive sleep apnea (adult) (pediatric): Secondary | ICD-10-CM

## 2014-09-25 DIAGNOSIS — E785 Hyperlipidemia, unspecified: Secondary | ICD-10-CM

## 2014-09-25 DIAGNOSIS — J45901 Unspecified asthma with (acute) exacerbation: Principal | ICD-10-CM

## 2014-09-25 DIAGNOSIS — IMO0002 Reserved for concepts with insufficient information to code with codable children: Secondary | ICD-10-CM | POA: Insufficient documentation

## 2014-09-25 LAB — CBC WITH DIFFERENTIAL/PLATELET
BASOS PCT: 0 % (ref 0–1)
Basophils Absolute: 0 10*3/uL (ref 0.0–0.1)
Eosinophils Absolute: 0 10*3/uL (ref 0.0–0.7)
Eosinophils Relative: 0 % (ref 0–5)
HEMATOCRIT: 37.4 % (ref 36.0–46.0)
Hemoglobin: 12 g/dL (ref 12.0–15.0)
LYMPHS ABS: 1.8 10*3/uL (ref 0.7–4.0)
Lymphocytes Relative: 16 % (ref 12–46)
MCH: 28 pg (ref 26.0–34.0)
MCHC: 32.1 g/dL (ref 30.0–36.0)
MCV: 87.2 fL (ref 78.0–100.0)
MONO ABS: 0.5 10*3/uL (ref 0.1–1.0)
Monocytes Relative: 5 % (ref 3–12)
Neutro Abs: 8.8 10*3/uL — ABNORMAL HIGH (ref 1.7–7.7)
Neutrophils Relative %: 79 % — ABNORMAL HIGH (ref 43–77)
Platelets: 305 10*3/uL (ref 150–400)
RBC: 4.29 MIL/uL (ref 3.87–5.11)
RDW: 14.5 % (ref 11.5–15.5)
WBC: 11.2 10*3/uL — AB (ref 4.0–10.5)

## 2014-09-25 LAB — INFLUENZA PANEL BY PCR (TYPE A & B)
H1N1 flu by pcr: NOT DETECTED
INFLAPCR: NEGATIVE
Influenza B By PCR: NEGATIVE

## 2014-09-25 LAB — COMPREHENSIVE METABOLIC PANEL
ALBUMIN: 3 g/dL — AB (ref 3.5–5.2)
ALT: 52 U/L — AB (ref 0–35)
ANION GAP: 5 (ref 5–15)
AST: 67 U/L — ABNORMAL HIGH (ref 0–37)
Alkaline Phosphatase: 70 U/L (ref 39–117)
BUN: 8 mg/dL (ref 6–23)
CO2: 27 mmol/L (ref 19–32)
CREATININE: 0.84 mg/dL (ref 0.50–1.10)
Calcium: 8.9 mg/dL (ref 8.4–10.5)
Chloride: 104 mmol/L (ref 96–112)
GFR calc Af Amer: >90 mL/min (ref >90)
GFR, EST NON AFRICAN AMERICAN: 80 mL/min — AB (ref >90)
Glucose, Bld: 144 mg/dL — ABNORMAL HIGH (ref 70–99)
Potassium: 4.2 mmol/L (ref 3.5–5.1)
Sodium: 136 mmol/L (ref 135–145)
Total Bilirubin: 0.6 mg/dL (ref 0.3–1.2)
Total Protein: 7.3 g/dL (ref 6.0–8.3)

## 2014-09-25 LAB — BLOOD GAS, ARTERIAL
Acid-Base Excess: 3.1 mmol/L — ABNORMAL HIGH (ref 0.0–2.0)
BICARBONATE: 27.3 meq/L — AB (ref 20.0–24.0)
Drawn by: 430981
FIO2: 0.21 %
O2 SAT: 89.9 %
PATIENT TEMPERATURE: 98.6
PCO2 ART: 42.9 mmHg (ref 35.0–45.0)
PH ART: 7.42 (ref 7.350–7.450)
TCO2: 28.6 mmol/L (ref 0–100)
pO2, Arterial: 62.1 mmHg — ABNORMAL LOW (ref 80.0–100.0)

## 2014-09-25 LAB — GLUCOSE, CAPILLARY
GLUCOSE-CAPILLARY: 146 mg/dL — AB (ref 70–99)
GLUCOSE-CAPILLARY: 177 mg/dL — AB (ref 70–99)
Glucose-Capillary: 123 mg/dL — ABNORMAL HIGH (ref 70–99)
Glucose-Capillary: 159 mg/dL — ABNORMAL HIGH (ref 70–99)
Glucose-Capillary: 211 mg/dL — ABNORMAL HIGH (ref 70–99)

## 2014-09-25 LAB — TSH: TSH: 1.282 u[IU]/mL (ref 0.350–4.500)

## 2014-09-25 LAB — MAGNESIUM: Magnesium: 1.9 mg/dL (ref 1.5–2.5)

## 2014-09-25 MED ORDER — INSULIN ASPART 100 UNIT/ML ~~LOC~~ SOLN: 0.0000 [IU] | Freq: Every day | SUBCUTANEOUS | Status: DC

## 2014-09-25 MED ORDER — INSULIN ASPART 100 UNIT/ML ~~LOC~~ SOLN
0.0000 [IU] | Freq: Three times a day (TID) | SUBCUTANEOUS | Status: DC
  Administered 2014-09-25 (×2): 2 [IU] via SUBCUTANEOUS
  Administered 2014-09-25 – 2014-09-26 (×4): 5 [IU] via SUBCUTANEOUS
  Administered 2014-09-27: 2 [IU] via SUBCUTANEOUS
  Administered 2014-09-27: 3 [IU] via SUBCUTANEOUS
  Administered 2014-09-28 (×2): 2 [IU] via SUBCUTANEOUS

## 2014-09-25 MED ORDER — ENOXAPARIN SODIUM 80 MG/0.8ML ~~LOC~~ SOLN
70.0000 mg | SUBCUTANEOUS | Status: DC
  Administered 2014-09-26 – 2014-09-28 (×3): 70 mg via SUBCUTANEOUS
  Filled 2014-09-25 (×3): qty 0.8

## 2014-09-25 MED ORDER — ATORVASTATIN CALCIUM 40 MG PO TABS
40.0000 mg | ORAL_TABLET | Freq: Every day | ORAL | Status: DC
  Administered 2014-09-25 – 2014-09-28 (×4): 40 mg via ORAL
  Filled 2014-09-25 (×4): qty 1

## 2014-09-25 MED ORDER — METHYLPREDNISOLONE SODIUM SUCC 125 MG IJ SOLR
60.0000 mg | Freq: Two times a day (BID) | INTRAMUSCULAR | Status: DC
  Administered 2014-09-25 – 2014-09-27 (×4): 60 mg via INTRAVENOUS
  Filled 2014-09-25: qty 0.96
  Filled 2014-09-25: qty 2
  Filled 2014-09-25 (×3): qty 0.96

## 2014-09-25 MED ORDER — LISINOPRIL 5 MG PO TABS
5.0000 mg | ORAL_TABLET | Freq: Every day | ORAL | Status: DC
  Administered 2014-09-25: 5 mg via ORAL
  Filled 2014-09-25: qty 1

## 2014-09-25 NOTE — Progress Notes (Signed)
INITIAL NUTRITION ASSESSMENT  DOCUMENTATION CODES Per approved criteria  -Morbid Obesity   INTERVENTION: -Snacks per pt request  -Recommend outpatient consult for DM and Weight management if agreeable  NUTRITION DIAGNOSIS: Unintended weight loss related to reported by food unavailability as evidenced by reported loss of 15#s.   Goal: Pt to meet >/= 90% of their estimated nutrition needs   Monitor:  Oral intake, labs, snack preferences  Reason for Assessment: MST  49 y.o. female  Admitting Dx: <principal problem not specified>  ASSESSMENT: 49 y/o with PMH of depression, anxiety, diabetes type 2 uncontrolled, HTN, HLD, chronic asthmatic bronchitis, OSA on CPAP presented with difficulty breathing and a cough which is nonproductive, wheezing  Pt states that she lost her food stamps and as a result has not had as much food available. She says she has lost some weight due to this (emr does not show recent weight loss).   She reports having had recent bouts of n/v/d. This has since resolved.   She was interested in small snack before bed.   Nutrition Focused Physical Exam: No loss or unable to determine  Height: Ht Readings from Last 1 Encounters:  09/24/14  (1.727 m)    Weight: Wt Readings from Last 1 Encounters:  09/25/14 306 lb 14.4 oz (139.209 kg)    Ideal Body Weight: 140 lbs  % Ideal Body Weight: 219%  Wt Readings from Last 10 Encounters:  09/25/14 306 lb 14.4 oz (139.209 kg)  07/01/14 300 lb (136.079 kg)  05/10/14 299 lb (135.626 kg)  05/02/14 301 lb (136.533 kg)  02/23/14 320 lb (145.151 kg)  12/28/13 311 lb 12.8 oz (141.432 kg)  12/15/13 321 lb 8 oz (145.831 kg)  10/12/13 330 lb (149.687 kg)  09/14/13 331 lb 14.4 oz (150.549 kg)  09/03/13 327 lb 4.8 oz (148.462 kg)    Usual Body Weight: 320-335 lbs  % Usual Body Weight: 91%  BMI:  Body mass index is 46.67 kg/(m^2).  Estimated Nutritional Needs: Kcal: 1500-1600 Protein: 64-76g Pro (1-1.2  g/kg IBW) Fluid: 1.5-1.6 liters  Skin: WDL  Diet Order: Diet Carb Modified Fluid consistency:: Thin; Room service appropriate?: Yes  EDUCATION NEEDS: -No education needs identified at this time   Intake/Output Summary (Last 24 hours) at 09/25/14 1449 Last data filed at 09/25/14 1415  Gross per 24 hour  Intake    222 ml  Output    200 ml  Net     22 ml    Last BM: 3/31   Labs:   Recent Labs Lab 09/24/14 1505 09/25/14 0635  NA 139 136  K 3.6 4.2  CL 104 104  CO2 30 27  BUN <5* 8  CREATININE 0.83 0.84  CALCIUM 9.1 8.9  MG  --  1.9  GLUCOSE 130* 144*    CBG (last 3)   Recent Labs  09/25/14 0037 09/25/14 0816 09/25/14 1202  GLUCAP 159* 123* 146*    Scheduled Meds: . ARIPiprazole  20 mg Oral Daily  . aspirin EC  81 mg Oral Daily  . atorvastatin  40 mg Oral Daily  . DULoxetine  60 mg Oral Daily  . [START ON 09/26/2014] enoxaparin (LOVENOX) injection  70 mg Subcutaneous Q24H  . fluticasone  1 spray Each Nare Daily  . gabapentin  600 mg Oral TID  . insulin aspart  0-15 Units Subcutaneous TID WC  . insulin aspart  0-5 Units Subcutaneous QHS  . ipratropium-albuterol  3 mL Nebulization Q6H  . methylPREDNISolone (SOLU-MEDROL) injection  60 mg Intravenous Q12H  . mometasone-formoterol  2 puff Inhalation BID  . montelukast  10 mg Oral QHS  . sertraline  100 mg Oral Daily    Continuous Infusions:   Past Medical History  Diagnosis Date  . Hypertension   . Asthma   . Coronary artery disease   . Hyperlipidemia   . Phlebitis   . Asthmatic bronchitis , chronic   . Depression   . Anxiety   . Type II diabetes mellitus   . History of blood transfusion     "related to nose would not stop bleeding"  . GERD (gastroesophageal reflux disease)   . Arthritis     "knees" (12/14/2013)    Past Surgical History  Procedure Laterality Date  . Hernia repair    . Umbilical hernia repair  05/2004  . Cholecystectomy    . Dilation and curettage of uterus    .  Hysteroscopy w/d&c N/A 08/04/2013    Procedure: DILATATION AND CURETTAGE /HYSTEROSCOPY;  Surgeon: Adam PhenixJames G Arnold, MD;  Location: [facility];  Service: Gynecology;  Laterality: N/A;   Christophe LouisNathan Lameka Disla RD, LDN Nutrition Pager: [phone] 09/25/2014 2:49 PM

## 2014-09-25 NOTE — Progress Notes (Signed)
Echocardiogram 2D Echocardiogram has been performed.  Estelle GrumblesMyers, Avelino Herren J 09/25/2014, 3:55 PM

## 2014-09-25 NOTE — Progress Notes (Signed)
Pt denies using home CPAP. RT will place pt on as needed per orders.

## 2014-09-25 NOTE — Progress Notes (Signed)
[facility] PROGRESS NOTE  Beth [name] GNF:[id number] DOB: 10/14/1965 DOA: 09/24/2014 PCP: Jeanann LewandowskyJEGEDE, OLUGBEMIGA, MD  Assessment/Plan: 49 y/o with PMH of depression, anxiety, diabetes type 2 uncontrolled, HTN, HLD, chronic asthmatic bronchitis, OSA on CPAP presented with difficulty breathing and a cough which is nonproductive, wheezing -admitted with asthma exacerbation  1.  Asthma exacerbation; CXR: no infiltrates, but some edema; exam still mild wheezing; Patient with poorly controlled asthma at baseline,  -cont IV Solu-Medrol, bronchodilators, oxygen; Respiratory virus panel pending; Influenza panel pending; pend echo  2. OSA on CPAP; CPAP per respiratory 3. HTN; BP is soft; will hold Atenolol 25 mg daily; Lisinopril 5 mg; 4. Diabetes type 2 uncontrolled; cont Moderate SSI; Hemoglobin A1c pending 5. Depression; cont  Zoloft 100 mg, cymbalta 60 mg; Abilify 20 mg daily     Code Status: full Family Communication: d/w patient (indicate person spoken with, relationship, and if by phone, the number) Disposition Plan: home pend clinical improvement    Consultants:  none  Procedures:  none  Antibiotics:  none (indicate start date, and stop date if known)  HPI/Subjective: alert  Objective: Filed Vitals:   09/25/14 0536  BP: 108/66  Pulse: 94  Temp: 97.9 F (36.6 C)  Resp: 18   No intake or output data in the 24 hours ending 09/25/14 1155 Filed Weights   09/24/14 1630 09/24/14 2000 09/25/14 0536  Weight: 136.079 kg (300 lb) 138.801 kg (306 lb) 139.209 kg (306 lb 14.4 oz)    Exam:   General:  alert  Cardiovascular: s1,s2 rrr  Respiratory: few wheezing at bases   Abdomen: soft, obese, nt  Musculoskeletal: no pittign edema   Data Reviewed: Basic Metabolic Panel:  Recent Labs Lab 09/24/14 1505 09/25/14 0635  NA 139 136  K 3.6 4.2  CL 104 104  CO2 30 27  GLUCOSE 130* 144*  BUN <5* 8  CREATININE 0.83 0.84  CALCIUM 9.1 8.9  MG  --  1.9   Liver  Function Tests:  Recent Labs Lab 09/25/14 0635  AST 67*  ALT 52*  ALKPHOS 70  BILITOT 0.6  PROT 7.3  ALBUMIN 3.0*   No results for input(s): LIPASE, AMYLASE in the last 168 hours. No results for input(s): AMMONIA in the last 168 hours. CBC:  Recent Labs Lab 09/24/14 1505 09/25/14 0635  WBC 10.0 11.2*  NEUTROABS  --  8.8*  HGB 12.5 12.0  HCT 38.8 37.4  MCV 87.2 87.2  PLT 310 305   Cardiac Enzymes:  Recent Labs Lab 09/24/14 1627  TROPONINI <0.03   BNP (last 3 results)  Recent Labs  09/24/14 1627  BNP 16.2    ProBNP (last 3 results)  Recent Labs  12/13/13 2309  PROBNP 18.3    CBG:  Recent Labs Lab 09/25/14 0037 09/25/14 0816  GLUCAP 159* 123*    Recent Results (from the past 240 hour(s))  MRSA PCR Screening     Status: None   Collection Time: 09/24/14  9:41 PM  Result Value Ref Range Status   MRSA by PCR NEGATIVE NEGATIVE Final    Comment:        The GeneXpert MRSA Assay (FDA approved for NASAL specimens only), is one component of a comprehensive MRSA colonization surveillance program. It is not intended to diagnose MRSA infection nor to guide or monitor treatment for MRSA infections.      Studies: Dg Chest 2 View (if Patient Has Fever And/or Copd)  09/24/2014   CLINICAL DATA:  Shortness of breath.  EXAM: CHEST  2 VIEW  COMPARISON:  12/13/2013.  FINDINGS: Mediastinum hilar structures normal. Cardiomegaly pulmonary vascular prominence and interstitial prominence consistent congestive heart failure noted. No pleural effusion or pneumothorax. Degenerative changes thoracic spine .  IMPRESSION: Congestive heart failure pulmonary interstitial edema.   Electronically Signed   By: Maisie Fus  Register   On: 09/24/2014 15:38    Scheduled Meds: . ARIPiprazole  20 mg Oral Daily  . aspirin EC  81 mg Oral Daily  . atenolol  25 mg Oral Daily  . atorvastatin  40 mg Oral Daily  . DULoxetine  60 mg Oral Daily  . [START ON 09/26/2014] enoxaparin (LOVENOX)  injection  70 mg Subcutaneous Q24H  . fluticasone  1 spray Each Nare Daily  . gabapentin  600 mg Oral TID  . insulin aspart  0-15 Units Subcutaneous TID WC  . insulin aspart  0-5 Units Subcutaneous QHS  . ipratropium-albuterol  3 mL Nebulization Q6H  . lisinopril  5 mg Oral Daily  . methylPREDNISolone (SOLU-MEDROL) injection  80 mg Intravenous Q24H  . mometasone-formoterol  2 puff Inhalation BID  . montelukast  10 mg Oral QHS  . sertraline  100 mg Oral Daily   Continuous Infusions:   Active Problems:   Depression   Neuropathy   DM type 2 (diabetes mellitus, type 2)   HTN (hypertension)   Acute asthma exacerbation   Asthma exacerbation   OSA on CPAP   HLD (hyperlipidemia)   Morbid obesity   Essential hypertension   Diabetes type 2, uncontrolled    Time spent: >35 minutes     Esperanza Sheets  [facility] Pager [phone]. If 7PM-7AM, please contact night-coverage at [email or web address], password [facility] 09/25/2014, 11:55 AM  LOS: 1 day

## 2014-09-26 LAB — COMPREHENSIVE METABOLIC PANEL
ALBUMIN: 3.1 g/dL — AB (ref 3.5–5.2)
ALT: 41 U/L — AB (ref 0–35)
AST: 35 U/L (ref 0–37)
Alkaline Phosphatase: 68 U/L (ref 39–117)
Anion gap: 10 (ref 5–15)
BUN: 12 mg/dL (ref 6–23)
CALCIUM: 9.1 mg/dL (ref 8.4–10.5)
CHLORIDE: 101 mmol/L (ref 96–112)
CO2: 25 mmol/L (ref 19–32)
Creatinine, Ser: 0.93 mg/dL (ref 0.50–1.10)
GFR calc Af Amer: 82 mL/min — ABNORMAL LOW (ref >90)
GFR, EST NON AFRICAN AMERICAN: 71 mL/min — AB (ref >90)
GLUCOSE: 229 mg/dL — AB (ref 70–99)
Potassium: 4.1 mmol/L (ref 3.5–5.1)
Sodium: 136 mmol/L (ref 135–145)
TOTAL PROTEIN: 7.8 g/dL (ref 6.0–8.3)
Total Bilirubin: 0.5 mg/dL (ref 0.3–1.2)

## 2014-09-26 LAB — CBC WITH DIFFERENTIAL/PLATELET
Basophils Absolute: 0 10*3/uL (ref 0.0–0.1)
Basophils Relative: 0 % (ref 0–1)
EOS PCT: 0 % (ref 0–5)
Eosinophils Absolute: 0 10*3/uL (ref 0.0–0.7)
HCT: 37.9 % (ref 36.0–46.0)
HEMOGLOBIN: 12.1 g/dL (ref 12.0–15.0)
LYMPHS ABS: 2.3 10*3/uL (ref 0.7–4.0)
Lymphocytes Relative: 12 % (ref 12–46)
MCH: 28.1 pg (ref 26.0–34.0)
MCHC: 31.9 g/dL (ref 30.0–36.0)
MCV: 88.1 fL (ref 78.0–100.0)
Monocytes Absolute: 1.2 10*3/uL — ABNORMAL HIGH (ref 0.1–1.0)
Monocytes Relative: 6 % (ref 3–12)
NEUTROS ABS: 15.3 10*3/uL — AB (ref 1.7–7.7)
Neutrophils Relative %: 82 % — ABNORMAL HIGH (ref 43–77)
PLATELETS: 350 10*3/uL (ref 150–400)
RBC: 4.3 MIL/uL (ref 3.87–5.11)
RDW: 14.7 % (ref 11.5–15.5)
WBC: 18.8 10*3/uL — ABNORMAL HIGH (ref 4.0–10.5)

## 2014-09-26 LAB — GLUCOSE, CAPILLARY
GLUCOSE-CAPILLARY: 151 mg/dL — AB (ref 70–99)
Glucose-Capillary: 210 mg/dL — ABNORMAL HIGH (ref 70–99)
Glucose-Capillary: 204 mg/dL — ABNORMAL HIGH (ref 70–99)
Glucose-Capillary: 206 mg/dL — ABNORMAL HIGH (ref 70–99)

## 2014-09-26 LAB — MAGNESIUM: MAGNESIUM: 2.1 mg/dL (ref 1.5–2.5)

## 2014-09-26 MED ORDER — CALCIUM CARBONATE ANTACID 500 MG PO CHEW
1.0000 | CHEWABLE_TABLET | Freq: Three times a day (TID) | ORAL | Status: DC | PRN
  Administered 2014-09-26 – 2014-09-27 (×2): 200 mg via ORAL
  Filled 2014-09-26 (×3): qty 1

## 2014-09-26 NOTE — Progress Notes (Signed)
Patient refuses to wear CPAP at this time. RT informed patient to have RN contact RT if she changes her mind.

## 2014-09-26 NOTE — Progress Notes (Signed)
[facility] PROGRESS NOTE  Beth [name] OZH:[id number] DOB: 01/18/1966 DOA: 09/24/2014 PCP: Jeanann Lewandowsky, MD  Assessment/Plan: 49 y/o with PMH of depression, anxiety, diabetes type 2 uncontrolled, HTN, HLD, chronic asthmatic bronchitis, OSA on CPAP, chronic keloid scars presented with difficulty breathing and a cough which is nonproductive, wheezing -admitted with asthma exacerbation  1.  Asthma exacerbation; CXR: no infiltrates, but some edema; exam still mild wheezing; Patient with poorly controlled asthma at baseline,  clinically improving 24 hrs; cont IV Solu-Medrol/taper, cont bronchodilators, oxygen; Respiratory virus panel pending; Influenza: neg;   2. OSA on CPAP; CPAP to cont  3. HTN; BP is soft; will hold Atenolol 25 mg daily; Lisinopril 5 mg; 4. Diabetes type 2 uncontrolled; cont Moderate SSI; Hemoglobin A1c pending 5. Depression; cont  Zoloft 100 mg, cymbalta 60 mg; Abilify 20 mg daily 6. LVH on echo; will obtain cardiac MRI; chronic keloid skin scarring; ? autoimmune process; pend scleroderma antb     Code Status: full Family Communication: d/w patient (indicate person spoken with, relationship, and if by phone, the number) Disposition Plan: home pend clinical improvement    Consultants:  none  Procedures:  Echo : Study Conclusions  - Left ventricle: The cavity size was normal. Wall thickness was increased in a pattern of moderate LVH. There was severe focal basal hypertrophy of the septum (1.7 cm). This is possibly consistent with hypertrophic cardiomyopathy - no LVOT gradient was noted. Systolic function was normal. The estimated ejection fraction was in the range of 60% to 65%. Doppler parameters are consistent with abnormal left ventricular relaxation (grade 1 diastolic dysfunction). The E/e&' ratio is between 8-15, suggesting indeterminate LV fililng pressure. - Left atrium: Severely dilated at 52 ml/m2.  Impressions:  -  Compared to a prior echo in 2015, there is now at least moderate LV wall thickening and severe left atrial enlargment. This may represent hypertrophic cardiomyopathy - no outflow tract gradient was noted. Consider cardiac MRI to further evaluate.  Antibiotics:  none (indicate start date, and stop date if known)  HPI/Subjective: alert  Objective: Filed Vitals:   09/26/14 0619  BP: 122/65  Pulse: 99  Temp: 97.8 F (36.6 C)  Resp: 22    Intake/Output Summary (Last 24 hours) at 09/26/14 0917 Last data filed at 09/26/14 0616  Gross per 24 hour  Intake    222 ml  Output   1650 ml  Net  -1428 ml   Filed Weights   09/24/14 2000 09/25/14 0536 09/26/14 0616  Weight: 138.801 kg (306 lb) 139.209 kg (306 lb 14.4 oz) 137.349 kg (302 lb 12.8 oz)    Exam:   General:  alert  Cardiovascular: s1,s2 rrr  Respiratory: few wheezing at bases   Abdomen: soft, obese, nt  Musculoskeletal: no pittign edema   Data Reviewed: Basic Metabolic Panel:  Recent Labs Lab 09/24/14 1505 09/25/14 0635  NA 139 136  K 3.6 4.2  CL 104 104  CO2 30 27  GLUCOSE 130* 144*  BUN <5* 8  CREATININE 0.83 0.84  CALCIUM 9.1 8.9  MG  --  1.9   Liver Function Tests:  Recent Labs Lab 09/25/14 0635  AST 67*  ALT 52*  ALKPHOS 70  BILITOT 0.6  PROT 7.3  ALBUMIN 3.0*   No results for input(s): LIPASE, AMYLASE in the last 168 hours. No results for input(s): AMMONIA in the last 168 hours. CBC:  Recent Labs Lab 09/24/14 1505 09/25/14 0635  WBC 10.0 11.2*  NEUTROABS  --  8.8*  HGB 12.5 12.0  HCT 38.8 37.4  MCV 87.2 87.2  PLT 310 305   Cardiac Enzymes:  Recent Labs Lab 09/24/14 1627  TROPONINI <0.03   BNP (last 3 results)  Recent Labs  09/24/14 1627  BNP 16.2    ProBNP (last 3 results)  Recent Labs  12/13/13 2309  PROBNP 18.3    CBG:  Recent Labs Lab 09/25/14 0816 09/25/14 1202 09/25/14 1715 09/25/14 2129 09/26/14 0801  GLUCAP 123* 146* 211* 177* 210*     Recent Results (from the past 240 hour(s))  MRSA PCR Screening     Status: None   Collection Time: 09/24/14  9:41 PM  Result Value Ref Range Status   MRSA by PCR NEGATIVE NEGATIVE Final    Comment:        The GeneXpert MRSA Assay (FDA approved for NASAL specimens only), is one component of a comprehensive MRSA colonization surveillance program. It is not intended to diagnose MRSA infection nor to guide or monitor treatment for MRSA infections.      Studies: Dg Chest 2 View (if Patient Has Fever And/or Copd)  09/24/2014   CLINICAL DATA:  Shortness of breath.  EXAM: CHEST  2 VIEW  COMPARISON:  12/13/2013.  FINDINGS: Mediastinum hilar structures normal. Cardiomegaly pulmonary vascular prominence and interstitial prominence consistent congestive heart failure noted. No pleural effusion or pneumothorax. Degenerative changes thoracic spine .  IMPRESSION: Congestive heart failure pulmonary interstitial edema.   Electronically Signed   By: Maisie Fushomas  Register   On: 09/24/2014 15:38    Scheduled Meds: . ARIPiprazole  20 mg Oral Daily  . aspirin EC  81 mg Oral Daily  . atorvastatin  40 mg Oral Daily  . DULoxetine  60 mg Oral Daily  . enoxaparin (LOVENOX) injection  70 mg Subcutaneous Q24H  . fluticasone  1 spray Each Nare Daily  . gabapentin  600 mg Oral TID  . insulin aspart  0-15 Units Subcutaneous TID WC  . insulin aspart  0-5 Units Subcutaneous QHS  . ipratropium-albuterol  3 mL Nebulization Q6H  . methylPREDNISolone (SOLU-MEDROL) injection  60 mg Intravenous Q12H  . mometasone-formoterol  2 puff Inhalation BID  . montelukast  10 mg Oral QHS  . sertraline  100 mg Oral Daily   Continuous Infusions:   Active Problems:   Depression   Neuropathy   DM type 2 (diabetes mellitus, type 2)   HTN (hypertension)   Acute asthma exacerbation   Asthma exacerbation   OSA on CPAP   HLD (hyperlipidemia)   Morbid obesity   Essential hypertension   Diabetes type 2,  uncontrolled    Time spent: >35 minutes     Esperanza SheetsBURIEV, Roselina Burgueno N  [facility] Pager [phone]. If 7PM-7AM, please contact night-coverage at [email or web address], password [email or web address] 09/26/2014, 9:17 AM  LOS: 2 days

## 2014-09-27 ENCOUNTER — Inpatient Hospital Stay (HOSPITAL_COMMUNITY): Payer: Medicaid Other

## 2014-09-27 DIAGNOSIS — J4531 Mild persistent asthma with (acute) exacerbation: Secondary | ICD-10-CM

## 2014-09-27 LAB — HEMOGLOBIN A1C
Hgb A1c MFr Bld: 6.4 % — ABNORMAL HIGH (ref 4.8–5.6)
MEAN PLASMA GLUCOSE: 137 mg/dL

## 2014-09-27 LAB — CBC WITH DIFFERENTIAL/PLATELET
BASOS ABS: 0 10*3/uL (ref 0.0–0.1)
BASOS PCT: 0 % (ref 0–1)
EOS ABS: 0 10*3/uL (ref 0.0–0.7)
Eosinophils Relative: 0 % (ref 0–5)
HEMATOCRIT: 38.9 % (ref 36.0–46.0)
Hemoglobin: 12.4 g/dL (ref 12.0–15.0)
Lymphocytes Relative: 13 % (ref 12–46)
Lymphs Abs: 2.4 10*3/uL (ref 0.7–4.0)
MCH: 28.2 pg (ref 26.0–34.0)
MCHC: 31.9 g/dL (ref 30.0–36.0)
MCV: 88.6 fL (ref 78.0–100.0)
MONO ABS: 0.6 10*3/uL (ref 0.1–1.0)
Monocytes Relative: 3 % (ref 3–12)
Neutro Abs: 14.9 10*3/uL — ABNORMAL HIGH (ref 1.7–7.7)
Neutrophils Relative %: 84 % — ABNORMAL HIGH (ref 43–77)
PLATELETS: 369 10*3/uL (ref 150–400)
RBC: 4.39 MIL/uL (ref 3.87–5.11)
RDW: 14.5 % (ref 11.5–15.5)
WBC: 17.8 10*3/uL — ABNORMAL HIGH (ref 4.0–10.5)

## 2014-09-27 LAB — COMPREHENSIVE METABOLIC PANEL
ALT: 35 U/L (ref 0–35)
AST: 30 U/L (ref 0–37)
Albumin: 3.1 g/dL — ABNORMAL LOW (ref 3.5–5.2)
Alkaline Phosphatase: 67 U/L (ref 39–117)
Anion gap: 9 (ref 5–15)
BILIRUBIN TOTAL: 0.5 mg/dL (ref 0.3–1.2)
BUN: 17 mg/dL (ref 6–23)
CALCIUM: 9.1 mg/dL (ref 8.4–10.5)
CO2: 26 mmol/L (ref 19–32)
Chloride: 104 mmol/L (ref 96–112)
Creatinine, Ser: 0.82 mg/dL (ref 0.50–1.10)
GFR calc Af Amer: >90 mL/min (ref >90)
GFR calc non Af Amer: 83 mL/min — ABNORMAL LOW (ref >90)
GLUCOSE: 168 mg/dL — AB (ref 70–99)
POTASSIUM: 4.3 mmol/L (ref 3.5–5.1)
Sodium: 139 mmol/L (ref 135–145)
Total Protein: 7.6 g/dL (ref 6.0–8.3)

## 2014-09-27 LAB — GLUCOSE, CAPILLARY
Glucose-Capillary: 117 mg/dL — ABNORMAL HIGH (ref 70–99)
Glucose-Capillary: 136 mg/dL — ABNORMAL HIGH (ref 70–99)
Glucose-Capillary: 164 mg/dL — ABNORMAL HIGH (ref 70–99)
Glucose-Capillary: 119 mg/dL — ABNORMAL HIGH (ref 70–99)

## 2014-09-27 LAB — ANTI-SCLERODERMA ANTIBODY: Scleroderma (Scl-70) (ENA) Antibody, IgG: <1

## 2014-09-27 LAB — MAGNESIUM: MAGNESIUM: 2.2 mg/dL (ref 1.5–2.5)

## 2014-09-27 MED ORDER — DOCUSATE SODIUM 100 MG PO CAPS
100.0000 mg | ORAL_CAPSULE | Freq: Two times a day (BID) | ORAL | Status: DC
  Administered 2014-09-27 – 2014-09-28 (×3): 100 mg via ORAL
  Filled 2014-09-27 (×4): qty 1

## 2014-09-27 MED ORDER — METHYLPREDNISOLONE SODIUM SUCC 40 MG IJ SOLR
40.0000 mg | Freq: Two times a day (BID) | INTRAMUSCULAR | Status: DC
  Administered 2014-09-27 – 2014-09-28 (×2): 40 mg via INTRAVENOUS
  Filled 2014-09-27 (×3): qty 1

## 2014-09-27 MED ORDER — ALBUTEROL SULFATE (2.5 MG/3ML) 0.083% IN NEBU: 2.5000 mg | INHALATION_SOLUTION | RESPIRATORY_TRACT | Status: DC | PRN

## 2014-09-27 MED ORDER — IPRATROPIUM-ALBUTEROL 0.5-2.5 (3) MG/3ML IN SOLN
3.0000 mL | Freq: Three times a day (TID) | RESPIRATORY_TRACT | Status: DC
  Administered 2014-09-27 – 2014-09-28 (×2): 3 mL via RESPIRATORY_TRACT
  Filled 2014-09-27 (×2): qty 3

## 2014-09-27 MED ORDER — IPRATROPIUM-ALBUTEROL 0.5-2.5 (3) MG/3ML IN SOLN: 3.0000 mL | Freq: Two times a day (BID) | RESPIRATORY_TRACT | Status: DC

## 2014-09-27 MED ORDER — METOPROLOL TARTRATE 12.5 MG HALF TABLET
12.5000 mg | ORAL_TABLET | Freq: Two times a day (BID) | ORAL | Status: DC
  Administered 2014-09-27 – 2014-09-28 (×2): 12.5 mg via ORAL
  Filled 2014-09-27 (×3): qty 1

## 2014-09-27 NOTE — Progress Notes (Signed)
Utilization review completed. Luismiguel Lamere, RN, BSN. 

## 2014-09-27 NOTE — Progress Notes (Signed)
PATIENT DETAILS Name: Beth [name] Age: 49 y.o. Sex: female Date of Birth: 1965-10-03 Admit Date: 09/24/2014 Admitting Physician Drema Dallas, MD WUJ:[id number], Keane Scrape, MD  Subjective: Breathing Better, but not yet back to baseline.  Assessment/Plan: Active Problems:   Asthma exacerbation: Improving with IV steroids, nebulized bronchodilators. Decrease IV Solu-Medrol, follow clinical course. Suspect should be able to discharge on 4/5.     Hypertrophic cardiomyopathy: Seen on echocardiogram with severe septal hypertrophy and left atrial enlargement. No outflow tract gradient noted. Cardiac MRI attempted, but unable to complete because of weight. This M.D., spoke with cardiology-Dr Clifton James on 4/4, who reviewed the chart and suggested to continue with beta blockers. He suggested outpatient cardiology follow-up.     Obstructive sleep apnea: Continue CPAP     Hypertension: Will start metoprolol, BP now stable.     Type 2 diabetes : CBGs with moderate control-patient on steroids. A1c at 6.4. Anticipate that CBGs will improve when steroids tapered off. Continue with SSI for now.     Depression: Continue Zoloft, Cymbalta and Abilify     History of neuropathy: Continue with Neurontin     Obesity: Counseled regarding importance of weight loss  Disposition: Remain inpatient  Antibiotics:  None   Anti-infectives    None      DVT Prophylaxis: Prophylactic Lovenox  Code Status: Full code o  Family Communication None at bedside  Procedures:  None  CONSULTS:  None  Time spent 40 minutes-which includes 50% of the time with face-to-face with patient/ family and coordinating care related to the above assessment and plan.  MEDICATIONS: Scheduled Meds: . ARIPiprazole  20 mg Oral Daily  . aspirin EC  81 mg Oral Daily  . atorvastatin  40 mg Oral Daily  . docusate sodium  100 mg Oral BID  . DULoxetine  60 mg Oral Daily  . enoxaparin (LOVENOX) injection  70 mg  Subcutaneous Q24H  . fluticasone  1 spray Each Nare Daily  . gabapentin  600 mg Oral TID  . insulin aspart  0-15 Units Subcutaneous TID WC  . insulin aspart  0-5 Units Subcutaneous QHS  . [START ON 09/28/2014] ipratropium-albuterol  3 mL Nebulization BID  . methylPREDNISolone (SOLU-MEDROL) injection  60 mg Intravenous Q12H  . mometasone-formoterol  2 puff Inhalation BID  . montelukast  10 mg Oral QHS  . sertraline  100 mg Oral Daily   Continuous Infusions:  PRN Meds:.albuterol, calcium carbonate, HYDROcodone-acetaminophen, meloxicam, promethazine, traMADol    PHYSICAL EXAM: Vital signs in last 24 hours: Filed Vitals:   09/26/14 2153 09/26/14 2326 09/27/14 0533 09/27/14 0802  BP: 131/75  149/95   Pulse:   90   Temp: 98.2 F (36.8 C)  97.6 F (36.4 C)   TempSrc: Oral  Oral   Resp: 23  24   Height:      Weight:   139.027 kg (306 lb 8 oz)   SpO2: 96% 95% 91% 96%    Weight change: 1.678 kg (3 lb 11.2 oz) Filed Weights   09/25/14 0536 09/26/14 0616 09/27/14 0533  Weight: 139.209 kg (306 lb 14.4 oz) 137.349 kg (302 lb 12.8 oz) 139.027 kg (306 lb 8 oz)   Body mass index is 46.61 kg/(m^2).   Gen Exam: Awake and alert with clear speech.   Neck: Supple, No JVD.   Chest: Good air entry bilaterally-scattered rhonchi bilaterally CVS: S1 S2 Regular, no murmurs.  Abdomen: soft, BS +, non tender, non distended.  Extremities: no edema, lower extremities warm to touch. Neurologic: Non Focal.   Skin: No Rash.   Wounds: N/A.   Intake/Output from previous day:  Intake/Output Summary (Last 24 hours) at 09/27/14 1546 Last data filed at 09/27/14 0825  Gross per 24 hour  Intake    240 ml  Output   2950 ml  Net  -2710 ml     LAB RESULTS: CBC  Recent Labs Lab 09/24/14 1505 09/25/14 0635 09/26/14 0950 09/27/14 0559  WBC 10.0 11.2* 18.8* 17.8*  HGB 12.5 12.0 12.1 12.4  HCT 38.8 37.4 37.9 38.9  PLT 310 305 350 369  MCV 87.2 87.2 88.1 88.6  MCH 28.1 28.0 28.1 28.2  MCHC 32.2  32.1 31.9 31.9  RDW 14.4 14.5 14.7 14.5  LYMPHSABS  --  1.8 2.3 2.4  MONOABS  --  0.5 1.2* 0.6  EOSABS  --  0.0 0.0 0.0  BASOSABS  --  0.0 0.0 0.0    Chemistries   Recent Labs Lab 09/24/14 1505 09/25/14 0635 09/26/14 0950 09/27/14 0559  NA 139 136 136 139  K 3.6 4.2 4.1 4.3  CL 104 104 101 104  CO2 GLUCOSE 130* 144* 229* 168*  BUN <5* CREATININE 0.83 0.84 0.93 0.82  CALCIUM 9.1 8.9 9.1 9.1  MG  --  1.9 2.1 2.2    CBG:  Recent Labs Lab 09/26/14 1141 09/26/14 1716 09/26/14 2151 09/27/14 0803 09/27/14 1203  GLUCAP 204* 206* 151* 117* 136*    GFR Estimated Creatinine Clearance: 123 mL/min (by C-G formula based on Cr of 0.82).  Coagulation profile No results for input(s): INR, PROTIME in the last 168 hours.  Cardiac Enzymes  Recent Labs Lab 09/24/14 1627  TROPONINI <0.03    Invalid input(s): POCBNP No results for input(s): DDIMER in the last 72 hours.  Recent Labs  09/24/14 2257  HGBA1C 6.4*    Recent Labs  09/24/14 2257  CHOL 253*  HDL 28*  LDLCALC 191*  TRIG 170*  CHOLHDL 9.0    Recent Labs  09/24/14 2257  TSH 1.282   No results for input(s): VITAMINB12, FOLATE, FERRITIN, TIBC, IRON, RETICCTPCT in the last 72 hours. No results for input(s): LIPASE, AMYLASE in the last 72 hours.  Urine Studies No results for input(s): UHGB, CRYS in the last 72 hours.  Invalid input(s): UACOL, UAPR, USPG, UPH, UTP, UGL, UKET, UBIL, UNIT, UROB, ULEU, UEPI, UWBC, URBC, UBAC, CAST, UCOM, BILUA  MICROBIOLOGY: Recent Results (from the past 240 hour(s))  MRSA PCR Screening     Status: None   Collection Time: 09/24/14  9:41 PM  Result Value Ref Range Status   MRSA by PCR NEGATIVE NEGATIVE Final    Comment:        The GeneXpert MRSA Assay (FDA approved for NASAL specimens only), is one component of a comprehensive MRSA colonization surveillance program. It is not intended to diagnose MRSA infection nor to guide or monitor  treatment for MRSA infections.     RADIOLOGY STUDIES/RESULTS: Dg Chest 2 View (if Patient Has Fever And/or Copd)  09/24/2014   CLINICAL DATA:  Shortness of breath.  EXAM: CHEST  2 VIEW  COMPARISON:  12/13/2013.  FINDINGS: Mediastinum hilar structures normal. Cardiomegaly pulmonary vascular prominence and interstitial prominence consistent congestive heart failure noted. No pleural effusion or pneumothorax. Degenerative changes thoracic spine .  IMPRESSION: Congestive heart failure pulmonary interstitial edema.   Electronically Signed   By: Maisie Fus  Register  On: 09/24/2014 15:38    Jeoffrey MassedGHIMIRE,Blair Lundeen, MD  [facility] Pager:[phone]  If 7PM-7AM, please contact night-coverage [email or web address] Password [email or web address] 09/27/2014, 3:46 PM   LOS: 3 days

## 2014-09-28 LAB — COMPREHENSIVE METABOLIC PANEL
ALBUMIN: 3.2 g/dL — AB (ref 3.5–5.2)
ALT: 33 U/L (ref 0–35)
AST: 31 U/L (ref 0–37)
Alkaline Phosphatase: 61 U/L (ref 39–117)
Anion gap: 5 (ref 5–15)
BILIRUBIN TOTAL: 0.6 mg/dL (ref 0.3–1.2)
BUN: 19 mg/dL (ref 6–23)
CHLORIDE: 102 mmol/L (ref 96–112)
CO2: 31 mmol/L (ref 19–32)
CREATININE: 0.84 mg/dL (ref 0.50–1.10)
Calcium: 8.5 mg/dL (ref 8.4–10.5)
GFR calc Af Amer: >90 mL/min (ref >90)
GFR, EST NON AFRICAN AMERICAN: 80 mL/min — AB (ref >90)
Glucose, Bld: 134 mg/dL — ABNORMAL HIGH (ref 70–99)
Potassium: 3.9 mmol/L (ref 3.5–5.1)
Sodium: 138 mmol/L (ref 135–145)
Total Protein: 7.5 g/dL (ref 6.0–8.3)

## 2014-09-28 LAB — CBC WITH DIFFERENTIAL/PLATELET
BASOS PCT: 0 % (ref 0–1)
Basophils Absolute: 0 10*3/uL (ref 0.0–0.1)
EOS PCT: 0 % (ref 0–5)
Eosinophils Absolute: 0 10*3/uL (ref 0.0–0.7)
HEMATOCRIT: 38.9 % (ref 36.0–46.0)
HEMOGLOBIN: 12.9 g/dL (ref 12.0–15.0)
LYMPHS ABS: 2.9 10*3/uL (ref 0.7–4.0)
LYMPHS PCT: 19 % (ref 12–46)
MCH: 29 pg (ref 26.0–34.0)
MCHC: 33.2 g/dL (ref 30.0–36.0)
MCV: 87.4 fL (ref 78.0–100.0)
Monocytes Absolute: 0.9 10*3/uL (ref 0.1–1.0)
Monocytes Relative: 6 % (ref 3–12)
NEUTROS ABS: 11.4 10*3/uL — AB (ref 1.7–7.7)
Neutrophils Relative %: 75 % (ref 43–77)
Platelets: 358 10*3/uL (ref 150–400)
RBC: 4.45 MIL/uL (ref 3.87–5.11)
RDW: 14.3 % (ref 11.5–15.5)
WBC: 15.2 10*3/uL — ABNORMAL HIGH (ref 4.0–10.5)

## 2014-09-28 LAB — RESPIRATORY VIRUS PANEL
ADENOVIRUS: NEGATIVE
INFLUENZA A: NEGATIVE
INFLUENZA B 1: NEGATIVE
METAPNEUMOVIRUS: NEGATIVE
Parainfluenza 1: NEGATIVE
Parainfluenza 2: NEGATIVE
Parainfluenza 3: NEGATIVE
Respiratory Syncytial Virus A: NEGATIVE
Respiratory Syncytial Virus B: NEGATIVE
Rhinovirus: NEGATIVE

## 2014-09-28 LAB — GLUCOSE, CAPILLARY
Glucose-Capillary: 124 mg/dL — ABNORMAL HIGH (ref 70–99)
Glucose-Capillary: 136 mg/dL — ABNORMAL HIGH (ref 70–99)

## 2014-09-28 LAB — MAGNESIUM: MAGNESIUM: 2.2 mg/dL (ref 1.5–2.5)

## 2014-09-28 MED ORDER — METOPROLOL TARTRATE 25 MG PO TABS: 25.0000 mg | ORAL_TABLET | Freq: Two times a day (BID) | ORAL | Status: DC

## 2014-09-28 MED ORDER — PREDNISONE 10 MG PO TABS: ORAL_TABLET | ORAL | Status: DC

## 2014-09-28 NOTE — Progress Notes (Signed)
NURSING PROGRESS NOTE  Beth [name] [id number] Discharge Data: 09/28/2014 12:08 PM Attending Provider: Maretta Bees, MD [id number], Keane Scrape, MD     Yehuda Budd to be D/C'd Home per MD order.  Discussed with the patient the After Visit Summary and all questions fully answered. All IV's discontinued with no bleeding noted. All belongings returned to patient for patient to take home.   Last Vital Signs:  Blood pressure 128/64, pulse 79, temperature 98.3 F (36.8 C), temperature source Oral, resp. rate 18, height  (1.727 m), weight 137.168 kg (302 lb 6.4 oz), last menstrual period 03/27/2014, SpO2 93 %.  Discharge Medication List   Medication List    STOP taking these medications        atenolol 25 MG tablet  Commonly known as:  TENORMIN     hydrochlorothiazide 12.5 MG tablet  Commonly known as:  HYDRODIURIL     hydrochlorothiazide 25 MG tablet  Commonly known as:  HYDRODIURIL     HYDROcodone-acetaminophen 5-325 MG per tablet  Commonly known as:  NORCO/VICODIN     sulfamethoxazole-trimethoprim 800-160 MG per tablet  Commonly known as:  SEPTRA DS      TAKE these medications        ARIPiprazole 20 MG tablet  Commonly known as:  ABILIFY  Take 1 tablet (20 mg total) by mouth daily.     atorvastatin 20 MG tablet  Commonly known as:  LIPITOR  Take 1 tablet (20 mg total) by mouth daily.     DULoxetine 60 MG capsule  Commonly known as:  CYMBALTA  Take 1 capsule (60 mg total) by mouth daily.     fluticasone 50 MCG/ACT nasal spray  Commonly known as:  FLONASE  Place 1 spray into both nostrils daily.     Fluticasone-Salmeterol 250-50 MCG/DOSE Aepb  Commonly known as:  ADVAIR  Inhale 1 puff into the lungs every 12 (twelve) hours. For shortness of breath     gabapentin 600 MG tablet  Commonly known as:  NEURONTIN  Take 1 tablet (600 mg total) by mouth 3 (three) times daily.     loratadine 10 MG tablet  Commonly known as:  CLARITIN  Take 1 tablet (10 mg  total) by mouth daily.     meloxicam 15 MG tablet  Commonly known as:  MOBIC  Take 1 tablet (15 mg total) by mouth daily as needed for pain. Take with food     metoprolol tartrate 25 MG tablet  Commonly known as:  LOPRESSOR  Take 1 tablet (25 mg total) by mouth 2 (two) times daily.     predniSONE 10 MG tablet  Commonly known as:  DELTASONE  - Take 4 tablets (40 mg) daily for 2 days, then,  - Take 3 tablets (30 mg) daily for 2 days, then,  - Take 2 tablets (20 mg) daily for 2 days, then,  - Take 1 tablets (10 mg) daily for 1 days, then stop     promethazine 25 MG tablet  Commonly known as:  PHENERGAN  Take 1 tablet (25 mg total) by mouth every 6 (six) hours as needed for nausea or vomiting.     sertraline 100 MG tablet  Commonly known as:  ZOLOFT  Take 1 tablet (100 mg total) by mouth daily.     traMADol 50 MG tablet  Commonly known as:  ULTRAM  Take 1 tablet (50 mg total) by mouth every 8 (eight) hours as needed.     VENTOLIN HFA  108 (90 BASE) MCG/ACT inhaler  Generic drug:  albuterol  INHALE 2 PUFFS INTO THE LUNGS EVERY 4 HOURS AS NEEDED FOR WHEEZING OR SHORTNESS OF BREATH. (MAP)         Josede Cicero, RN

## 2014-09-28 NOTE — Progress Notes (Signed)
CARE MANAGEMENT NOTE 09/28/2014  Patient:  Beth [name],Beth [name]   Account Number:  [id number]  Date Initiated:  09/28/2014  Documentation initiated by:  [facility],Sihaam Chrobak  Subjective/Objective Assessment:   DM, HTN, asthma exac     Action/Plan:   Anticipated DC Date:  09/28/2014   Anticipated DC Plan:  HOME/SELF CARE      DC Planning Services  CM consult  Medication Assistance  [facility]      Choice offered to / List presented to:             Status of service:  Completed, signed off Medicare Important Message given?  NO (If response is "NO", the following Medicare IM given date fields will be blank) Date Medicare IM given:   Medicare IM given by:   Date Additional Medicare IM given:   Additional Medicare IM given by:    Discharge Disposition:  HOME/SELF CARE  Per UR Regulation:    If discussed at Long Length of Stay Meetings, dates discussed:    Comments:  09/28/2014 1200 NCM [facility] to pt [facility] for 10/04/2014 at 3 pm. Pt states she will keep that appt. She picks up her meds from the clinic. Isidoro DonningAlesia Marleigh Kaylor RN CCM Case Mgmt phone [phone]

## 2014-09-28 NOTE — Discharge Summary (Signed)
PATIENT DETAILS Name: Beth [name] M Mcwethy Age: 49 y.o. Sex: female Date of Birth: 02/12/1966 MRN: [id number]. Admitting Physician: Drema Dallasurtis J Woods, MD JXB:JYNWGNPCP:JEGEDE, Keane ScrapeLUGBEMIGA, MD  Admit Date: 09/24/2014 Discharge date: 09/28/2014  Recommendations for Outpatient Follow-up:  1. Please check CBC and chemistries at next visit 2. Please refer patient to cardiology for evaluation of hypertrophic cardiomyopathy.  3. Please recheck A1c in the next 3 months, may need initiation of diabetic treatment.  PRIMARY DISCHARGE DIAGNOSIS:  Active Problems:   Depression   Neuropathy   DM type 2 (diabetes mellitus, type 2)   HTN (hypertension)   Acute asthma exacerbation   Asthma exacerbation   OSA on CPAP   HLD (hyperlipidemia)   Morbid obesity   Essential hypertension   Diabetes type 2, uncontrolled      PAST MEDICAL HISTORY: Past Medical History  Diagnosis Date  . Hypertension   . Asthma   . Coronary artery disease   . Hyperlipidemia   . Phlebitis   . Asthmatic bronchitis , chronic   . Depression   . Anxiety   . Type II diabetes mellitus   . History of blood transfusion     "related to nose would not stop bleeding"  . GERD (gastroesophageal reflux disease)   . Arthritis     "knees" (12/14/2013)    DISCHARGE MEDICATIONS: Current Discharge Medication List    START taking these medications   Details  metoprolol tartrate (LOPRESSOR) 25 MG tablet Take 1 tablet (25 mg total) by mouth 2 (two) times daily. Qty: 60 tablet, Refills: 0    predniSONE (DELTASONE) 10 MG tablet Take 4 tablets (40 mg) daily for 2 days, then, Take 3 tablets (30 mg) daily for 2 days, then, Take 2 tablets (20 mg) daily for 2 days, then, Take 1 tablets (10 mg) daily for 1 days, then stop Qty: 19 tablet, Refills: 0      CONTINUE these medications which have NOT CHANGED   Details  ARIPiprazole (ABILIFY) 20 MG tablet Take 1 tablet (20 mg total) by mouth daily. Qty: 90 tablet, Refills: 3    atorvastatin (LIPITOR)  20 MG tablet Take 1 tablet (20 mg total) by mouth daily. Qty: 30 tablet, Refills: 3    DULoxetine (CYMBALTA) 60 MG capsule Take 1 capsule (60 mg total) by mouth daily. Qty: 90 capsule, Refills: 3    fluticasone (FLONASE) 50 MCG/ACT nasal spray Place 1 spray into both nostrils daily. Qty: 16 g, Refills: 6    Fluticasone-Salmeterol (ADVAIR) 250-50 MCG/DOSE AEPB Inhale 1 puff into the lungs every 12 (twelve) hours. For shortness of breath Qty: 3 each, Refills: 3    gabapentin (NEURONTIN) 600 MG tablet Take 1 tablet (600 mg total) by mouth 3 (three) times daily. Qty: 90 tablet, Refills: 5    loratadine (CLARITIN) 10 MG tablet Take 1 tablet (10 mg total) by mouth daily. Qty: 30 tablet, Refills: 5    meloxicam (MOBIC) 15 MG tablet Take 1 tablet (15 mg total) by mouth daily as needed for pain. Take with food Qty: 30 tablet, Refills: 1   Associated Diagnoses: Venous stasis ulcer, left    promethazine (PHENERGAN) 25 MG tablet Take 1 tablet (25 mg total) by mouth every 6 (six) hours as needed for nausea or vomiting. Qty: 15 tablet, Refills: 0    sertraline (ZOLOFT) 100 MG tablet Take 1 tablet (100 mg total) by mouth daily. Qty: 30 tablet, Refills: 5    traMADol (ULTRAM) 50 MG tablet Take 1 tablet (50 mg  total) by mouth every 8 (eight) hours as needed. Qty: 60 tablet, Refills: 0   Associated Diagnoses: Venous stasis ulcer, left    VENTOLIN HFA 108 (90 BASE) MCG/ACT inhaler INHALE 2 PUFFS INTO THE LUNGS EVERY 4 HOURS AS NEEDED FOR WHEEZING OR SHORTNESS OF BREATH. (MAP) Qty: 54 each, Refills: PRN      STOP taking these medications     atenolol (TENORMIN) 25 MG tablet      hydrochlorothiazide (HYDRODIURIL) 25 MG tablet      HYDROcodone-acetaminophen (NORCO/VICODIN) 5-325 MG per tablet      hydrochlorothiazide (HYDRODIURIL) 12.5 MG tablet      sulfamethoxazole-trimethoprim (SEPTRA DS) 800-160 MG per tablet         ALLERGIES:  No Known Allergies  BRIEF HPI:  See H&P, Labs,  Consult and Test reports for all details in brief, patient is a 49 year old female with history of asthma, morbid obesity, obstructive sleep apnea on CPAP presented with 3 days of worsening shortness of breath and cough. She was thought to have acute exacerbation of asthma, and admitted to the hospital for further evaluation and treatment  CONSULTATIONS:   None  PERTINENT RADIOLOGIC STUDIES: Dg Chest 2 View (if Patient Has Fever And/or Copd)  09/24/2014   CLINICAL DATA:  Shortness of breath.  EXAM: CHEST  2 VIEW  COMPARISON:  12/13/2013.  FINDINGS: Mediastinum hilar structures normal. Cardiomegaly pulmonary vascular prominence and interstitial prominence consistent congestive heart failure noted. No pleural effusion or pneumothorax. Degenerative changes thoracic spine .  IMPRESSION: Congestive heart failure pulmonary interstitial edema.   Electronically Signed   By: Maisie Fus  Register   On: 09/24/2014 15:38     PERTINENT LAB RESULTS: CBC:  Recent Labs  09/27/14 0559 09/28/14 0730  WBC 17.8* 15.2*  HGB 12.4 12.9  HCT 38.9 38.9  PLT 369 358   CMET CMP     Component Value Date/Time   NA 138 09/28/2014 0730   K 3.9 09/28/2014 0730   CL 102 09/28/2014 0730   CO2 31 09/28/2014 0730   GLUCOSE 134* 09/28/2014 0730   BUN 19 09/28/2014 0730   CREATININE 0.84 09/28/2014 0730   CREATININE 0.78 12/05/2012 1118   CALCIUM 8.5 09/28/2014 0730   PROT 7.5 09/28/2014 0730   ALBUMIN 3.2* 09/28/2014 0730   AST 31 09/28/2014 0730   ALT 33 09/28/2014 0730   ALKPHOS 61 09/28/2014 0730   BILITOT 0.6 09/28/2014 0730   GFRNONAA 80* 09/28/2014 0730   GFRAA >90 09/28/2014 0730    GFR Estimated Creatinine Clearance: 119.2 mL/min (by C-G formula based on Cr of 0.84). No results for input(s): LIPASE, AMYLASE in the last 72 hours. No results for input(s): CKTOTAL, CKMB, CKMBINDEX, TROPONINI in the last 72 hours. Invalid input(s): POCBNP No results for input(s): DDIMER in the last 72 hours. No results  for input(s): HGBA1C in the last 72 hours. No results for input(s): CHOL, HDL, LDLCALC, TRIG, CHOLHDL, LDLDIRECT in the last 72 hours. No results for input(s): TSH, T4TOTAL, T3FREE, THYROIDAB in the last 72 hours.  Invalid input(s): FREET3 No results for input(s): VITAMINB12, FOLATE, FERRITIN, TIBC, IRON, RETICCTPCT in the last 72 hours. Coags: No results for input(s): INR in the last 72 hours.  Invalid input(s): PT Microbiology: Recent Results (from the past 240 hour(s))  MRSA PCR Screening     Status: None   Collection Time: 09/24/14  9:41 PM  Result Value Ref Range Status   MRSA by PCR NEGATIVE NEGATIVE Final    Comment:  The GeneXpert MRSA Assay (FDA approved for NASAL specimens only), is one component of a comprehensive MRSA colonization surveillance program. It is not intended to diagnose MRSA infection nor to guide or monitor treatment for MRSA infections.      BRIEF HOSPITAL COURSE:    Asthma exacerbation: Admitted and started on IV steroids and  nebulized bronchodilators.  Much improved with these measures, by day of discharge, off oxygen and ambulate in well. Lungs are mostly clear with only a few scattered rhonchi here and there. Suspect stable to be discharged home today with tapering prednisone. She will continue with her usual inhaler regimen. I have asked her to follow up with her PCP in the next 1-2 weeks.   Hypertrophic cardiomyopathy: Seen on echocardiogram with severe septal hypertrophy and left atrial enlargement. No outflow tract gradient noted. Cardiac MRI attempted, but unable to complete because of weight. This M.D., spoke with cardiology-Dr Clifton James on 4/4, who reviewed the chart and suggested to continue with beta blockers. He suggested outpatient cardiology follow-up. I will defer this to her PCP who can make a cardiology referral.   Obstructive sleep apnea: Continue CPAP on discharge    Hypertension:  Continue metoprolol, BP  stable.  further optimization to be done in the outpatient setting.   Type 2 diabetes : CBGs with moderate control-patient on steroids. A1c at 6.4. Anticipate that CBGs will improve when steroids tapered off.  Managed with SSI while inpatient. Suspect may need initiation of diabetic treatment at some point, suggest rechecking A1c in the next 3 months.   Depression: Continue Zoloft, Cymbalta and Abilify   History of neuropathy: Continue with Neurontin   Obesity: Counseled regarding importance of weight loss  TODAY-DAY OF DISCHARGE:  Subjective:   Tekeyah Santiago today has no headache,no chest abdominal pain,no new weakness tingling or numbness, feels much better wants to go home today.   Objective:   Blood pressure 128/64, pulse 79, temperature 98.3 F (36.8 C), temperature source Oral, resp. rate 18, height 5\' 8"  (1.727 m), weight 137.168 kg (302 lb 6.4 oz), last menstrual period 03/27/2014, SpO2 93 %.  Intake/Output Summary (Last 24 hours) at 09/28/14 1116 Last data filed at 09/28/14 0355  Gross per 24 hour  Intake    120 ml  Output   1500 ml  Net  -1380 ml   Filed Weights   09/26/14 0616 09/27/14 0533 09/28/14 0354  Weight: 137.349 kg (302 lb 12.8 oz) 139.027 kg (306 lb 8 oz) 137.168 kg (302 lb 6.4 oz)    Exam Awake Alert, Oriented *3, No new F.N deficits, Normal affect [place].AT,PERRAL Supple Neck,No JVD, No cervical lymphadenopathy appriciated.  Symmetrical Chest wall movement, Good air movement bilaterally, CTAB RRR,No Gallops,Rubs or new Murmurs, No Parasternal Heave +ve B.Sounds, Abd Soft, Non tender, No organomegaly appriciated, No rebound -guarding or rigidity. No Cyanosis, Clubbing or edema, No new Rash or bruise  DISCHARGE CONDITION: Stable  DISPOSITION: Home  DISCHARGE INSTRUCTIONS:    Activity:  As tolerated   Diet recommendation: Diabetic Diet Heart Healthy diet  Discharge Instructions    Call MD for:  difficulty breathing, headache or visual  disturbances    Complete by:  As directed      Diet - low sodium heart healthy    Complete by:  As directed      Increase activity slowly    Complete by:  As directed           Follow-up Information    Follow up with JEGEDE,  OLUGBEMIGA, MD. Schedule an appointment as soon as possible for a visit in 1 week.   Specialty:  Internal Medicine   Contact information:   [address] [place] [address] [phone]       Follow up with Lars Masson, MD. Schedule an appointment as soon as possible for a visit in 3 weeks.   Specialty:  Cardiology   Contact information:   [address] [place] [address] [phone]         Total Time spent on discharge equals 45 minutes.  SignedJeoffrey Massed 09/28/2014 11:16 AM

## 2014-09-28 NOTE — Progress Notes (Signed)
  Pharmacy Discharge Medication Therapy Review   Total Number of meds on admission: 10 (polypharmacy > 10 meds)  Indications for all medications: [x]  Yes       []  No  Adherence Review  []  Excellent (no doses missed/week)     [x]  Good (no more than 1 dose missed/week)     []  Partial (2-3 doses missed/week)     []  Poor (>3 doses missed/week)  Total number of high risk medications: 0 (Anticoagulants, Dual antiplatelets, oral Antihyperglycemic agents,Insulins, Antipsychotics, Anti-Seizure meds, Inhalers, HF/ACS meds, Antibiotics and HIV medications)   Assessment: (Medication related problems)  Intervention  YES NO  Explanation   Indications      Medication without noted indication []  [x]     Indication without noted medication []  [x]     Duplicate therapy []  [x]    Efficacy      Suboptimal drug or dose selection []  [x]    Insufficient dose [x]  []  Based on pt factors and current guidelines, pt should be on high-intensity statin.  Her Atorva was increased from 20mg  to 40mg  during stay, but returned to 20mg  on discharge.  Failure to receive therapy  (Rx not filled) []  [x]     Safety      Adverse drug event []  [x]     Drug interaction []  [x]     Excessive dose/duration []  [x]    High-risk medications []  [x]    Compliance     Underuse []  [x]     Overuse []  [x]    Other pertinent pharmacist counseling [x]  []   It appears there was confusion regarding pt's use of HCTZ on admit.  She was taking HCTZ 25mg  daily on admit >> not resumed inpatient.  Her BP has been variable between controlled and elevated.    Total number of new medications upon discharge: 2  Time:  Time spent preparing for discharge counseling: 45 minutes Time spent counseling patient: 5 minutes   PLAN: Consider the following at discharge: - Increase dose of Atorvastatin from 20mg  daily to 40mg  daily - Resume taking HCTZ 25mg  daily if BP continues to remain elevated - Counseling regarding new Rx for Lopressor and Prednisone  taper provided  Waynette Butteryegan K. Levonia Wolfley, PharmD Clinical Pharmacy Resident Pager: [phone] 09/28/2014 11:35 AM

## 2014-10-04 ENCOUNTER — Ambulatory Visit: Payer: Self-pay | Attending: Internal Medicine | Admitting: Family Medicine

## 2014-10-04 ENCOUNTER — Encounter: Payer: Self-pay | Admitting: Family Medicine

## 2014-10-04 VITALS — BP 117/81 | HR 71 | Temp 98.2°F | Resp 18 | Ht 68.0 in | Wt 295.0 lb

## 2014-10-04 DIAGNOSIS — J45901 Unspecified asthma with (acute) exacerbation: Secondary | ICD-10-CM

## 2014-10-04 DIAGNOSIS — I10 Essential (primary) hypertension: Secondary | ICD-10-CM

## 2014-10-04 DIAGNOSIS — F419 Anxiety disorder, unspecified: Secondary | ICD-10-CM | POA: Insufficient documentation

## 2014-10-04 DIAGNOSIS — Z9989 Dependence on other enabling machines and devices: Secondary | ICD-10-CM

## 2014-10-04 DIAGNOSIS — Z87891 Personal history of nicotine dependence: Secondary | ICD-10-CM | POA: Insufficient documentation

## 2014-10-04 DIAGNOSIS — M170 Bilateral primary osteoarthritis of knee: Secondary | ICD-10-CM | POA: Insufficient documentation

## 2014-10-04 DIAGNOSIS — E119 Type 2 diabetes mellitus without complications: Secondary | ICD-10-CM

## 2014-10-04 DIAGNOSIS — Z79899 Other long term (current) drug therapy: Secondary | ICD-10-CM | POA: Insufficient documentation

## 2014-10-04 DIAGNOSIS — E875 Hyperkalemia: Secondary | ICD-10-CM | POA: Insufficient documentation

## 2014-10-04 DIAGNOSIS — I2510 Atherosclerotic heart disease of native coronary artery without angina pectoris: Secondary | ICD-10-CM | POA: Insufficient documentation

## 2014-10-04 DIAGNOSIS — G4733 Obstructive sleep apnea (adult) (pediatric): Secondary | ICD-10-CM

## 2014-10-04 DIAGNOSIS — J4531 Mild persistent asthma with (acute) exacerbation: Secondary | ICD-10-CM

## 2014-10-04 DIAGNOSIS — F329 Major depressive disorder, single episode, unspecified: Secondary | ICD-10-CM | POA: Insufficient documentation

## 2014-10-04 DIAGNOSIS — I422 Other hypertrophic cardiomyopathy: Secondary | ICD-10-CM

## 2014-10-04 LAB — CBC WITH DIFFERENTIAL/PLATELET
BASOS ABS: 0 10*3/uL (ref 0.0–0.1)
BASOS PCT: 0 % (ref 0–1)
EOS PCT: 0 % (ref 0–5)
Eosinophils Absolute: 0 10*3/uL (ref 0.0–0.7)
HCT: 40.7 % (ref 36.0–46.0)
HEMOGLOBIN: 13.5 g/dL (ref 12.0–15.0)
LYMPHS PCT: 15 % (ref 12–46)
Lymphs Abs: 2.7 10*3/uL (ref 0.7–4.0)
MCH: 28.4 pg (ref 26.0–34.0)
MCHC: 33.2 g/dL (ref 30.0–36.0)
MCV: 85.7 fL (ref 78.0–100.0)
MPV: 9.9 fL (ref 8.6–12.4)
Monocytes Absolute: 0.7 10*3/uL (ref 0.1–1.0)
Monocytes Relative: 4 % (ref 3–12)
Neutro Abs: 14.7 10*3/uL — ABNORMAL HIGH (ref 1.7–7.7)
Neutrophils Relative %: 81 % — ABNORMAL HIGH (ref 43–77)
PLATELETS: 416 10*3/uL — AB (ref 150–400)
RBC: 4.75 MIL/uL (ref 3.87–5.11)
RDW: 14.6 % (ref 11.5–15.5)
WBC: 18.2 10*3/uL — ABNORMAL HIGH (ref 4.0–10.5)

## 2014-10-04 LAB — GLUCOSE, POCT (MANUAL RESULT ENTRY): POC GLUCOSE: 129 mg/dL — AB (ref 70–99)

## 2014-10-04 MED ORDER — SERTRALINE HCL 100 MG PO TABS: 100.0000 mg | ORAL_TABLET | Freq: Every day | ORAL | Status: AC

## 2014-10-04 NOTE — Progress Notes (Signed)
Subjective:    Patient ID: Beth [name], female    DOB: 08/07/1965, 49 y.o.   MRN: [id number]  HPI  Marishka Rentfrow had presented to [facility] ED with cough and shortness of breath and was admitted between 09/24/14 and 09/28/14 and was found to be hypoxic with an oxygen saturation of 93% at presentation. She was admitted for asthma exacerbation and placed on IV Medrol, DuoNeb and oxygen.  She tested negative for influenza and H1N1. Her chest x-ray revealed heart congestive heart failure with pulmonary interstitial edema; she had a 2-D echo which revealed an EF of 60-65% and basal hypertrophy of the left ventricular septum consistent with hypertrophic cardiomyopathy. Condition improved and she was able to ambulate properly off  oxygen and was discharged subsequently.  Interval history: She reports doing well symptom-wise and is not short of hasn't had any chest pains. She has been compliant with her medications and does have an upcoming appointment with the cardiologist on 10/12/14.  Past Medical History  Diagnosis Date  . Hypertension   . Asthma   . Coronary artery disease   . Hyperlipidemia   . Phlebitis   . Asthmatic bronchitis , chronic   . Depression   . Anxiety   . Type II diabetes mellitus   . History of blood transfusion     "related to nose would not stop bleeding"  . GERD (gastroesophageal reflux disease)   . Arthritis     "knees" (12/14/2013)    Past Surgical History  Procedure Laterality Date  . Hernia repair    . Umbilical hernia repair  05/2004  . Cholecystectomy    . Dilation and curettage of uterus    . Hysteroscopy w/d&c N/A 08/04/2013    Procedure: DILATATION AND CURETTAGE /HYSTEROSCOPY;  Surgeon: Adam Phenix, MD;  Location: [facility];  Service: Gynecology;  Laterality: N/A;    History   Social History  . Marital Status: Divorced    Spouse Name: N/A  . Number of Children: N/A  . Years of Education: N/A   Occupational History  . Not on file.   Social  History Main Topics  . Smoking status: Former Smoker -- 0.25 packs/day for 19 years    Types: Cigarettes  . Smokeless tobacco: Never Used  . Alcohol Use: Yes     Comment: "quit alcohol ~ 2005; only then drank occasionl wine cooler"  . Drug Use: Yes    Special: Cocaine, Marijuana, "Crack" cocaine     Comment: "LAST DRUG USE WAS IN 2005"  . Sexual Activity: Not Currently     Comment: "quit smoking cigarettes ~2005"   Other Topics Concern  . Not on file   Social History Narrative    No Known Allergies  Current Outpatient Prescriptions on File Prior to Visit  Medication Sig Dispense Refill  . ARIPiprazole (ABILIFY) 20 MG tablet Take 1 tablet (20 mg total) by mouth daily. 90 tablet 3  . atorvastatin (LIPITOR) 20 MG tablet Take 1 tablet (20 mg total) by mouth daily. 30 tablet 3  . DULoxetine (CYMBALTA) 60 MG capsule Take 1 capsule (60 mg total) by mouth daily. 90 capsule 3  . fluticasone (FLONASE) 50 MCG/ACT nasal spray Place 1 spray into both nostrils daily. 16 g 6  . Fluticasone-Salmeterol (ADVAIR) 250-50 MCG/DOSE AEPB Inhale 1 puff into the lungs every 12 (twelve) hours. For shortness of breath 3 each 3  . gabapentin (NEURONTIN) 600 MG tablet Take 1 tablet (600 mg total) by mouth 3 (three)  times daily. 90 tablet 5  . loratadine (CLARITIN) 10 MG tablet Take 1 tablet (10 mg total) by mouth daily. 30 tablet 5  . meloxicam (MOBIC) 15 MG tablet Take 1 tablet (15 mg total) by mouth daily as needed for pain. Take with food 30 tablet 1  . metoprolol tartrate (LOPRESSOR) 25 MG tablet Take 1 tablet (25 mg total) by mouth 2 (two) times daily. 60 tablet 0  . predniSONE (DELTASONE) 10 MG tablet Take 4 tablets (40 mg) daily for 2 days, then, Take 3 tablets (30 mg) daily for 2 days, then, Take 2 tablets (20 mg) daily for 2 days, then, Take 1 tablets (10 mg) daily for 1 days, then stop 19 tablet 0  . promethazine (PHENERGAN) 25 MG tablet Take 1 tablet (25 mg total) by mouth every 6 (six) hours as  needed for nausea or vomiting. 15 tablet 0  . VENTOLIN HFA 108 (90 BASE) MCG/ACT inhaler INHALE 2 PUFFS INTO THE LUNGS EVERY 4 HOURS AS NEEDED FOR WHEEZING OR SHORTNESS OF BREATH. (MAP) 54 each PRN  . traMADol (ULTRAM) 50 MG tablet Take 1 tablet (50 mg total) by mouth every 8 (eight) hours as needed. (Patient taking differently: Take 50 mg by mouth every 8 (eight) hours as needed for moderate pain. ) 60 tablet 0  . [DISCONTINUED] buPROPion (WELLBUTRIN XL) 150 MG 24 hr tablet Take 150 mg by mouth daily.      . [DISCONTINUED] metoprolol succinate (TOPROL-XL) 100 MG 24 hr tablet Take 1 tablet (100 mg total) by mouth daily. 30 tablet 2  . [DISCONTINUED] pravastatin (PRAVACHOL) 40 MG tablet Take 40 mg by mouth daily.      . [DISCONTINUED] risperiDONE (RISPERDAL) 2 MG tablet Take 2 mg by mouth at bedtime.       No current facility-administered medications on file prior to visit.     Review of Systems  General: negative for fever, weight loss, appetite change Eyes: no visual symptoms. ENT: no ear symptoms, no sinus tenderness, no nasal congestion or sore throat. Neck: no pain  Respiratory: no wheezing, shortness of breath, cough Cardiovascular: no chest pain, no dyspnea on exertion, no pedal edema, no orthopnea. Gastrointestinal: no abdominal pain, no diarrhea, no constipation Genito-Urinary: no urinary frequency, no dysuria, no polyuria. Hematologic: no bruising Endocrine: no cold or heat intolerance Neurological: no headaches, no seizures, no tremors Musculoskeletal: no joint pains, no joint swelling Skin: no pruritus, no rash. Psychological: no depression, no anxiety,       Objective:  Filed Vitals:   10/04/14 1506  BP: 117/81  Pulse: 71  Temp: 98.2 F (36.8 C)  Resp: 18      Physical Exam  Constitutional: normal appearing,  Eyes: PERRLA HENT: Head is atraumatic, normal sinuses, normal oropharynx, normal appearing tonsils and palate Neck: normal range of motion, no  thyromegaly, no JVD cardiovascular: normal rate and rhythm, normal heart sounds, no murmurs, rub or gallop, no pedal edema Respiratory: clear to auscultation bilaterally, no wheezes, no rales, no rhonchi Abdomen: soft, not tender to palpation, normal bowel sounds, no enlarged organs Extremities: Full ROM, no tenderness in joints Skin: warm and dry, no lesions. Neurological: alert, oriented x3, cranial nerves I-XII grossly intact Psychological: normal mood.  . CBC Latest Ref Rng 09/28/2014 09/27/2014 09/26/2014  WBC 4.0 - 10.5 K/uL 15.2(H) 17.8(H) 18.8(H)  Hemoglobin 12.0 - 15.0 g/dL 04.5 40.9 81.1  Hematocrit 36.0 - 46.0 % 38.9 38.9 37.9  Platelets 150 - 400 K/uL 358 369 350  Assessment & Plan:  49 year old female patient recently hospitalized for acute asthma exacerbation currently doing well at this time; also with new finding of hypertrophic cardiomyopathy on echocardiogram.  Asthma exacerbation: Controlled; advised to complete course of prednisone. Using MDI as needed. I am sending out a CBC to evaluate for leukocytosis which she did have with her last set of labs this could also be steroid-induced.  Hypertension: Controlled, continue current management.  Hypertrophic cardiomyopathy: Advised to keep appointment with cardiologist.  Type 2 diabetes mellitus: Controlled with A1c of 6.4. Continue diet control.  Depression: PH Q9 score is 22 and GAD 7 score is 15 disc was elevated likely due to the fact that she has been out of the Zoloft. Time and I am refilling this medication today. She will need to be reassessed for improvement in symptoms at her next visit.  Obstructive sleep apnea: Remains on CPAP.

## 2014-10-04 NOTE — Patient Instructions (Signed)

## 2014-10-04 NOTE — Progress Notes (Signed)
Patient hospitalized for asthma 4/1-4/5. Patient reports feeling better, no difficulty breathing, and no pain. Patient is out of sertraline, needs refill. Last HgbA1C on 09/24/14 6.4.

## 2014-10-05 ENCOUNTER — Telehealth: Payer: Self-pay

## 2014-10-05 ENCOUNTER — Other Ambulatory Visit: Payer: Self-pay | Admitting: Family Medicine

## 2014-10-05 DIAGNOSIS — D72829 Elevated white blood cell count, unspecified: Secondary | ICD-10-CM

## 2014-10-05 NOTE — Telephone Encounter (Signed)
-----   Message from Jaclyn ShaggyEnobong Amao, MD sent at 10/05/2014  8:35 AM EDT ----- Please inform her that WBCs have increased compared to previous and so I'm ordering a repeat CBC in 1 week

## 2014-10-05 NOTE — Telephone Encounter (Signed)
Nurse called patient, patient verified date of birth. Patient aware of WBCs increased compared to previous blood work. Patient voices understanding and agrees to come in for repeat CBC in 1 week. Nurse will make appt now.

## 2014-10-05 NOTE — Progress Notes (Signed)
Leukocytosis trending up and so I'm ordering a follow-up CBC which will be repeated in one week.

## 2014-10-12 ENCOUNTER — Ambulatory Visit: Payer: Self-pay | Attending: Internal Medicine

## 2014-10-12 DIAGNOSIS — D72829 Elevated white blood cell count, unspecified: Secondary | ICD-10-CM

## 2014-10-12 LAB — CBC WITH DIFFERENTIAL/PLATELET
Basophils Absolute: 0 10*3/uL (ref 0.0–0.1)
Basophils Relative: 0 % (ref 0–1)
EOS ABS: 0.2 10*3/uL (ref 0.0–0.7)
Eosinophils Relative: 2 % (ref 0–5)
HCT: 41.7 % (ref 36.0–46.0)
HEMOGLOBIN: 14.1 g/dL (ref 12.0–15.0)
Lymphocytes Relative: 31 % (ref 12–46)
Lymphs Abs: 3.8 10*3/uL (ref 0.7–4.0)
MCH: 28.7 pg (ref 26.0–34.0)
MCHC: 33.8 g/dL (ref 30.0–36.0)
MCV: 84.9 fL (ref 78.0–100.0)
MPV: 9.9 fL (ref 8.6–12.4)
Monocytes Absolute: 0.7 10*3/uL (ref 0.1–1.0)
Monocytes Relative: 6 % (ref 3–12)
NEUTROS ABS: 7.6 10*3/uL (ref 1.7–7.7)
Neutrophils Relative %: 61 % (ref 43–77)
Platelets: 327 10*3/uL (ref 150–400)
RBC: 4.91 MIL/uL (ref 3.87–5.11)
RDW: 14.7 % (ref 11.5–15.5)
WBC: 12.4 10*3/uL — AB (ref 4.0–10.5)

## 2014-10-14 ENCOUNTER — Telehealth: Payer: Self-pay

## 2014-10-14 NOTE — Telephone Encounter (Signed)
Nurse called patient, reached voicemail. Left message for patient to call Analese Sovine at [phone].   

## 2014-10-14 NOTE — Telephone Encounter (Signed)
-----   Message from Jaclyn ShaggyEnobong Amao, MD sent at 10/13/2014 10:57 PM EDT ----- Previously elevated WBC have trended down.

## 2014-10-15 NOTE — Telephone Encounter (Signed)
Nurse called patient, reached voicemail. Left message for patient to call Chancy Claros at [phone].   

## 2014-10-15 NOTE — Telephone Encounter (Signed)
Patient called returning nurse's phone call °

## 2014-10-15 NOTE — Telephone Encounter (Signed)
Patient returned call to nurse. Patient verified date of birth. Patient aware of WBCs came down. Patient voices understanding.

## 2014-10-15 NOTE — Telephone Encounter (Signed)
-----   Message from Enobong Amao, MD sent at 10/13/2014 10:57 PM EDT ----- Previously elevated WBC have trended down. 

## 2014-10-22 ENCOUNTER — Telehealth: Payer: Self-pay | Admitting: *Deleted

## 2014-10-22 NOTE — Telephone Encounter (Signed)
Patient asking PCP to write her a letter saying that she cannot work.  Explained she would have to come to her scheduled appointment on May 11th at 3pm to address with PCP.

## 2014-11-03 ENCOUNTER — Ambulatory Visit: Payer: Self-pay | Attending: Internal Medicine | Admitting: Internal Medicine

## 2014-11-03 ENCOUNTER — Encounter: Payer: Self-pay | Admitting: Internal Medicine

## 2014-11-03 VITALS — BP 130/85 | HR 89 | Temp 98.5°F | Resp 14 | Ht 68.0 in | Wt 296.0 lb

## 2014-11-03 DIAGNOSIS — J45909 Unspecified asthma, uncomplicated: Secondary | ICD-10-CM | POA: Insufficient documentation

## 2014-11-03 DIAGNOSIS — M129 Arthropathy, unspecified: Secondary | ICD-10-CM

## 2014-11-03 DIAGNOSIS — I2510 Atherosclerotic heart disease of native coronary artery without angina pectoris: Secondary | ICD-10-CM | POA: Insufficient documentation

## 2014-11-03 DIAGNOSIS — E785 Hyperlipidemia, unspecified: Secondary | ICD-10-CM | POA: Insufficient documentation

## 2014-11-03 DIAGNOSIS — F419 Anxiety disorder, unspecified: Secondary | ICD-10-CM | POA: Insufficient documentation

## 2014-11-03 DIAGNOSIS — M1990 Unspecified osteoarthritis, unspecified site: Secondary | ICD-10-CM | POA: Insufficient documentation

## 2014-11-03 DIAGNOSIS — M1710 Unilateral primary osteoarthritis, unspecified knee: Secondary | ICD-10-CM

## 2014-11-03 DIAGNOSIS — E119 Type 2 diabetes mellitus without complications: Secondary | ICD-10-CM

## 2014-11-03 DIAGNOSIS — Z029 Encounter for administrative examinations, unspecified: Secondary | ICD-10-CM

## 2014-11-03 DIAGNOSIS — Z87891 Personal history of nicotine dependence: Secondary | ICD-10-CM | POA: Insufficient documentation

## 2014-11-03 DIAGNOSIS — G8929 Other chronic pain: Secondary | ICD-10-CM | POA: Insufficient documentation

## 2014-11-03 DIAGNOSIS — Z79891 Long term (current) use of opiate analgesic: Secondary | ICD-10-CM | POA: Insufficient documentation

## 2014-11-03 DIAGNOSIS — I10 Essential (primary) hypertension: Secondary | ICD-10-CM | POA: Insufficient documentation

## 2014-11-03 DIAGNOSIS — F329 Major depressive disorder, single episode, unspecified: Secondary | ICD-10-CM | POA: Insufficient documentation

## 2014-11-03 DIAGNOSIS — Z791 Long term (current) use of non-steroidal anti-inflammatories (NSAID): Secondary | ICD-10-CM | POA: Insufficient documentation

## 2014-11-03 DIAGNOSIS — Z7951 Long term (current) use of inhaled steroids: Secondary | ICD-10-CM | POA: Insufficient documentation

## 2014-11-03 LAB — GLUCOSE, POCT (MANUAL RESULT ENTRY): POC Glucose: 199 mg/dl — AB (ref 70–99)

## 2014-11-03 MED ORDER — FREESTYLE LANCETS MISC: Status: DC

## 2014-11-03 MED ORDER — GLUCOSE BLOOD VI STRP: ORAL_STRIP | Status: DC

## 2014-11-03 MED ORDER — FREESTYLE SYSTEM KIT: 1.0000 | PACK | Status: DC | PRN

## 2014-11-03 NOTE — Patient Instructions (Signed)

## 2014-11-03 NOTE — Progress Notes (Signed)
Pt is here needing a letter for the doctor to fill out. Pt states that her knees have chronic pain.

## 2014-11-03 NOTE — Progress Notes (Signed)
Patient ID: Beth [name], female   DOB: 1965-12-01, 49 y.o.   MRN: [id number]  CC: pain, paperwork  HPI: Beth [name] is a 49 y.o. female here today for a follow up visit.  Patient has past medical history of HTN, CAD, asthma, and arthritis. Patient presents with chronic pain in her bilateral knees. She has been to orthopedics and was told that she needs bilateral knee replacements. She has been getting steroid injections every six months. She is requesting a handicap sticker and a letter to foodstamp adminstration stating that she cannot work. She is in the process of applying for disability.   Patient has No headache, No chest pain, No abdominal pain - No Nausea, No new weakness tingling or numbness, No Cough - SOB.  No Known Allergies Past Medical History  Diagnosis Date  . Hypertension   . Asthma   . Coronary artery disease   . Hyperlipidemia   . Phlebitis   . Asthmatic bronchitis , chronic   . Depression   . Anxiety   . Type II diabetes mellitus   . History of blood transfusion     "related to nose would not stop bleeding"  . GERD (gastroesophageal reflux disease)   . Arthritis     "knees" (12/14/2013)   Current Outpatient Prescriptions on File Prior to Visit  Medication Sig Dispense Refill  . ARIPiprazole (ABILIFY) 20 MG tablet Take 1 tablet (20 mg total) by mouth daily. 90 tablet 3  . atorvastatin (LIPITOR) 20 MG tablet Take 1 tablet (20 mg total) by mouth daily. 30 tablet 3  . DULoxetine (CYMBALTA) 60 MG capsule Take 1 capsule (60 mg total) by mouth daily. 90 capsule 3  . fluticasone (FLONASE) 50 MCG/ACT nasal spray Place 1 spray into both nostrils daily. 16 g 6  . Fluticasone-Salmeterol (ADVAIR) 250-50 MCG/DOSE AEPB Inhale 1 puff into the lungs every 12 (twelve) hours. For shortness of breath 3 each 3  . gabapentin (NEURONTIN) 600 MG tablet Take 1 tablet (600 mg total) by mouth 3 (three) times daily. 90 tablet 5  . meloxicam (MOBIC) 15 MG tablet Take 1 tablet (15 mg total)  by mouth daily as needed for pain. Take with food 30 tablet 1  . metoprolol tartrate (LOPRESSOR) 25 MG tablet Take 1 tablet (25 mg total) by mouth 2 (two) times daily. 60 tablet 0  . sertraline (ZOLOFT) 100 MG tablet Take 1 tablet (100 mg total) by mouth daily. 30 tablet 2  . traMADol (ULTRAM) 50 MG tablet Take 1 tablet (50 mg total) by mouth every 8 (eight) hours as needed. (Patient taking differently: Take 50 mg by mouth every 8 (eight) hours as needed for moderate pain. ) 60 tablet 0  . VENTOLIN HFA 108 (90 BASE) MCG/ACT inhaler INHALE 2 PUFFS INTO THE LUNGS EVERY 4 HOURS AS NEEDED FOR WHEEZING OR SHORTNESS OF BREATH. (MAP) 54 each PRN  . loratadine (CLARITIN) 10 MG tablet Take 1 tablet (10 mg total) by mouth daily. (Patient not taking: Reported on 11/03/2014) 30 tablet 5  . predniSONE (DELTASONE) 10 MG tablet Take 4 tablets (40 mg) daily for 2 days, then, Take 3 tablets (30 mg) daily for 2 days, then, Take 2 tablets (20 mg) daily for 2 days, then, Take 1 tablets (10 mg) daily for 1 days, then stop (Patient not taking: Reported on 11/03/2014) 19 tablet 0  . promethazine (PHENERGAN) 25 MG tablet Take 1 tablet (25 mg total) by mouth every 6 (six) hours as needed for nausea  or vomiting. (Patient not taking: Reported on 11/03/2014) 15 tablet 0  . [DISCONTINUED] buPROPion (WELLBUTRIN XL) 150 MG 24 hr tablet Take 150 mg by mouth daily.      . [DISCONTINUED] metoprolol succinate (TOPROL-XL) 100 MG 24 hr tablet Take 1 tablet (100 mg total) by mouth daily. 30 tablet 2  . [DISCONTINUED] pravastatin (PRAVACHOL) 40 MG tablet Take 40 mg by mouth daily.      . [DISCONTINUED] risperiDONE (RISPERDAL) 2 MG tablet Take 2 mg by mouth at bedtime.       No current facility-administered medications on file prior to visit.   Family History  Problem Relation Age of Onset  . Depression Mother   . Hypertension Mother   . Diabetes Mother   . Hypertension Father   . Hyperlipidemia Father   . Depression Sister     History   Social History  . Marital Status: Divorced    Spouse Name: N/A  . Number of Children: N/A  . Years of Education: N/A   Occupational History  . Not on file.   Social History Main Topics  . Smoking status: Former Smoker -- 0.25 packs/day for 19 years    Types: Cigarettes  . Smokeless tobacco: Never Used  . Alcohol Use: Yes     Comment: "quit alcohol ~ 2005; only then drank occasionl wine cooler"  . Drug Use: Yes    Special: Cocaine, Marijuana, "Crack" cocaine     Comment: "LAST DRUG USE WAS IN 2005"  . Sexual Activity: Not Currently     Comment: "quit smoking cigarettes ~2005"   Other Topics Concern  . Not on file   Social History Narrative    Review of Systems: See HPI.    Objective:   Filed Vitals:   11/03/14 1458  BP: 130/85  Pulse: 89  Temp: 98.5 F (36.9 C)  Resp: 14    Physical Exam  Constitutional: She is oriented to person, place, and time.  Cardiovascular: Normal rate, regular rhythm and normal heart sounds.   Pulmonary/Chest: Effort normal and breath sounds normal.  Musculoskeletal: She exhibits no edema or tenderness.  Neurological: She is alert and oriented to person, place, and time.  Skin: Skin is warm and dry.     Lab Results  Component Value Date   WBC 12.4* 10/12/2014   HGB 14.1 10/12/2014   HCT 41.7 10/12/2014   MCV 84.9 10/12/2014   PLT 327 10/12/2014   Lab Results  Component Value Date   CREATININE 0.84 09/28/2014   BUN 19 09/28/2014   NA 138 09/28/2014   K 3.9 09/28/2014   CL 102 09/28/2014   CO2 31 09/28/2014    Lab Results  Component Value Date   HGBA1C 6.4* 09/24/2014   Lipid Panel     Component Value Date/Time   CHOL 253* 09/24/2014 2257   TRIG 170* 09/24/2014 2257   HDL 28* 09/24/2014 2257   CHOLHDL 9.0 09/24/2014 2257   VLDL 34 09/24/2014 2257   LDLCALC 191* 09/24/2014 2257       Assessment and plan:   Beth [name] was seen today for follow-up.  Diagnoses and all orders for this  visit:  Encounters for administrative purpose Paperwork given for handicap sticker. I have explained to patient that I am unable to give her a letter stating that she is unable to work. She is able to work sitting down or with minimal standing and walking.   Arthritis of knee Continue injections until she is able to get knee  replacement surgery  Type 2 diabetes mellitus without complication Orders: -     Glucose (CBG) -     glucose monitoring kit (FREESTYLE) monitoring kit; 1 each by Does not apply route as needed. -     glucose blood test strip; Use as instructed -     Lancets (FREESTYLE) lancets; Use as instructed   Return if symptoms worsen or fail to improve.       Chari Manning, NP-C [facility] [phone] 11/03/2014, 3:30 PM

## 2014-11-11 ENCOUNTER — Ambulatory Visit (INDEPENDENT_AMBULATORY_CARE_PROVIDER_SITE_OTHER): Payer: Self-pay | Admitting: Cardiology

## 2014-11-11 ENCOUNTER — Encounter: Payer: Self-pay | Admitting: Cardiology

## 2014-11-11 VITALS — BP 142/100 | HR 104 | Ht 68.0 in | Wt 295.0 lb

## 2014-11-11 DIAGNOSIS — G4730 Sleep apnea, unspecified: Secondary | ICD-10-CM

## 2014-11-11 DIAGNOSIS — I10 Essential (primary) hypertension: Secondary | ICD-10-CM

## 2014-11-11 DIAGNOSIS — I422 Other hypertrophic cardiomyopathy: Secondary | ICD-10-CM

## 2014-11-11 MED ORDER — VERAPAMIL HCL ER 180 MG PO CP24: 180.0000 mg | ORAL_CAPSULE | Freq: Every day | ORAL | Status: DC

## 2014-11-11 NOTE — Patient Instructions (Signed)
Your physician recommends that you schedule a follow-up appointment in: 3 months with a PA or NP  Stop taking your metoprolol  Start taking verelan 180 mg daily  We are setting you up for a sleep study

## 2014-11-11 NOTE — Progress Notes (Signed)
Cardiology Office Note   Date:  11/11/2014   ID:  HATLEY HENEGAR, DOB 05/02/1966, MRN [id number]  PCP:  Angelica Chessman, MD  Cardiologist:   Minus Breeding, MD   Chief Complaint  Patient presents with  . Cardiomyopathy      History of Present Illness: Beth [name] is a 49 y.o. female who presents for evaluation of hypertrophic cardiomyopathy. She was found to have this recently when she was made with a probable exacerbation of asthma. I did review these records. She did have significant septal hypertrophy but with no outflow gradient. MRI was attempted but because her weight she could not have this.  She presents for follow-up of this. She actually says she feels relatively well. She will have this occasional episode of having a weight of get short of breath but it only lasts for a couple of minutes and is not routine. It doesn't sound like classic PND or orthopnea. She does clearly describe snoring and apnea as well as daytime somnolence with occasional headaches as well. She does have any exertional dyspnea but she is limited by severe joint pain and morbid obesity. This has been a chronic problem. She'll get short of breath walking a short distance on level ground.  She does not get chest pressure, neck or arm discomfort. She does not have palpitations, presyncope or syncope.    Past Medical History  Diagnosis Date  . Hypertension   . Asthma   . Hyperlipidemia   . Phlebitis   . Asthmatic bronchitis , chronic   . Depression   . Anxiety   . Type II diabetes mellitus   . History of blood transfusion     "related to nose would not stop bleeding"  . GERD (gastroesophageal reflux disease)   . Arthritis     "knees" (12/14/2013)  . Hypertrophic cardiomyopathy     Past Surgical History  Procedure Laterality Date  . Umbilical hernia repair  05/2004  . Cholecystectomy    . Dilation and curettage of uterus    . Hysteroscopy w/d&c N/A 08/04/2013    Procedure: DILATATION  AND CURETTAGE /HYSTEROSCOPY;  Surgeon: Woodroe Mode, MD;  Location: [facility];  Service: Gynecology;  Laterality: N/A;     Current Outpatient Prescriptions  Medication Sig Dispense Refill  . ARIPiprazole (ABILIFY) 20 MG tablet Take 1 tablet (20 mg total) by mouth daily. 90 tablet 3  . atorvastatin (LIPITOR) 20 MG tablet Take 1 tablet (20 mg total) by mouth daily. 30 tablet 3  . DULoxetine (CYMBALTA) 60 MG capsule Take 1 capsule (60 mg total) by mouth daily. 90 capsule 3  . fluticasone (FLONASE) 50 MCG/ACT nasal spray Place 1 spray into both nostrils daily. 16 g 6  . Fluticasone-Salmeterol (ADVAIR) 250-50 MCG/DOSE AEPB Inhale 1 puff into the lungs every 12 (twelve) hours. For shortness of breath 3 each 3  . gabapentin (NEURONTIN) 600 MG tablet Take 1 tablet (600 mg total) by mouth 3 (three) times daily. 90 tablet 5  . glucose blood test strip Use as instructed 100 each 12  . glucose monitoring kit (FREESTYLE) monitoring kit 1 each by Does not apply route as needed. 1 each 0  . Lancets (FREESTYLE) lancets Use as instructed 100 each 12  . loratadine (CLARITIN) 10 MG tablet Take 1 tablet (10 mg total) by mouth daily. 30 tablet 5  . meloxicam (MOBIC) 15 MG tablet Take 1 tablet (15 mg total) by mouth daily as needed for pain. Take with food  30 tablet 1  . metoprolol tartrate (LOPRESSOR) 25 MG tablet Take 1 tablet (25 mg total) by mouth 2 (two) times daily. 60 tablet 0  . sertraline (ZOLOFT) 100 MG tablet Take 1 tablet (100 mg total) by mouth daily. 30 tablet 2  . traMADol (ULTRAM) 50 MG tablet Take 1 tablet (50 mg total) by mouth every 8 (eight) hours as needed. (Patient taking differently: Take 50 mg by mouth every 8 (eight) hours as needed for moderate pain. ) 60 tablet 0  . VENTOLIN HFA 108 (90 BASE) MCG/ACT inhaler INHALE 2 PUFFS INTO THE LUNGS EVERY 4 HOURS AS NEEDED FOR WHEEZING OR SHORTNESS OF BREATH. (MAP) 54 each PRN  . [DISCONTINUED] buPROPion (WELLBUTRIN XL) 150 MG 24 hr tablet Take 150 mg  by mouth daily.      . [DISCONTINUED] metoprolol succinate (TOPROL-XL) 100 MG 24 hr tablet Take 1 tablet (100 mg total) by mouth daily. 30 tablet 2  . [DISCONTINUED] pravastatin (PRAVACHOL) 40 MG tablet Take 40 mg by mouth daily.      . [DISCONTINUED] risperiDONE (RISPERDAL) 2 MG tablet Take 2 mg by mouth at bedtime.       No current facility-administered medications for this visit.    Allergies:   Review of patient's allergies indicates no known allergies.    Social History:  The patient  reports that she has quit smoking. Her smoking use included Cigarettes. She has a 4.75 pack-year smoking history. She has never used smokeless tobacco. She reports that she drinks alcohol. She reports that she uses illicit drugs (Cocaine, Marijuana, and "Crack" cocaine).   Family History:  The patient's family history includes Depression in her mother and sister; Diabetes in her mother; Hyperlipidemia in her father; Hypertension in her father and mother.    ROS:  Please see the history of present illness.   Otherwise, review of systems are positive for none.   All other systems are reviewed and negative.    PHYSICAL EXAM: VS:  BP 142/100 mmHg  Pulse 104  Ht 5' 8"  (1.727 m)  Wt 295 lb (133.811 kg)  BMI 44.86 kg/m2  LMP 03/27/2014 , BMI Body mass index is 44.86 kg/(m^2). GENERAL:  Well appearing HEENT:  Pupils equal round and reactive, fundi not visualized, oral mucosa unremarkable NECK:  No jugular venous distention, waveform within normal limits, carotid upstroke brisk and symmetric, no bruits, no thyromegaly LYMPHATICS:  No cervical, inguinal adenopathy LUNGS:  Clear to auscultation bilaterally BACK:  No CVA tenderness CHEST:  Unremarkable HEART:  PMI not displaced or sustained,S1 and S2 within normal limits, no S3, no S4, no clicks, no rubs, no murmurs ABD:  Flat, positive bowel sounds normal in frequency in pitch, no bruits, no rebound, no guarding, no midline pulsatile mass, no hepatomegaly,  no splenomegaly EXT:  2 plus pulses throughout, no edema, no cyanosis no clubbing SKIN:  No rashes no nodules NEURO:  Cranial nerves II through XII grossly intact, motor grossly intact throughout PSYCH:  Cognitively intact, oriented to person place and time    EKG:  EKG is not ordered today. The ekg ordered 09/27/14 demonstrates sinus rhythm, rate 90, left atrial enlargement, QTC borderline prolongation, PVC.   Recent Labs: 12/13/2013: Pro B Natriuretic peptide (BNP) 18.3 09/24/2014: B Natriuretic Peptide 16.2; TSH 1.282 09/28/2014: ALT 33; BUN 19; Creatinine 0.84; Magnesium 2.2; Potassium 3.9; Sodium 138 10/12/2014: Hemoglobin 14.1; Platelets 327    Lipid Panel    Component Value Date/Time   CHOL 253* 09/24/2014 2257  TRIG 170* 09/24/2014 2257   HDL 28* 09/24/2014 2257   CHOLHDL 9.0 09/24/2014 2257   VLDL 34 09/24/2014 2257   LDLCALC 191* 09/24/2014 2257   LDLDIRECT 134* 12/05/2012 1118      Wt Readings from Last 3 Encounters:  11/11/14 295 lb (133.811 kg)  11/03/14 296 lb (134.265 kg)  10/04/14 295 lb (133.811 kg)      Other studies Reviewed: Additional studies/ records that were reviewed today include: Hospital records and echo. Review of the above records demonstrates:  Please see elsewhere in the note.     ASSESSMENT AND PLAN:  HCM:   We will likely follow this clinically. I would like to increase her medications but because of her asthma I don't want her on a beta blocker. I will have her on extended release verapamil.  OBESITY: The patient understands the need to lose weight with diet. Because of her knee she would be of exercise.. We have discussed specific strategies for this.  HTN:  This will be treated as above.   SLEEP APNEA:  She almost definitely has this. I will schedule a sleep study.   Current medicines are reviewed at length with the patient today.  The patient does not have concerns regarding medicines.  The following changes have been made:  As  above  Labs/ tests ordered today include:  No orders of the defined types were placed in this encounter.    Disposition:   FU with APP in 3 months.     Signed, Minus Breeding, MD  11/11/2014 2:22 PM    [facility]

## 2014-11-24 IMAGING — CR DG CHEST 2V
2 series · 2 of 2 positions shown · non-contrast
Comparison: 05/23/2011

CLINICAL DATA: Shortness of breath, hypertension

EXAM:
CHEST  2 VIEW

[w chest pa]
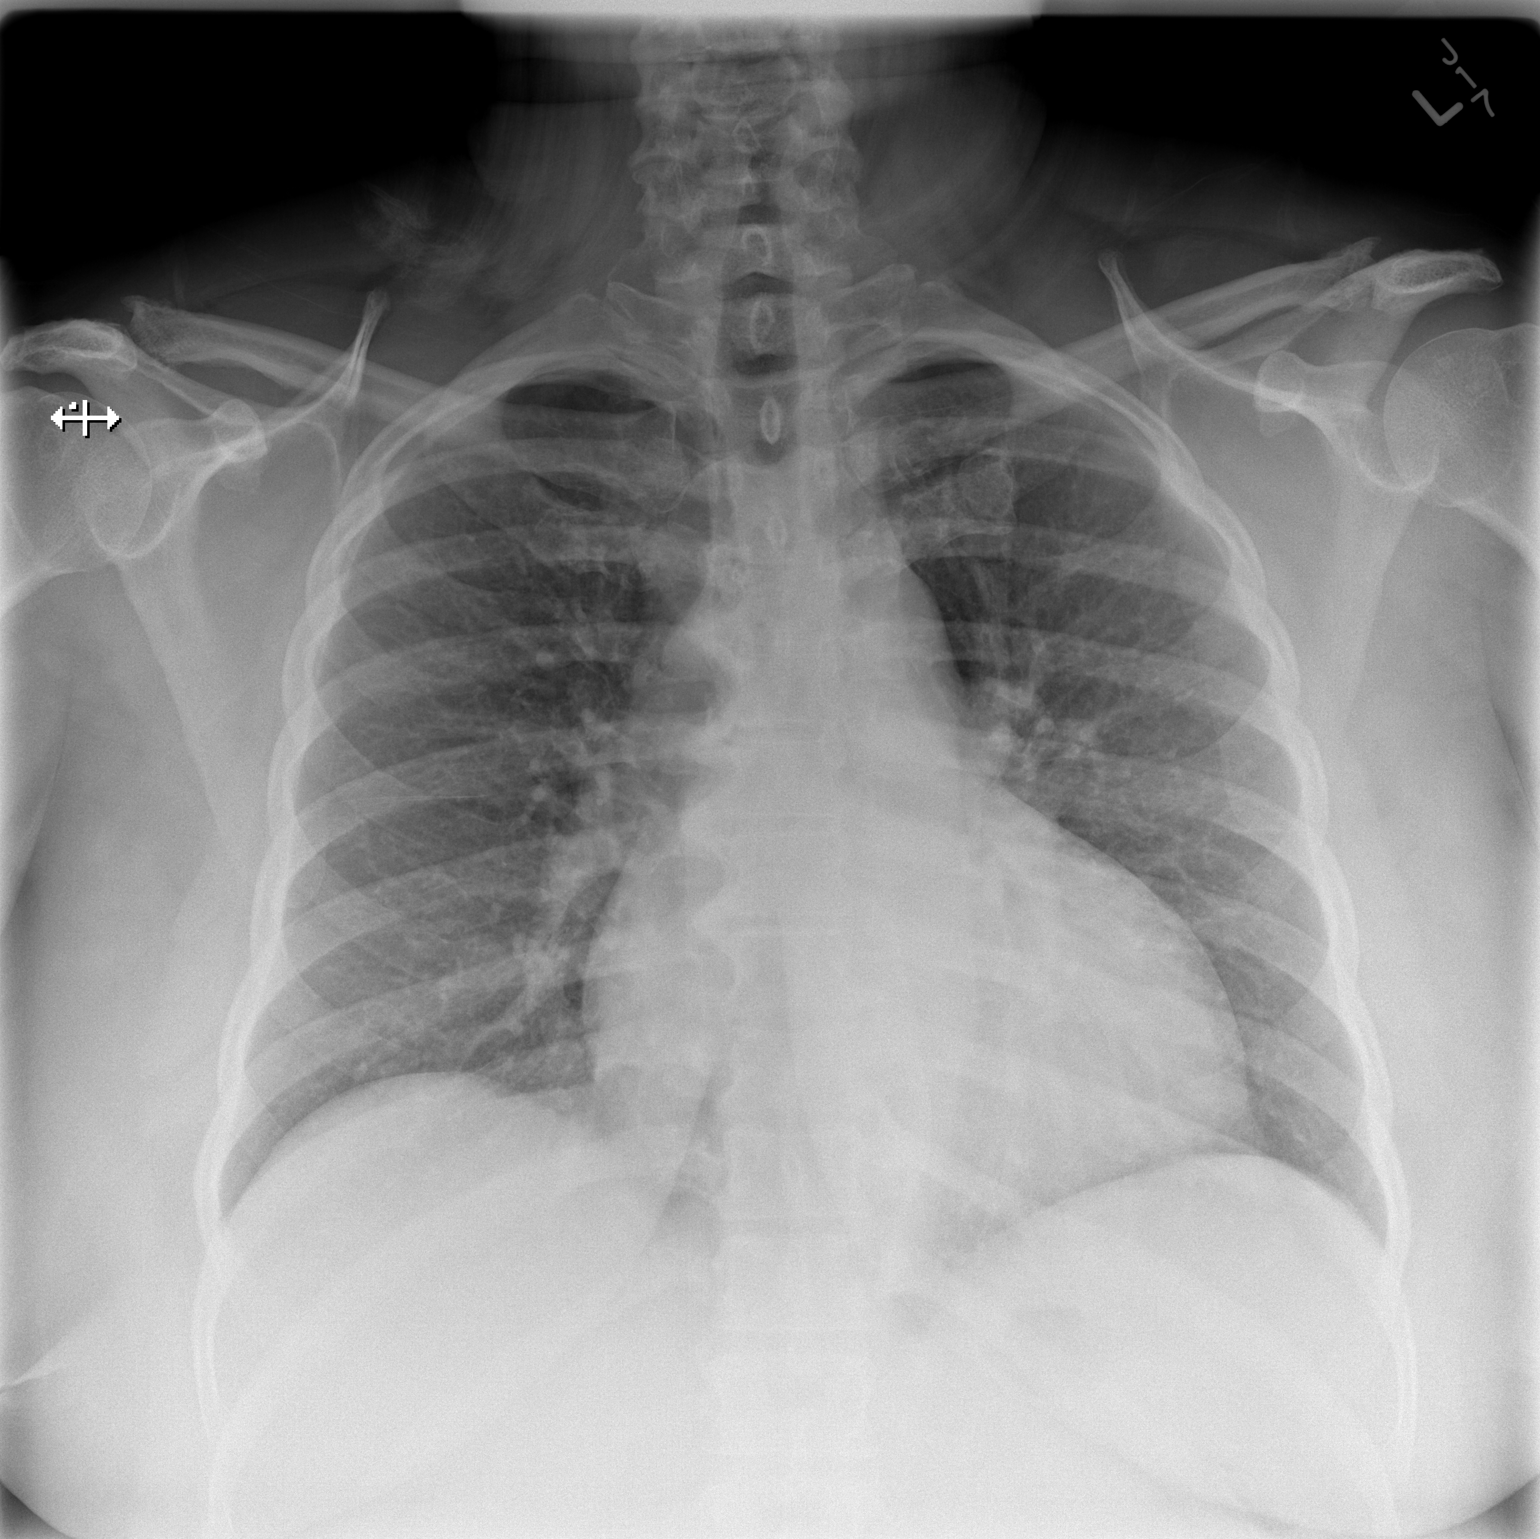

[w chest lat]
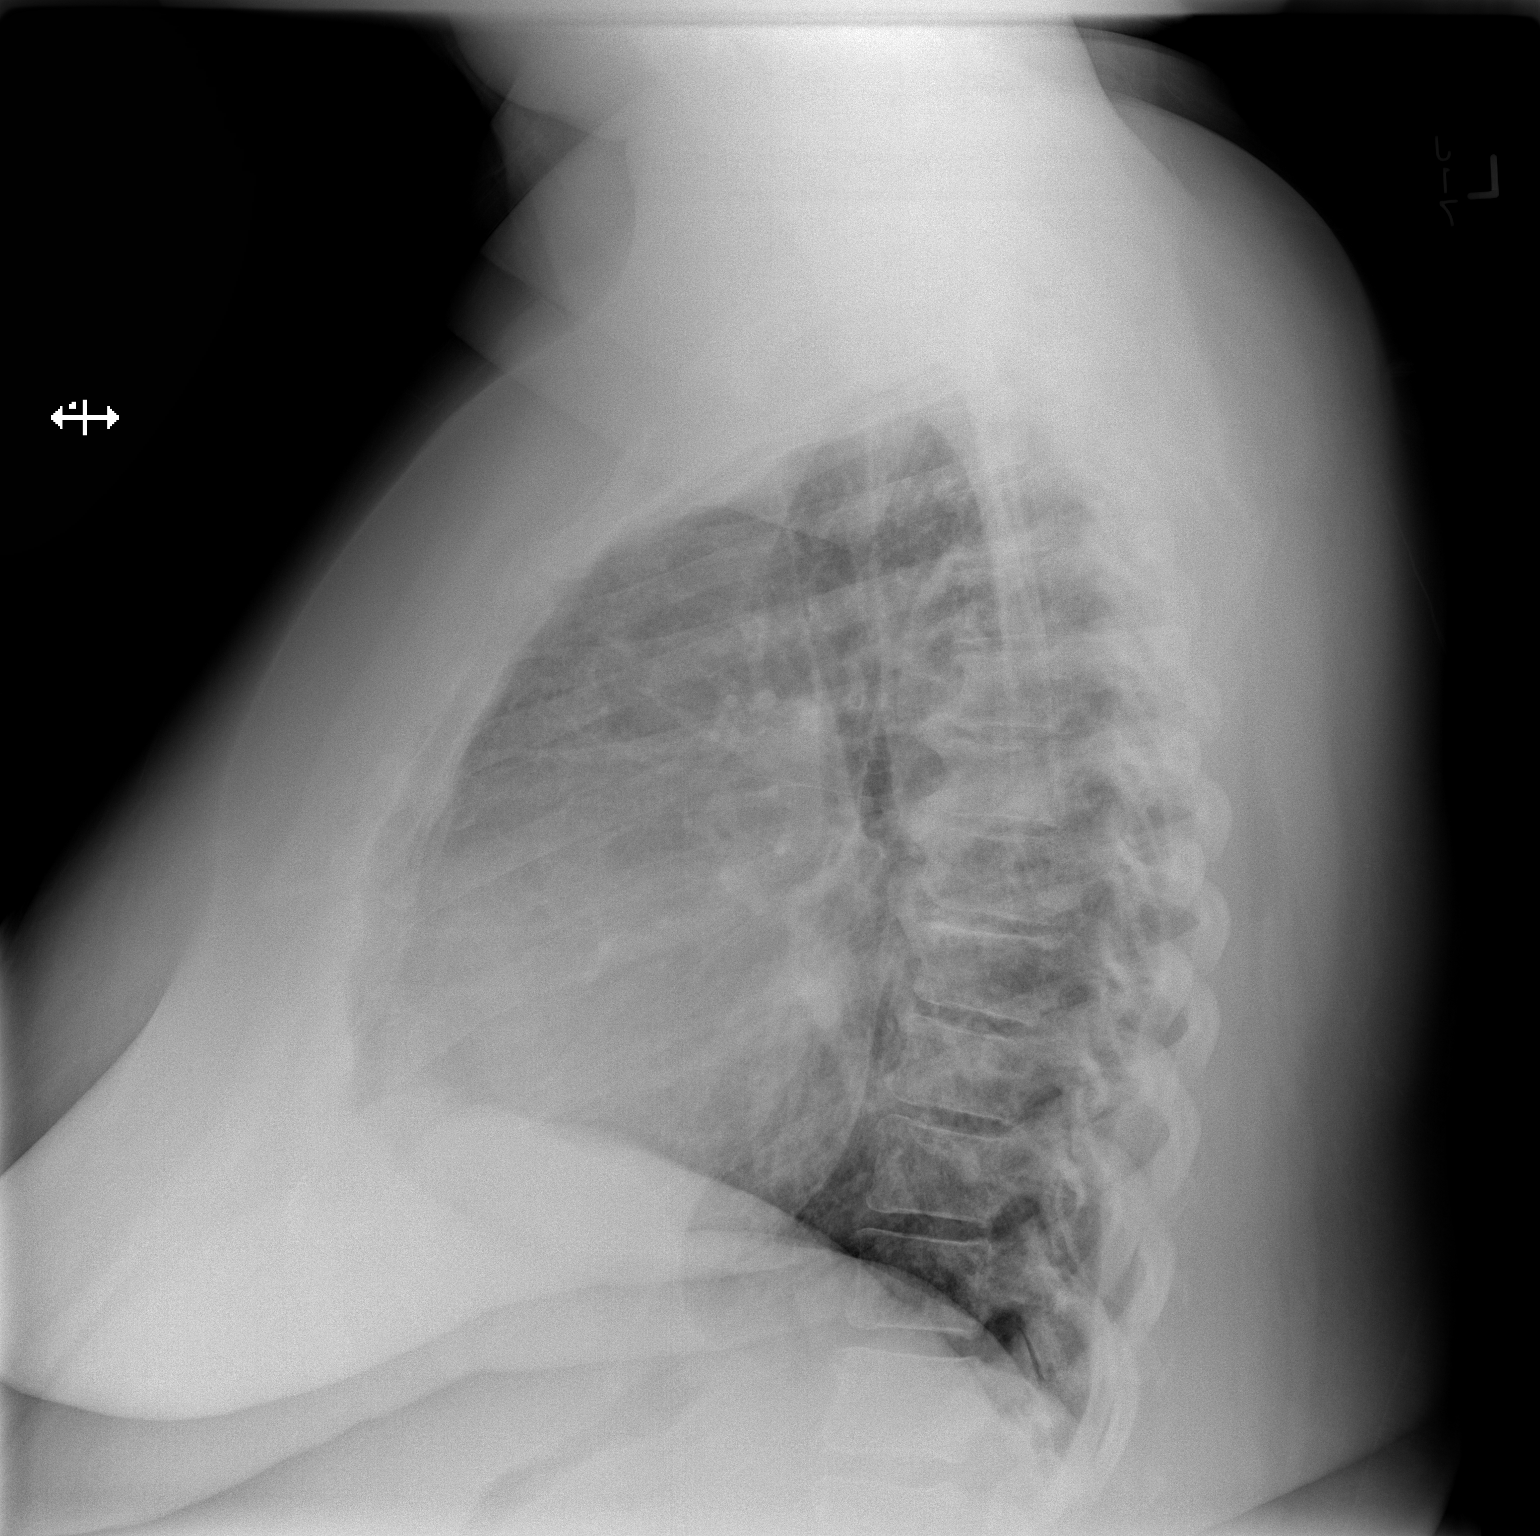

[2 of 2 positions shown; findings below may reference images not displayed]

FINDINGS: Cardiomediastinal silhouette is stable. No acute infiltrate or
pulmonary edema. Mild degenerative changes thoracic spine.
IMPRESSION: No active disease.  Mild degenerative changes thoracic spine.

## 2014-12-03 ENCOUNTER — Other Ambulatory Visit: Payer: Self-pay | Admitting: Pharmacist

## 2014-12-03 ENCOUNTER — Other Ambulatory Visit: Payer: Self-pay | Admitting: Internal Medicine

## 2014-12-03 DIAGNOSIS — E119 Type 2 diabetes mellitus without complications: Secondary | ICD-10-CM

## 2014-12-03 MED ORDER — TRUEPLUS LANCETS 28G MISC: Status: DC

## 2014-12-03 MED ORDER — TRUEPLUS LANCETS 30G MISC: Status: DC

## 2014-12-03 MED ORDER — TRUE METRIX METER W/DEVICE KIT: PACK | Status: DC

## 2014-12-03 MED ORDER — GLUCOSE BLOOD VI STRP: ORAL_STRIP | Status: DC

## 2014-12-03 MED ORDER — TRUERESULT BLOOD GLUCOSE W/DEVICE KIT: PACK | Status: DC

## 2015-01-17 ENCOUNTER — Encounter: Payer: Self-pay | Admitting: Internal Medicine

## 2015-01-17 ENCOUNTER — Ambulatory Visit: Payer: Medicaid Other | Attending: Internal Medicine | Admitting: Internal Medicine

## 2015-01-17 VITALS — BP 129/90 | HR 101 | Temp 98.2°F | Resp 20 | Ht 68.0 in | Wt 298.0 lb

## 2015-01-17 DIAGNOSIS — K219 Gastro-esophageal reflux disease without esophagitis: Secondary | ICD-10-CM | POA: Insufficient documentation

## 2015-01-17 DIAGNOSIS — L309 Dermatitis, unspecified: Secondary | ICD-10-CM | POA: Diagnosis not present

## 2015-01-17 DIAGNOSIS — R7309 Other abnormal glucose: Secondary | ICD-10-CM | POA: Insufficient documentation

## 2015-01-17 DIAGNOSIS — J45909 Unspecified asthma, uncomplicated: Secondary | ICD-10-CM | POA: Diagnosis not present

## 2015-01-17 DIAGNOSIS — M79606 Pain in leg, unspecified: Secondary | ICD-10-CM | POA: Diagnosis present

## 2015-01-17 DIAGNOSIS — L80 Vitiligo: Secondary | ICD-10-CM | POA: Diagnosis not present

## 2015-01-17 DIAGNOSIS — F419 Anxiety disorder, unspecified: Secondary | ICD-10-CM | POA: Diagnosis not present

## 2015-01-17 DIAGNOSIS — Z87891 Personal history of nicotine dependence: Secondary | ICD-10-CM | POA: Insufficient documentation

## 2015-01-17 DIAGNOSIS — R7303 Prediabetes: Secondary | ICD-10-CM

## 2015-01-17 DIAGNOSIS — F329 Major depressive disorder, single episode, unspecified: Secondary | ICD-10-CM | POA: Insufficient documentation

## 2015-01-17 DIAGNOSIS — Z79899 Other long term (current) drug therapy: Secondary | ICD-10-CM | POA: Diagnosis not present

## 2015-01-17 DIAGNOSIS — E785 Hyperlipidemia, unspecified: Secondary | ICD-10-CM | POA: Insufficient documentation

## 2015-01-17 DIAGNOSIS — I10 Essential (primary) hypertension: Secondary | ICD-10-CM | POA: Diagnosis not present

## 2015-01-17 LAB — GLUCOSE, POCT (MANUAL RESULT ENTRY): POC Glucose: 193 mg/dl — AB (ref 70–99)

## 2015-01-17 LAB — POCT GLYCOSYLATED HEMOGLOBIN (HGB A1C): Hemoglobin A1C: 5.9

## 2015-01-17 MED ORDER — HYDROXYZINE HCL 25 MG PO TABS
25.0000 mg | ORAL_TABLET | Freq: Three times a day (TID) | ORAL | Status: DC | PRN
Start: 2015-01-17 — End: 2015-06-21

## 2015-01-17 MED ORDER — ATORVASTATIN CALCIUM 20 MG PO TABS: 20.0000 mg | ORAL_TABLET | Freq: Every day | ORAL | Status: AC

## 2015-01-17 MED ORDER — TRIAMCINOLONE ACETONIDE 0.1 % EX CREA: 1.0000 "application " | TOPICAL_CREAM | Freq: Two times a day (BID) | CUTANEOUS | Status: DC

## 2015-01-17 MED ORDER — VERAPAMIL HCL ER 180 MG PO CP24: 180.0000 mg | ORAL_CAPSULE | Freq: Every day | ORAL | Status: DC

## 2015-01-17 NOTE — Progress Notes (Signed)
Patient ID: Beth [name], female   DOB: 13-Feb-1966, 49 y.o.   MRN: [id number]  CC: leg itching  HPI: Beth [name] is a 49 y.o. female here today for a follow up visit.  Patient has past medical history of HTN, vitiligo, HLD, depression, obesity, and GERD. Patient here with complaints of bilateral leg pain, burning, and itching. Patient rates pain 9/10, constant, for the past 3-4 weeks. Patient states nothing relieves her pain. She states that she has had dark, dry skin on her bilateral ankles for several weeks but over the past 2 weeks it has become more itchy causing burning. She states that it prevents her from sleeping at night. Patient also requests refills on all medications, has been out for 2 weeks.   Patient has No headache, No chest pain, No abdominal pain - No Nausea, No new weakness tingling or numbness, No Cough - SOB.  No Known Allergies Past Medical History  Diagnosis Date  . Hypertension   . Asthma   . Hyperlipidemia   . Phlebitis   . Asthmatic bronchitis , chronic   . Depression   . Anxiety   . Type II diabetes mellitus   . History of blood transfusion     "related to nose would not stop bleeding"  . GERD (gastroesophageal reflux disease)   . Arthritis     "knees" (12/14/2013)  . Hypertrophic cardiomyopathy   . Vitiligo    Current Outpatient Prescriptions on File Prior to Visit  Medication Sig Dispense Refill  . ARIPiprazole (ABILIFY) 20 MG tablet Take 1 tablet (20 mg total) by mouth daily. 90 tablet 3  . atorvastatin (LIPITOR) 20 MG tablet Take 1 tablet (20 mg total) by mouth daily. 30 tablet 3  . DULoxetine (CYMBALTA) 60 MG capsule Take 1 capsule (60 mg total) by mouth daily. 90 capsule 3  . fluticasone (FLONASE) 50 MCG/ACT nasal spray Place 1 spray into both nostrils daily. 16 g 6  . Fluticasone-Salmeterol (ADVAIR) 250-50 MCG/DOSE AEPB Inhale 1 puff into the lungs every 12 (twelve) hours. For shortness of breath 3 each 3  . gabapentin (NEURONTIN) 600 MG tablet  Take 1 tablet (600 mg total) by mouth 3 (three) times daily. 90 tablet 5  . meloxicam (MOBIC) 15 MG tablet Take 1 tablet (15 mg total) by mouth daily as needed for pain. Take with food 30 tablet 1  . sertraline (ZOLOFT) 100 MG tablet Take 1 tablet (100 mg total) by mouth daily. 30 tablet 2  . traMADol (ULTRAM) 50 MG tablet Take 1 tablet (50 mg total) by mouth every 8 (eight) hours as needed. (Patient taking differently: Take 50 mg by mouth every 8 (eight) hours as needed for moderate pain. ) 60 tablet 0  . VENTOLIN HFA 108 (90 BASE) MCG/ACT inhaler INHALE 2 PUFFS INTO THE LUNGS EVERY 4 HOURS AS NEEDED FOR WHEEZING OR SHORTNESS OF BREATH. (MAP) 54 each PRN  . verapamil (VERELAN) 180 MG 24 hr capsule Take 1 capsule (180 mg total) by mouth at bedtime. 30 capsule 6  . Blood Glucose Monitoring Suppl (TRUE METRIX METER) W/DEVICE KIT USE AS DIRECTED (Patient not taking: Reported on 01/17/2015) 1 kit 0  . glucose blood (TRUE METRIX BLOOD GLUCOSE TEST) test strip Use as instructed (Patient not taking: Reported on 01/17/2015) 100 each 12  . loratadine (CLARITIN) 10 MG tablet Take 1 tablet (10 mg total) by mouth daily. (Patient not taking: Reported on 01/17/2015) 30 tablet 5  . TRUEPLUS LANCETS 28G MISC CHECK 3 TIMES  DAILY (Patient not taking: Reported on 01/17/2015) 100 each 4  . [DISCONTINUED] buPROPion (WELLBUTRIN XL) 150 MG 24 hr tablet Take 150 mg by mouth daily.      . [DISCONTINUED] metoprolol succinate (TOPROL-XL) 100 MG 24 hr tablet Take 1 tablet (100 mg total) by mouth daily. 30 tablet 2  . [DISCONTINUED] pravastatin (PRAVACHOL) 40 MG tablet Take 40 mg by mouth daily.      . [DISCONTINUED] risperiDONE (RISPERDAL) 2 MG tablet Take 2 mg by mouth at bedtime.       No current facility-administered medications on file prior to visit.   Family History  Problem Relation Age of Onset  . Depression Mother   . Hypertension Mother   . Diabetes Mother   . Hypertension Father   . Hyperlipidemia Father   .  Depression Sister    History   Social History  . Marital Status: Divorced    Spouse Name: N/A  . Number of Children: 0  . Years of Education: N/A   Occupational History  . Not on file.   Social History Main Topics  . Smoking status: Former Smoker -- 0.25 packs/day for 19 years    Types: Cigarettes  . Smokeless tobacco: Never Used  . Alcohol Use: No     Comment: "quit alcohol ~ 2005; only then drank occasionl wine cooler"  . Drug Use: No     Comment: "LAST DRUG USE WAS IN 2005"  . Sexual Activity: Not Currently     Comment: "quit smoking cigarettes ~2005"   Other Topics Concern  . Not on file   Social History Narrative   Lives with sister and her children.      ,Review of Systems  Eyes: Negative for blurred vision.  Respiratory: Negative.   Cardiovascular: Negative.   Skin: Positive for itching and rash.  Neurological: Negative for dizziness.     Objective:   Filed Vitals:   01/17/15 1119  BP: 129/90  Pulse: 101  Temp: 98.2 F (36.8 C)  Resp: 20    Physical Exam  Constitutional: She is oriented to person, place, and time.  Cardiovascular: Normal rate, regular rhythm and normal heart sounds.   Pulmonary/Chest: Effort normal and breath sounds normal.  Neurological: She is alert and oriented to person, place, and time.  Skin: Skin is warm and dry. Rash noted.     Diffuse Vitiligo on BLE. Bilateral ankles with hyperpigmented dry patches, open ulcerated areas Hardened blister on left shin, non draining      Lab Results  Component Value Date   WBC 12.4* 10/12/2014   HGB 14.1 10/12/2014   HCT 41.7 10/12/2014   MCV 84.9 10/12/2014   PLT 327 10/12/2014   Lab Results  Component Value Date   CREATININE 0.84 09/28/2014   BUN 19 09/28/2014   NA 138 09/28/2014   K 3.9 09/28/2014   CL 102 09/28/2014   CO2 31 09/28/2014    Lab Results  Component Value Date   HGBA1C 5.90 01/17/2015   Lipid Panel     Component Value Date/Time   CHOL 253*  09/24/2014 2257   TRIG 170* 09/24/2014 2257   HDL 28* 09/24/2014 2257   CHOLHDL 9.0 09/24/2014 2257   VLDL 34 09/24/2014 2257   LDLCALC 191* 09/24/2014 2257       Assessment and plan:   Aniqua was seen today for leg pain and medication refill.  Diagnoses and all orders for this visit:  Eczema Orders: -     Begin  triamcinolone cream (KENALOG) 0.1 %; Apply 1 application topically 2 (two) times daily. -     Begin hydrOXYzine (ATARAX/VISTARIL) 25 MG tablet; Take 1 tablet (25 mg total) by mouth 3 (three) times daily as needed for itching. I have advised patient to go to Dermatology but she is unable to pay out of pocket cost at this time. Patchy areas appear consistent with eczema, will treat as such.   Essential hypertension Orders: -     Refill verapamil (VERELAN) 180 MG 24 hr capsule; Take 1 capsule (180 mg total) by mouth at bedtime.  HLD (hyperlipidemia) Orders: -    Refill atorvastatin (LIPITOR) 20 MG tablet; Take 1 tablet (20 mg total) by mouth daily. Education provided on proper lifestyle changes in order to lower cholesterol. Patient advised to maintain healthy weight and to keep total fat intake at 25-35% of total calories and carbohydrates 50-60% of total daily calories. Explained how high cholesterol places patient at risk for heart disease. Patient placed on appropriate medication and repeat labs in 6 months   Prediabetes Orders: -     Glucose (CBG) -     HgB A1c Weight loss, diet,, exercise advised.  Return in about 3 months (around 04/19/2015) for Hypertension.    Lance Bosch, [facility] [phone] 01/17/2015, 11:56 AM

## 2015-01-17 NOTE — Progress Notes (Signed)
Patient here with complaints of bilateral leg pain, burning, and itching.  Patient rates pain 9/10, constant, for the past 3-4 weeks.  Patient states nothing relieves her pain.  Patient also requests refills on all medications.

## 2015-01-17 NOTE — Patient Instructions (Addendum)
May try aleve for pain.  Eczema Eczema, also called atopic dermatitis, is a skin disorder that causes inflammation of the skin. It causes a red rash and dry, scaly skin. The skin becomes very itchy. Eczema is generally worse during the cooler winter months and often improves with the warmth of summer. Eczema usually starts showing signs in infancy. Some children outgrow eczema, but it may last through adulthood.  CAUSES  The exact cause of eczema is not known, but it appears to run in families. People with eczema often have a family history of eczema, allergies, asthma, or hay fever. Eczema is not contagious. Flare-ups of the condition may be caused by:   Contact with something you are sensitive or allergic to.   Stress. SIGNS AND SYMPTOMS  Dry, scaly skin.   Red, itchy rash.   Itchiness. This may occur before the skin rash and may be very intense.  DIAGNOSIS  The diagnosis of eczema is usually made based on symptoms and medical history. TREATMENT  Eczema cannot be cured, but symptoms usually can be controlled with treatment and other strategies. A treatment plan might include:  Controlling the itching and scratching.   Use over-the-counter antihistamines as directed for itching. This is especially useful at night when the itching tends to be worse.   Use over-the-counter steroid creams as directed for itching.   Avoid scratching. Scratching makes the rash and itching worse. It may also result in a skin infection (impetigo) due to a break in the skin caused by scratching.   Keeping the skin well moisturized with creams every day. This will seal in moisture and help prevent dryness. Lotions that contain alcohol and water should be avoided because they can dry the skin.   Limiting exposure to things that you are sensitive or allergic to (allergens).   Recognizing situations that cause stress.   Developing a plan to manage stress.  HOME CARE INSTRUCTIONS   Only take  over-the-counter or prescription medicines as directed by your health care provider.   Do not use anything on the skin without checking with your health care provider.   Keep baths or showers short (5 minutes) in warm (not hot) water. Use mild cleansers for bathing. These should be unscented. You may add nonperfumed bath oil to the bath water. It is best to avoid soap and bubble bath.   Immediately after a bath or shower, when the skin is still damp, apply a moisturizing ointment to the entire body. This ointment should be a petroleum ointment. This will seal in moisture and help prevent dryness. The thicker the ointment, the better. These should be unscented.   Keep fingernails cut short. Children with eczema may need to wear soft gloves or mittens at night after applying an ointment.   Dress in clothes made of cotton or cotton blends. Dress lightly, because heat increases itching.   A child with eczema should stay away from anyone with fever blisters or cold sores. The virus that causes fever blisters (herpes simplex) can cause a serious skin infection in children with eczema. SEEK MEDICAL CARE IF:   Your itching interferes with sleep.   Your rash gets worse or is not better within 1 week after starting treatment.   You see pus or soft yellow scabs in the rash area.   You have a fever.   You have a rash flare-up after contact with someone who has fever blisters.  Document Released: 06/08/2000 Document Revised: 04/01/2013 Document Reviewed: 01/12/2013 [employer] Patient  Information 2015 [employer]. This information is not intended to replace advice given to you by your health care provider. Make sure you discuss any questions you have with your health care provider.  

## 2015-01-18 ENCOUNTER — Ambulatory Visit (HOSPITAL_BASED_OUTPATIENT_CLINIC_OR_DEPARTMENT_OTHER): Payer: Medicaid Other | Attending: Cardiology | Admitting: Radiology

## 2015-01-18 DIAGNOSIS — G4733 Obstructive sleep apnea (adult) (pediatric): Secondary | ICD-10-CM | POA: Insufficient documentation

## 2015-01-18 DIAGNOSIS — R0683 Snoring: Secondary | ICD-10-CM | POA: Insufficient documentation

## 2015-01-18 DIAGNOSIS — I493 Ventricular premature depolarization: Secondary | ICD-10-CM | POA: Insufficient documentation

## 2015-01-18 DIAGNOSIS — G4730 Sleep apnea, unspecified: Secondary | ICD-10-CM

## 2015-01-25 ENCOUNTER — Telehealth: Payer: Self-pay | Admitting: Clinical

## 2015-01-25 NOTE — Telephone Encounter (Signed)
F/u w pt; intro to [facility] services 

## 2015-01-29 ENCOUNTER — Telehealth: Payer: Self-pay | Admitting: Cardiology

## 2015-01-29 DIAGNOSIS — Z9989 Dependence on other enabling machines and devices: Principal | ICD-10-CM

## 2015-01-29 DIAGNOSIS — G4733 Obstructive sleep apnea (adult) (pediatric): Secondary | ICD-10-CM

## 2015-01-29 NOTE — Telephone Encounter (Signed)
Please let patient know that they have sleep apnea but had unsuccessful CPAP titration due to severity of OSA.  Recommend BiPAP titration. Please set up titration in the sleep lab.

## 2015-01-29 NOTE — Sleep Study (Signed)
SLEEP STUDY    Patient Name: Beth [name], Beth [name] Date: 01/18/2015 MRN: [id number] Gender: Female D.O.B: [birth year] Age (years): 41 Referring Provider: Minus Breeding Interpreting Physician: Fransico Him MD, ABSM RPSGT: Carolin Coy  Height (inches): 68 Weight (lbs): 295 BMI: 45 Neck Size: 15.00   CLINICAL INFORMATION Sleep Study Type: Split Night CPAP  Indication for sleep study: Congestive Heart Failure, COPD, Diabetes, Fatigue, Hypertension, Obesity, OSA, Snorin  Epworth Sleepiness Score:17  SLEEP STUDY TECHNIQUE As per the AASM Manual for the Scoring of Sleep and Associated Events v2.3 (April 2016) with a hypopnea requiring 4% desaturations.  The channels recorded and monitored were frontal, central and occipital EEG, electrooculogram (EOG), submentalis EMG (chin), nasal and oral airflow, thoracic and abdominal wall motion, anterior tibialis EMG, snore microphone, electrocardiogram, and pulse oximetry. Continuous positive airway pressure (CPAP) was initiated when the patient met split night criteria and was titrated according to treat sleep-disordered breathing.  MEDICATIONS Medications taken by the patient : Abilify, Lipitor, Cymbalta, Flonase, Advair, Neurontin, Atarax, Meloxicam, Claritin, Zoloft, Ultram, Ventolin, Verapamil Medications administered by patient during sleep study : No sleep medicine administered.  RESPIRATORY PARAMETERS  Diagnostic Total AHI (/hr): 60.6   RDI (/hr):69.8    OA Index (/hr):21.3               CA Index (/hr):1.7 REM AHI (/hr): 54.2   NREM AHI (/hr):61.7    Supine AHI (/hr):N/A               Non-supine AHI (/hr):60.58 Min O2 Sat (%):75.00   Mean O2 (%):90.92    Time below 88% (min):19.2    Titration Optimal Pressure (cm):N/A  AHI at Optimal Pressure (/hr):N/A Min O2 at Optimal Pressur(%)77.00 Supine % at Optimal (%):N/A             Sleep % at Optimal (%):N/A    SLEEP ARCHITECTURE The recording time for the entire night was  403.4 minutes.  During a baseline period of 190.1 minutes, the patient slept for 104.0 minutes in REM and nonREM, yielding a sleep efficiency of 54.7% which is reduced. Sleep onset after lights out was prolonged at 40.9 minutes with a short REM latency of 35.0 minutes. The patient spent 15.38% of the night in stage N1 sleep, 69.71% in stage N2 sleep, 0.00% in stage N3 and 14.90% in REM.  During the titration period of 208.6 minutes, the patient slept for 198.0 minutes in REM and nonREM, yielding a normal sleep efficiency of 94.9%. Sleep onset after CPAP initiation was 0.1 minutes with a REM latency of 57.0 minutes. The patient spent 7.58% of the night in stage N1 sleep, 55.30% in stage N2 sleep, 0.00% in stage N3 and 37.12% in REM.  CARDIAC DATA The 2 lead EKG demonstrated sinus rhythm. The mean heart rate was 89.71 beats per minute. Other EKG findings include: PVCs.  LEG MOVEMENT DATA The total Periodic Limb Movements of Sleep (PLMS) were 61. The PLMS index was 12.12 .  IMPRESSIONS Severe obstructive sleep apnea occurred during the diagnostic portion of the study (AHI = 60.6/hour). An optimal PAP pressure could not be selected for this patient due to the severe nature of patient's sleep apnea and lack of study time for adequate titration.   The patient was tried but was not successful with CPAP titration due to continued respiratory events.  The AHI at a CPAP pressure of 18 cm H2O was 27.7/hour. No significant central sleep apnea occurred during the diagnostic portion of the study (CAI = 1.7/hour).  Severe oxygen desaturation was noted during the diagnostic portion of the study (Min O2 = 75.00%).  The total time spent with oxygen saturations < 88% was 19 minutes. The patient snored with Moderate snoring volume during the diagnostic portion of the study. EKG findings include PVCs. Clinically significant periodic limb movements did not occur during sleep.  DIAGNOSIS Obstructive Sleep Apnea  (327.23 [G47.33 ICD-10])  RECOMMENDATIONS Recommend BiPAP titration given sub-optimal CPAP titration. Avoid alcohol, sedatives and other CNS depressants that may worsen sleep apnea and disrupt normal sleep architecture. Sleep hygiene should be reviewed to assess factors that may improve sleep quality. Weight management and regular exercise should be initiated or continued.  Signed: Fransico Him, MD ABSM Diplomate, American Board of Sleep Medicine 01/29/2015

## 2015-02-01 ENCOUNTER — Encounter: Payer: Self-pay | Admitting: *Deleted

## 2015-02-01 NOTE — Addendum Note (Signed)
Addended by: Arcola Jansky on: 02/01/2015 08:37 AM   Modules accepted: Orders

## 2015-02-01 NOTE — Telephone Encounter (Signed)
Patient informed of results. Stated verbal understanding.   Will schedule BiPAP Titration and send her a letter with that date.

## 2015-03-15 ENCOUNTER — Ambulatory Visit: Payer: Self-pay

## 2015-04-15 ENCOUNTER — Encounter: Payer: Self-pay | Admitting: Internal Medicine

## 2015-04-15 ENCOUNTER — Ambulatory Visit: Payer: Medicaid Other | Attending: Internal Medicine | Admitting: Internal Medicine

## 2015-04-15 VITALS — BP 136/90 | HR 85 | Temp 97.7°F | Resp 16 | Ht 68.0 in | Wt 296.0 lb

## 2015-04-15 DIAGNOSIS — Z23 Encounter for immunization: Secondary | ICD-10-CM | POA: Insufficient documentation

## 2015-04-15 DIAGNOSIS — R7303 Prediabetes: Secondary | ICD-10-CM

## 2015-04-15 DIAGNOSIS — Z79899 Other long term (current) drug therapy: Secondary | ICD-10-CM | POA: Diagnosis not present

## 2015-04-15 DIAGNOSIS — I10 Essential (primary) hypertension: Secondary | ICD-10-CM | POA: Diagnosis not present

## 2015-04-15 DIAGNOSIS — Z1211 Encounter for screening for malignant neoplasm of colon: Secondary | ICD-10-CM | POA: Diagnosis not present

## 2015-04-15 DIAGNOSIS — Z6841 Body Mass Index (BMI) 40.0 and over, adult: Secondary | ICD-10-CM | POA: Insufficient documentation

## 2015-04-15 DIAGNOSIS — E669 Obesity, unspecified: Secondary | ICD-10-CM | POA: Diagnosis not present

## 2015-04-15 LAB — GLUCOSE, POCT (MANUAL RESULT ENTRY): POC GLUCOSE: 129 mg/dL — AB (ref 70–99)

## 2015-04-15 LAB — POCT GLYCOSYLATED HEMOGLOBIN (HGB A1C): HEMOGLOBIN A1C: 5.8

## 2015-04-15 MED ORDER — VERAPAMIL HCL ER 180 MG PO CP24: 180.0000 mg | ORAL_CAPSULE | Freq: Every day | ORAL | Status: DC

## 2015-04-15 MED ORDER — ALBUTEROL SULFATE HFA 108 (90 BASE) MCG/ACT IN AERS: INHALATION_SPRAY | RESPIRATORY_TRACT | Status: AC

## 2015-04-15 NOTE — Patient Instructions (Signed)
Take medication in morning over next week and we will check you again to make sure your BP is controlled before surgery

## 2015-04-15 NOTE — Progress Notes (Signed)
Patient ID: Beth [name], female   DOB: Jun 30, 1965, 49 y.o.   MRN: [id number] Subjective:  Beth [name] is a 49 y.o. female with hypertension and obesity.Patient is scheduled to have dental extraction of the whole top row of her mouth totaling 9 teeth. She was told at her dentist office that her BP was elevated and she needed to see her PCP before surgery. She states that she takes her BP medication at night.    Current Outpatient Prescriptions  Medication Sig Dispense Refill  . ARIPiprazole (ABILIFY) 20 MG tablet Take 1 tablet (20 mg total) by mouth daily. 90 tablet 3  . atorvastatin (LIPITOR) 20 MG tablet Take 1 tablet (20 mg total) by mouth daily. 30 tablet 3  . DULoxetine (CYMBALTA) 60 MG capsule Take 1 capsule (60 mg total) by mouth daily. 90 capsule 3  . fluticasone (FLONASE) 50 MCG/ACT nasal spray Place 1 spray into both nostrils daily. 16 g 6  . Fluticasone-Salmeterol (ADVAIR) 250-50 MCG/DOSE AEPB Inhale 1 puff into the lungs every 12 (twelve) hours. For shortness of breath 3 each 3  . gabapentin (NEURONTIN) 600 MG tablet Take 1 tablet (600 mg total) by mouth 3 (three) times daily. 90 tablet 5  . sertraline (ZOLOFT) 100 MG tablet Take 1 tablet (100 mg total) by mouth daily. 30 tablet 2  . VENTOLIN HFA 108 (90 BASE) MCG/ACT inhaler INHALE 2 PUFFS INTO THE LUNGS EVERY 4 HOURS AS NEEDED FOR WHEEZING OR SHORTNESS OF BREATH. (MAP) 54 each PRN  . verapamil (VERELAN) 180 MG 24 hr capsule Take 1 capsule (180 mg total) by mouth at bedtime. 30 capsule 6  . Blood Glucose Monitoring Suppl (TRUE METRIX METER) W/DEVICE KIT USE AS DIRECTED (Patient not taking: Reported on 01/17/2015) 1 kit 0  . glucose blood (TRUE METRIX BLOOD GLUCOSE TEST) test strip Use as instructed (Patient not taking: Reported on 01/17/2015) 100 each 12  . hydrOXYzine (ATARAX/VISTARIL) 25 MG tablet Take 1 tablet (25 mg total) by mouth 3 (three) times daily as needed for itching. (Patient not taking: Reported on 04/15/2015) 30 tablet  1  . TRUEPLUS LANCETS 28G MISC CHECK 3 TIMES DAILY (Patient not taking: Reported on 01/17/2015) 100 each 4  . [DISCONTINUED] buPROPion (WELLBUTRIN XL) 150 MG 24 hr tablet Take 150 mg by mouth daily.      . [DISCONTINUED] metoprolol succinate (TOPROL-XL) 100 MG 24 hr tablet Take 1 tablet (100 mg total) by mouth daily. 30 tablet 2  . [DISCONTINUED] pravastatin (PRAVACHOL) 40 MG tablet Take 40 mg by mouth daily.      . [DISCONTINUED] risperiDONE (RISPERDAL) 2 MG tablet Take 2 mg by mouth at bedtime.       No current facility-administered medications for this visit.    Hypertension ROS: taking medications as instructed, no medication side effects noted, no TIA's, no chest pain on exertion, no dyspnea on exertion, no swelling of ankles and no palpitations.   Objective:  BP 136/90 mmHg  Pulse 85  Temp(Src) 97.7 F (36.5 C)  Resp 16  Ht 5' 8"  (1.727 m)  Wt 296 lb (134.265 kg)  BMI 45.02 kg/m2  SpO2 100%  LMP 03/27/2014  Appearance alert, well appearing, and in no distress, oriented to person, place, and time and overweight. General exam BP noted to be controlled today in office, S1, S2 normal, no gallop, no murmur, chest clear, no JVD, no HSM, no edema.  Lab review: labs are reviewed, up to date and normal.   Beth [name] was seen  today for follow-up.  Diagnoses and all orders for this visit:  Essential hypertension -     verapamil (VERELAN) 180 MG 24 hr capsule; Take 1 capsule (180 mg total) by mouth at bedtime. I will have patient to begin taking medication in the morning for the next week and have her come back in 1 week for a recheck. If still normal I will send copy of last BP readings to dentist. If elevated she may benefit from low dose diuretic.   Pre-diabetes -     HgB A1c -     Glucose (CBG) Controlled. Weight loss, exercise, and diet all discussed in order to prevent onset of diabetes.  Colon cancer screening -     Ambulatory referral to Gastroenterology  Need for influenza  vaccination -     Flu Vaccine QUAD 36+ mos PF IM (Fluarix & Fluzone Quad PF)   Return in about 1 week (around 04/22/2015) for Nurse Visit-Bp check and 6 mo PCP HTN.   Lance Bosch, NP 04/15/2015 2:17 PM

## 2015-04-15 NOTE — Progress Notes (Signed)
Patient here for follow up on her HTN and in need of medication refills Patient also requesting medication for her acid reflux

## 2015-04-19 ENCOUNTER — Ambulatory Visit (INDEPENDENT_AMBULATORY_CARE_PROVIDER_SITE_OTHER): Payer: Medicaid Other | Admitting: Sports Medicine

## 2015-04-19 ENCOUNTER — Encounter: Payer: Self-pay | Admitting: Sports Medicine

## 2015-04-19 VITALS — BP 125/79 | HR 81 | Ht 68.0 in | Wt 296.0 lb

## 2015-04-19 DIAGNOSIS — M1712 Unilateral primary osteoarthritis, left knee: Secondary | ICD-10-CM

## 2015-04-19 DIAGNOSIS — M1711 Unilateral primary osteoarthritis, right knee: Secondary | ICD-10-CM

## 2015-04-19 DIAGNOSIS — M25562 Pain in left knee: Secondary | ICD-10-CM | POA: Diagnosis not present

## 2015-04-19 DIAGNOSIS — M25561 Pain in right knee: Secondary | ICD-10-CM

## 2015-04-19 MED ORDER — MELOXICAM 15 MG PO TABS: ORAL_TABLET | ORAL | Status: AC

## 2015-04-19 MED ORDER — METHYLPREDNISOLONE ACETATE 40 MG/ML IJ SUSP
40.0000 mg | Freq: Once | INTRAMUSCULAR | Status: AC
  Administered 2015-04-19: 40 mg via INTRA_ARTICULAR

## 2015-04-19 NOTE — Progress Notes (Signed)
   Subjective:    Patient ID: Beth [name], female    DOB: 05/06/1966, 49 y.o.   MRN: [id number]  HPI 49 y/o AA female with multiple medical problems presenting for bilateral knee pain.  [facility] and underwent bilateral knee CSI with approx 4+ months of relief (100%) then having gradual return of pain. Now pain is unbearable, only able to walk 5-10 minutes on flat surface before return of pain.  Less time walking if uphill or downhill.  Denies any swelling, popping, catching or instability.  Pain descbribed as anterior and deep on bilateral.  Equal on both sides.  No weakness, no parasthesia.   Review of Systems    10-point ROS unremarkable aside from the above.  PMHx: -- Reviewed  PSHx: -- Reviewed  Meds: -- Reviewed  Allergies: -- Reviewed  Social Hx: -- Reviewed  Objective:   Physical Exam   Gen: -- Obese, resting in NAD   Extr: -- White-colored discoloration of the LE bilaterally.  No notable change since last exam.  No edema.  Neurovascularly intact.  Cardio Pulm: -- Non-labored respirations  Bilateral knee exam: -- Bilateral TTP in joint lines. RIGHT knee very TTP along lateral joint line > Medial.  LEFT knee more TTP along medial joint line. -- 5/5 strength in flexion/extension.  Knee ROM decreased bilaterally 0-120 degrees secondary to pain but symmetric.  PROM intact to 140 degrees. -- Lachman and Anterior/posterior drawer negative.  Valgus/varus testing negative.  Patellar tracking midline. -- Ligamentous testing deferred given ambulatory status -- Ambulatory with single prong gait.  Extended time in stance phase of RIGHT leg relative to left.     Assessment & Plan:   49 y/o AA female [facility] on bilateral knee pain.  Patient with last CSI in Sept 2015 and had 100% pain relief for approach 5 months.  Now returning for intolerable symptoms, not controlled on PO pain medication alone.  The  patient was written a prescription for PO mobic.  Right knee intrarticular injection completed with ANTERIOR-MEDIAL APPROACH.  Left knee intra-articular injection with ANTERIOR-LATERAL APPROACH.  Both injections tolerated without difficulty.  Patient to f/u PRN, ideally after 6+ months for repeat injection.  May return sooner for acute issue with knees or other joints.  Injection Notes Below:  Consent obtained and verified. Time-out conducted. Noted no overlying erythema, induration, or other signs of local infection. Skin prepped in a sterile fashion. Topical analgesic spray: Ethyl chloride. Joint: RIGHT KNEE Needle: 22 gauge, 1.5 inch needle Completed without difficulty. Meds: 1 mL Depo-Medrol (40mg ) and 3mL 1% Xylocaine  Consent obtained and verified. Time-out conducted. Noted no overlying erythema, induration, or other signs of local infection. Skin prepped in a sterile fashion. Topical analgesic spray: Ethyl chloride. Joint: LEFT KNEE Needle: 22 gauge , 1.5 inch needle Completed without difficulty. Meds: 1 mL Depo-Medrol (40mg ) and 3mL 1% Xylocaine   Advised to call if fevers/chills, erythema, induration, drainage, or persistent bleeding.

## 2015-04-20 ENCOUNTER — Ambulatory Visit (HOSPITAL_BASED_OUTPATIENT_CLINIC_OR_DEPARTMENT_OTHER): Payer: Medicaid Other | Attending: Cardiology | Admitting: Radiology

## 2015-04-20 VITALS — Ht 68.0 in | Wt 295.0 lb

## 2015-04-20 DIAGNOSIS — G4733 Obstructive sleep apnea (adult) (pediatric): Secondary | ICD-10-CM | POA: Insufficient documentation

## 2015-04-20 DIAGNOSIS — I493 Ventricular premature depolarization: Secondary | ICD-10-CM | POA: Diagnosis not present

## 2015-04-20 DIAGNOSIS — G4730 Sleep apnea, unspecified: Secondary | ICD-10-CM | POA: Diagnosis present

## 2015-04-20 DIAGNOSIS — Z9989 Dependence on other enabling machines and devices: Secondary | ICD-10-CM

## 2015-04-24 ENCOUNTER — Telehealth: Payer: Self-pay | Admitting: Cardiology

## 2015-04-24 NOTE — Sleep Study (Signed)
   Patient Name: Beth [name], Beth [name] MRN: [id number] Study Date: 04/20/2015 Gender: Female D.O.B: Nov 17, 1965 Age (years): 49 Referring Provider: Armanda Magicraci Turner MD, ABSM Interpreting Physician: Armanda Magicraci Turner MD, ABSM RPSGT: Armen PickupFord, Evelyn  Weight (lbs): 295 Height (inches): 68 BMI: 45 Neck Size: 15.00  CLINICAL INFORMATION The patient is referred for a BiPAP titration to treat sleep apnea. Date of NPSG, Split Night or HST:01/17/2015  SLEEP STUDY TECHNIQUE As per the AASM Manual for the Scoring of Sleep and Associated Events v2.3 (April 2016) with a hypopnea requiring 4% desaturations. The channels recorded and monitored were frontal, central and occipital EEG, electrooculogram (EOG), submentalis EMG (chin), nasal and oral airflow, thoracic and abdominal wall motion, anterior tibialis EMG, snore microphone, electrocardiogram, and pulse oximetry. Bilevel positive airway pressure (BPAP) was initiated at the beginning of the study and titrated to treat sleep-disordered breathing.  MEDICATIONS Medications administered by patient during sleep study : No sleep medicine administered.  Home Medications:  Albuterol, Abilify, Atorvastatin, Cymbalta, Flonase, Advair, Gabapentin, Atarax, Meloxicam, Zoloft, Verapamil.   RESPIRATORY PARAMETERS Optimal IPAP Pressure (cm): 18 AHI at Optimal Pressure (/hr) 3.4 Optimal EPAP Pressure (cm):14     Overall Minimal O2 (%):84.00  Minimal O2 at Optimal Pressure (%):90.0  SLEEP ARCHITECTURE Start Time:9:43:21 PM Stop Time:4:27:22 AM Total Time (min):404.0 Total Sleep Time (min):340.0 Sleep Latency (min):18.4 Sleep Efficiency (%):84.2 REM Latency (min):134.0 WASO (min):45.6 Stage N1 (%): 3.82  Stage N2 (%): 60.00  Stage N3 (%): 0.00  Stage R (%):36.18 Supine (%):23.82  Arousal Index (/hr):41.1      CARDIAC DATA The 2 lead EKG demonstrated sinus rhythm. The mean heart rate was 79.11 beats per minute. Other EKG findings include: PVCs.  LEG MOVEMENT DATA The total  Periodic Limb Movements of Sleep (PLMS) were 222. The PLMS index was 39.18. A PLMS index of <15 is considered normal in adults.  IMPRESSIONS - An optimal PAP pressure was selected for this patient of 18/14 cm of water. - Central sleep apnea was not noted during this titration (CAI = 2.1/h). - Moderate oxygen desaturations were observed during this titration (min O2 = 84.00%). - No snoring was audible during this study. - 2-lead EKG demonstrated: PVCs - Moderate periodic limb movements were observed during this study. Arousals associated with PLMs were rare.  DIAGNOSIS - Obstructive Sleep Apnea (327.23 [G47.33 ICD-10])  RECOMMENDATIONS - Trial of BiPAP therapy on 18/14 cm H2O with a Medium size Resmed Full Face Mask AirFit F10 for Her mask and heated humidification. - Avoid alcohol, sedatives and other CNS depressants that may worsen sleep apnea and disrupt normal sleep architecture. - Sleep hygiene should be reviewed to assess factors that may improve sleep quality. - Weight management and regular exercise should be initiated or continued. - Return to Sleep Center for re-evaluation after 10 weeks of therapy    Quintella ReichertURNER,TRACI R Diplomate, American Board of Sleep Medicine  ELECTRONICALLY SIGNED ON:  04/24/2015, 8:30 PM [facility] PH: [phone]   FX: [phone] ACCREDITED BY THE AMERICAN ACADEMY OF SLEEP MEDICINE

## 2015-04-24 NOTE — Telephone Encounter (Signed)
Pt had successful PAP titration. Please setup appointment in 10 weeks. Please let [facility] know that order for PAP is in EPIC.   

## 2015-04-24 NOTE — Addendum Note (Signed)
Addended by: Quintella ReichertURNER, TRACI R on: 04/24/2015 08:35 PM   Modules accepted: Orders

## 2015-04-25 NOTE — Telephone Encounter (Signed)
Patient informed of results. Stated verbal understanding. [facility] notified of orders. Once patient is setup with machine, I will schedule 10 week follow-up.

## 2015-04-26 ENCOUNTER — Ambulatory Visit: Payer: Medicaid Other | Attending: Internal Medicine | Admitting: Pharmacist

## 2015-04-26 ENCOUNTER — Other Ambulatory Visit: Payer: Self-pay | Admitting: Pharmacist

## 2015-04-26 VITALS — BP 134/86 | HR 75

## 2015-04-26 DIAGNOSIS — Z79899 Other long term (current) drug therapy: Secondary | ICD-10-CM | POA: Insufficient documentation

## 2015-04-26 DIAGNOSIS — I10 Essential (primary) hypertension: Secondary | ICD-10-CM | POA: Diagnosis not present

## 2015-04-26 MED ORDER — GLUCOSE BLOOD VI STRP: ORAL_STRIP | Status: AC

## 2015-04-26 MED ORDER — GLUCOSE BLOOD VI STRP: ORAL_STRIP | Status: DC

## 2015-04-26 MED ORDER — ACCU-CHEK SOFT TOUCH LANCETS MISC: Status: AC

## 2015-04-26 MED ORDER — ACCU-CHEK AVIVA PLUS W/DEVICE KIT: PACK | Status: AC

## 2015-04-26 MED ORDER — TRUE METRIX METER W/DEVICE KIT: PACK | Status: DC

## 2015-04-26 MED ORDER — TRUEPLUS LANCETS 28G MISC: Status: DC

## 2015-04-26 NOTE — Patient Instructions (Signed)
Your blood pressure looks good today!  DASH Eating Plan DASH stands for "Dietary Approaches to Stop Hypertension." The DASH eating plan is a healthy eating plan that has been shown to reduce high blood pressure (hypertension). Additional health benefits may include reducing the risk of type 2 diabetes mellitus, heart disease, and stroke. The DASH eating plan may also help with weight loss. WHAT DO I NEED TO KNOW ABOUT THE DASH EATING PLAN? For the DASH eating plan, you will follow these general guidelines:  Choose foods with a percent daily value for sodium of less than 5% (as listed on the food label).  Use salt-free seasonings or herbs instead of table salt or sea salt.  Check with your health care provider or pharmacist before using salt substitutes.  Eat lower-sodium products, often labeled as "lower sodium" or "no salt added."  Eat fresh foods.  Eat more vegetables, fruits, and low-fat dairy products.  Choose whole grains. Look for the word "whole" as the first word in the ingredient list.  Choose fish and skinless chicken or Mal[place]key more often than red meat. Limit fish, poultry, and meat to 6 oz (170 g) each day.  Limit sweets, desserts, sugars, and sugary drinks.  Choose heart-healthy fats.  Limit cheese to 1 oz (28 g) per day.  Eat more home-cooked food and less restaurant, buffet, and fast food.  Limit fried foods.  Cook foods using methods other than frying.  Limit canned vegetables. If you do use them, rinse them well to decrease the sodium.  When eating at a restaurant, ask that your food be prepared with less salt, or no salt if possible. WHAT FOODS CAN I EAT? Seek help from a dietitian for individual calorie needs. Grains Whole grain or whole wheat bread. Brown rice. Whole grain or whole wheat pasta. Quinoa, bulgur, and whole grain cereals. Low-sodium cereals. Corn or whole wheat flour tortillas. Whole grain cornbread. Whole grain crackers. Low-sodium  crackers. Vegetables Fresh or frozen vegetables (raw, steamed, roasted, or grilled). Low-sodium or reduced-sodium tomato and vegetable juices. Low-sodium or reduced-sodium tomato sauce and paste. Low-sodium or reduced-sodium canned vegetables.  Fruits All fresh, canned (in natural juice), or frozen fruits. Meat and Other Protein Products Ground beef (85% or leaner), grass-fed beef, or beef trimmed of fat. Skinless chicken or Mal[place]key. Ground chicken or [place]key. Pork trimmed of fat. All fish and seafood. Eggs. Dried beans, peas, or lentils. Unsalted nuts and seeds. Unsalted canned beans. Dairy Low-fat dairy products, such as skim or 1% milk, 2% or reduced-fat cheeses, low-fat ricotta or cottage cheese, or plain low-fat yogurt. Low-sodium or reduced-sodium cheeses. Fats and Oils Tub margarines without trans fats. Light or reduced-fat mayonnaise and salad dressings (reduced sodium). Avocado. Safflower, olive, or canola oils. Natural peanut or almond butter. Other Unsalted popcorn and pretzels. The items listed above may not be a complete list of recommended foods or beverages. Contact your dietitian for more options. WHAT FOODS ARE NOT RECOMMENDED? Grains White bread. White pasta. White rice. Refined cornbread. Bagels and croissants. Crackers that contain trans fat. Vegetables Creamed or fried vegetables. Vegetables in a cheese sauce. Regular canned vegetables. Regular canned tomato sauce and paste. Regular tomato and vegetable juices. Fruits Dried fruits. Canned fruit in light or heavy syrup. Fruit juice. Meat and Other Protein Products Fatty cuts of meat. Ribs, chicken wings, bacon, sausage, bologna, salami, chitterlings, fatback, hot dogs, bratwurst, and packaged luncheon meats. Salted nuts and seeds. Canned beans with salt. Dairy Whole or 2% milk, cream, half-and-half, and  cream cheese. Whole-fat or sweetened yogurt. Full-fat cheeses or blue cheese. Nondairy creamers and whipped toppings.  Processed cheese, cheese spreads, or cheese curds. Condiments Onion and garlic salt, seasoned salt, table salt, and sea salt. Canned and packaged gravies. Worcestershire sauce. Tartar sauce. Barbecue sauce. Teriyaki sauce. Soy sauce, including reduced sodium. Steak sauce. Fish sauce. Oyster sauce. Cocktail sauce. Horseradish. Ketchup and mustard. Meat flavorings and tenderizers. Bouillon cubes. Hot sauce. Tabasco sauce. Marinades. Taco seasonings. Relishes. Fats and Oils Butter, stick margarine, lard, shortening, ghee, and bacon fat. Coconut, palm kernel, or palm oils. Regular salad dressings. Other Pickles and olives. Salted popcorn and pretzels. The items listed above may not be a complete list of foods and beverages to avoid. Contact your dietitian for more information. WHERE CAN I FIND MORE INFORMATION? National Heart, Lung, and Blood Institute: travelstabloid.com   This information is not intended to replace advice given to you by your health care provider. Make sure you discuss any questions you have with your health care provider.   Document Released: 05/31/2011 Document Revised: 07/02/2014 Document Reviewed: 04/15/2013 Elsevier Interactive Patient Education [employer].

## 2015-04-26 NOTE — Progress Notes (Signed)
S:    Patient arrives in good spirits.    Presents to the clinic for hypertension evaluation.   Patient reports adherence with medications.  Current BP Medications include:  Verapamil 180 mg every morning.   O:   Last 3 Office BP readings: BP Readings from Last 3 Encounters:  04/26/15 134/86  04/19/15 125/79  04/15/15 136/90    BMET    Component Value Date/Time   NA 138 09/28/2014 0730   K 3.9 09/28/2014 0730   CL 102 09/28/2014 0730   CO2 31 09/28/2014 0730   GLUCOSE 134* 09/28/2014 0730   BUN 19 09/28/2014 0730   CREATININE 0.84 09/28/2014 0730   CREATININE 0.78 12/05/2012 1118   CALCIUM 8.5 09/28/2014 0730   GFRNONAA 80* 09/28/2014 0730   GFRAA >90 09/28/2014 0730    A/P: History of hypertension currently controlled on current medications.  No recommendations for any changes to medications and patient will continue to take medications as prescribed. Patient also needs a new blood glucose meter - will send prescription to the pharmacy.   Medication reconciliation completed. Results reviewed and written information provided.   Total time in face-to-face counseling 20 minutes.   F/U Clinic Visit with Holland CommonsValerie Keck, NP, as directed.

## 2015-06-28 ENCOUNTER — Encounter (HOSPITAL_COMMUNITY): Payer: Self-pay

## 2015-06-28 ENCOUNTER — Encounter (HOSPITAL_COMMUNITY)
Admission: RE | Admit: 2015-06-28 | Discharge: 2015-06-28 | Disposition: A | Discharge status: Home / Self Care | Payer: Medicaid Other | Source: Ambulatory Visit | Attending: Oral Surgery | Admitting: Oral Surgery

## 2015-06-28 DIAGNOSIS — J45909 Unspecified asthma, uncomplicated: Secondary | ICD-10-CM | POA: Insufficient documentation

## 2015-06-28 DIAGNOSIS — Z01812 Encounter for preprocedural laboratory examination: Secondary | ICD-10-CM | POA: Insufficient documentation

## 2015-06-28 DIAGNOSIS — F419 Anxiety disorder, unspecified: Secondary | ICD-10-CM | POA: Insufficient documentation

## 2015-06-28 DIAGNOSIS — Z01818 Encounter for other preprocedural examination: Secondary | ICD-10-CM | POA: Diagnosis not present

## 2015-06-28 DIAGNOSIS — I10 Essential (primary) hypertension: Secondary | ICD-10-CM | POA: Diagnosis not present

## 2015-06-28 DIAGNOSIS — E785 Hyperlipidemia, unspecified: Secondary | ICD-10-CM | POA: Diagnosis not present

## 2015-06-28 DIAGNOSIS — E119 Type 2 diabetes mellitus without complications: Secondary | ICD-10-CM | POA: Diagnosis not present

## 2015-06-28 DIAGNOSIS — Z79899 Other long term (current) drug therapy: Secondary | ICD-10-CM | POA: Insufficient documentation

## 2015-06-28 DIAGNOSIS — G4733 Obstructive sleep apnea (adult) (pediatric): Secondary | ICD-10-CM | POA: Insufficient documentation

## 2015-06-28 DIAGNOSIS — M270 Developmental disorders of jaws: Secondary | ICD-10-CM | POA: Diagnosis not present

## 2015-06-28 DIAGNOSIS — I422 Other hypertrophic cardiomyopathy: Secondary | ICD-10-CM | POA: Insufficient documentation

## 2015-06-28 DIAGNOSIS — Z87891 Personal history of nicotine dependence: Secondary | ICD-10-CM | POA: Diagnosis not present

## 2015-06-28 DIAGNOSIS — K029 Dental caries, unspecified: Secondary | ICD-10-CM | POA: Insufficient documentation

## 2015-06-28 HISTORY — DX: Sleep apnea, unspecified: G47.30

## 2015-06-28 HISTORY — DX: Dental caries, unspecified: K02.9

## 2015-06-28 HISTORY — DX: Obesity, unspecified: E66.9

## 2015-06-28 HISTORY — DX: Reserved for inherently not codable concepts without codable children: IMO0001

## 2015-06-28 LAB — CBC
HEMATOCRIT: 41.6 % (ref 36.0–46.0)
HEMOGLOBIN: 13.3 g/dL (ref 12.0–15.0)
MCH: 28.1 pg (ref 26.0–34.0)
MCHC: 32 g/dL (ref 30.0–36.0)
MCV: 87.9 fL (ref 78.0–100.0)
Platelets: 305 10*3/uL (ref 150–400)
RBC: 4.73 MIL/uL (ref 3.87–5.11)
RDW: 15 % (ref 11.5–15.5)
WBC: 12.6 10*3/uL — ABNORMAL HIGH (ref 4.0–10.5)

## 2015-06-28 LAB — BASIC METABOLIC PANEL
ANION GAP: 9 (ref 5–15)
BUN: 6 mg/dL (ref 6–20)
CO2: 25 mmol/L (ref 22–32)
Calcium: 9.1 mg/dL (ref 8.9–10.3)
Chloride: 107 mmol/L (ref 101–111)
Creatinine, Ser: 0.66 mg/dL (ref 0.44–1.00)
GFR calc Af Amer: >60 mL/min (ref >60)
GLUCOSE: 99 mg/dL (ref 65–99)
POTASSIUM: 3.6 mmol/L (ref 3.5–5.1)
Sodium: 141 mmol/L (ref 135–145)

## 2015-06-28 LAB — GLUCOSE, CAPILLARY: GLUCOSE-CAPILLARY: 113 mg/dL — AB (ref 65–99)

## 2015-06-28 MED FILL — VERAPAMIL ER 180 MG TABLET: 180 | 30 days supply | Qty: 30 | Fill #0

## 2015-06-28 NOTE — H&P (Signed)
HISTORY AND PHYSICAL  Beth [name] is a 50 y.o. female patient with CC: painful teeth  No diagnosis found.  Past Medical History  Diagnosis Date  . Hypertension   . Asthma   . Hyperlipidemia   . Phlebitis   . Asthmatic bronchitis , chronic ([place])   . Depression   . Anxiety   . Type II diabetes mellitus ([place])   . History of blood transfusion     "related to nose would not stop bleeding"  . GERD (gastroesophageal reflux disease)   . Arthritis     "knees" (12/14/2013)  . Hypertrophic cardiomyopathy ([place])   . Vitiligo     No current facility-administered medications for this encounter.   Current Outpatient Prescriptions  Medication Sig Dispense Refill  . albuterol (VENTOLIN HFA) 108 (90 BASE) MCG/ACT inhaler INHALE 2 PUFFS INTO THE LUNGS EVERY 4 HOURS AS NEEDED FOR WHEEZING OR SHORTNESS OF BREATH. (MAP) 54 each PRN  . ARIPiprazole (ABILIFY) 20 MG tablet Take 1 tablet (20 mg total) by mouth daily. 90 tablet 3  . atorvastatin (LIPITOR) 20 MG tablet Take 1 tablet (20 mg total) by mouth daily. 30 tablet 3  . Blood Glucose Monitoring Suppl (ACCU-CHEK AVIVA PLUS) W/DEVICE KIT USE AS INSTRUCTED 1 kit 0  . DULoxetine (CYMBALTA) 60 MG capsule Take 1 capsule (60 mg total) by mouth daily. 90 capsule 3  . fluticasone (FLONASE) 50 MCG/ACT nasal spray Place 1 spray into both nostrils daily. 16 g 6  . Fluticasone-Salmeterol (ADVAIR) 250-50 MCG/DOSE AEPB Inhale 1 puff into the lungs every 12 (twelve) hours. For shortness of breath 3 each 3  . gabapentin (NEURONTIN) 600 MG tablet Take 1 tablet (600 mg total) by mouth 3 (three) times daily. 90 tablet 5  . glucose blood (ACCU-CHEK AVIVA PLUS) test strip Use as instructed 100 each 12  . Lancets (ACCU-CHEK SOFT TOUCH) lancets Use as instructed 100 each 12  . meloxicam (MOBIC) 15 MG tablet Take one tablet daily as needed 60 tablet 1  . sertraline (ZOLOFT) 100 MG tablet Take 1 tablet (100 mg total) by mouth daily. 30 tablet 2  . verapamil (VERELAN)  180 MG 24 hr capsule Take 1 capsule (180 mg total) by mouth at bedtime. (Patient taking differently: Take 180 mg by mouth daily before breakfast. ) 30 capsule 6  . [DISCONTINUED] buPROPion (WELLBUTRIN XL) 150 MG 24 hr tablet Take 150 mg by mouth daily.      . [DISCONTINUED] metoprolol succinate (TOPROL-XL) 100 MG 24 hr tablet Take 1 tablet (100 mg total) by mouth daily. 30 tablet 2  . [DISCONTINUED] pravastatin (PRAVACHOL) 40 MG tablet Take 40 mg by mouth daily.      . [DISCONTINUED] risperiDONE (RISPERDAL) 2 MG tablet Take 2 mg by mouth at bedtime.       No Known Allergies Active Problems:   * No active hospital problems. *  Vitals: Last menstrual period 03/27/2014. Lab results:No results found for this or any previous visit (from the past 76 hour(s)). Radiology Results: No results found. General appearance: alert, cooperative and morbidly obese Head: Normocephalic, without obvious abnormality, atraumatic Eyes: negative Nose: Nares normal. Septum midline. Mucosa normal. No drainage or sinus tenderness. Throat: multiple carious teeth #2,3,4,6,7,8,9,11,15, bilateral mandibular lingual tori. Pharynx clear. Neck: no adenopathy, supple, symmetrical, trachea midline and thyroid not enlarged, symmetric, no tenderness/mass/nodules Resp: clear to auscultation bilaterally Cardio: regular rate and rhythm, S1, S2 normal, no murmur, click, rub or gallop  Assessment:Multiple nonrestorable teeth. Bilateral mandibular lingual tori  Plan:Multiple dental extractions with alveoloplasty. Removal bilateral mandibular lingual tori. General anesthesia. Day surgery.   Sura Canul M 06/28/2015

## 2015-06-28 NOTE — Progress Notes (Signed)
Pt denies any acute cardiopulmonary issues. Pt is under the care of Dr. Mayford Knifeurner, cardiology and Dr. Antoine PocheHochrein, cardiology. Pt chart forwarded to [place], [place], Anesthesia for review of history ( see note).

## 2015-06-28 NOTE — Progress Notes (Signed)
Anesthesia PAT Evaluation: Patient is a 50 year old female scheduled for bilateral multiple teeth extractions with alveoloplasty on 07/01/15 by Dr. Barbette MerinoJensen.  History includes former smoker, hypertrophic cardiomyopathy, HTN, HLD, asthma, anxiety, DM2, arthritis, exertional dysnpea, vitiligo, OSA with BiPap, cholecystectomy, UHR, epistaxis (denied nasal fracture history). Reported last cocaine, marijuana, ETOH use was in 2005. BMI is consistent with morbid obesity. Patient lives with her sister. She says she is compliant with her BiPap. She is scheduled for a routine screening colonoscopy tomorrow.   PCP is listed as Dr. Jeanann Lewandowskylugbemiga Jegede. Cardiologist is Dr. Antoine PocheHochrein, last visit 5/19/016. Her OSA is followed by cardiologist Dr. Armanda Magicraci Turner.   PAT Vitals: BP 148/87, HR 79, RR 20, T 36.7C, O2 sat 95%. CBG 113.  Meds includes albuterol, Abilify, Lipitor, Cymbalta, Flonase, Advair, Neurontin, Zoloft, verapamil.   09/27/14 EKG: NSR.  09/25/14 Echo: Study Conclusions - Left ventricle: The cavity size was normal. Wall thickness was increased in a pattern of moderate LVH. There was severe focal basal hypertrophy of the septum (1.7 cm). This is possibly consistent with hypertrophic cardiomyopathy - no LVOT gradient was noted. Systolic function was normal. The estimated ejection fraction was in the range of 60% to 65%. Doppler parameters are consistent with abnormal left ventricular relaxation (grade 1 diastolic dysfunction). The E/e&' ratio is between 8-15, suggesting indeterminate LV fililng pressure. - Left atrium: Severely dilated at 52 ml/m2. Impressions: - Compared to a prior echo in 2015, there is now at least moderate LV wall thickening and severe left atrial enlargment. This may represent hypertrophic cardiomyopathy - no outflow tract gradient was noted. Consider cardiac MRI to further evaluate. (11/11/14 notes by Dr. Antoine PocheHochrein indicate that "MRI was attempted but because her  weight she could not have this.")  09/24/14 CXR: IMPRESSION: Congestive heart failure pulmonary interstitial edema.  Preoperative labs noted. A1c pending (last 5.8 on 04/15/15). She occasional checks random home CBG which run ~ 140's, but is not sure of fasting results.    Patient reports she feels stable from a cardiopulmonary standpoint. She denied chest pain, SOB at rest, syncope, weight changes. She has stable exertional dyspnea that occurs after walking ~ 2-3 minutes on flat surfaces. She does not sleep flat on her back due to discomfort--sleeps on her side. She reports medication compliance. On exam, heart RRR, no murmur noted. No carotid bruits noted. Lungs clear.   Above reviewed with anesthesiologist Dr. Noreene LarssonJoslin. He also reviewed 09/2014 echo results. Severe focal basal hypertrophy 1.7 cm with no LVOT gradient. She was unable to get a cardiac MRI. Dr. Antoine PocheHochrein is treating her with verapamil (no b-blocker currently due to asthma). As of 10/2014, Dr. Antoine PocheHochrein was following clinically. She has been placed on BiPap at night now for OSA. Since patient's symptoms are clinically stable and no syncope then it is anticipated that she can proceed as planned if no new changes.   Velna Ochsllison Zelenak, PA-C [facility]Anesthesiology Phone [phone] 06/28/2015 4:06 PM

## 2015-06-28 NOTE — Pre-Procedure Instructions (Signed)
Beth [name]  06/28/2015      [facility] - [place] ([place]), [place] - [address] [place] ([place]) [place] [address] Phone: [phone] Fax: [phone]  [facility] [address] [place], [place] - [address] [place] [address] Phone: [phone] Fax: [phone]  [facility] - [place], [place] - [address] [place] [address] Phone: [phone] Fax: [phone]  [facility] - [place], [place] - [address] [place] [address] Phone: [phone] Fax: [phone]    Your procedure is scheduled on Friday, July 01, 2015  Report to [facility] Admitting at 6:30 A.M.  Call this number if you have problems the morning of surgery:  [phone]   Remember:  Do not eat food or drink liquids after midnight Thursday, June 30, 2015  Take these medicines the morning of surgery with A SIP OF WATER : ARIPiprazole (ABILIFY),  DULoxetine (CYMBALTA), gabapentin (NEURONTIN), sertraline (ZOLOFT), verapamil (VERELAN), Fluticasone-Salmeterol (ADVAIR), fluticasone (FLONASE)  nasal spray, if needed:albuterol (VENTOLIN HFA)  Inhaler for wheezing or shortness of breath ( bring inhaler in with you on day of surgery). Stop taking Aspirin, vitamins, fish oil, and herbal medications. Do not take any NSAIDs ie: Ibuprofen, Advil, Naproxen or any medication containing Aspirin; stop now.  How to Manage Your Diabetes Before Surgery Why is it important to control my blood sugar before and after surgery?   Improving blood sugar levels before and after surgery helps healing and can limit problems.  A way of improving blood sugar control is eating a healthy diet by:  - Eating less sugar and carbohydrates  - Increasing activity/exercise  - Talk with your doctor about reaching your blood sugar goals  High blood sugars  (greater than 180 mg/dL) can raise your risk of infections and slow down your recovery so you will need to focus on controlling your diabetes during the weeks before surgery.  Make sure that the doctor who takes care of your diabetes knows about your planned surgery including the date and location.  How do I manage my blood sugars before surgery?   Check your blood sugar at least 4 times a day, 2 days before surgery to make sure that they are not too high or low.   Check your blood sugar the morning of your surgery when you wake up and every 2 hours until you get to the Short-Stay unit.  If your blood sugar is less than 70 mg/dL, you will need to treat for low blood sugar by:  Treat a low blood sugar (less than 70 mg/dL) with 1/2 cup of clear juice (cranberry or apple), 4 glucose tablets, OR glucose gel.  Recheck blood sugar in 15 minutes after treatment (to make sure it is greater than 70 mg/dL).  If blood sugar is not greater than 70 mg/dL on re-check, call [phone] for further instructions.   Report your blood sugar to the Short-Stay nurse when you get to Short-Stay.  References:  [facility], 2007 "How to Manage your Diabetes Before and After Surgery".   Do not wear jewelry, make-up or nail polish.  Do not wear lotions, powders, or perfumes.  You may wear deodorant.  Do not shave 48 hours prior to surgery.  Men may shave face and neck.  Do not bring valuables to the hospital.  [facility] is not responsible  for any belongings or valuables.  Contacts, dentures or bridgework may not be worn into surgery.  Leave your suitcase in the car.  After surgery it may be brought to your room.  For patients admitted to the hospital, discharge time will be determined by your treatment team.  Patients discharged the day of surgery will not be allowed to drive home.   Name and phone number of your driver:   Special instructions: Shower the night before surgery  and the morning of surgery with CHG.  Please read over the following fact sheets that you were given. Pain Booklet, Coughing and Deep Breathing and Surgical Site Infection Prevention

## 2015-06-29 ENCOUNTER — Other Ambulatory Visit: Payer: Self-pay | Admitting: Gastroenterology

## 2015-06-29 LAB — HEMOGLOBIN A1C
Hgb A1c MFr Bld: 6 % — ABNORMAL HIGH (ref 4.8–5.6)
Mean Plasma Glucose: 126 mg/dL

## 2015-06-30 MED ORDER — CEFAZOLIN SODIUM-DEXTROSE 2-3 GM-% IV SOLR: 2.0000 g | INTRAVENOUS | Status: DC

## 2015-06-30 MED ORDER — DEXTROSE 5 % IV SOLN
3.0000 g | INTRAVENOUS | Status: AC
  Administered 2015-07-01: 3 g via INTRAVENOUS
  Filled 2015-06-30: qty 3000

## 2015-06-30 NOTE — Anesthesia Preprocedure Evaluation (Addendum)
Anesthesia Evaluation  Patient identified by MRN, date of birth, ID band Patient awake    Reviewed: Allergy & Precautions, NPO status , Patient's Chart, lab work & pertinent test results  History of Anesthesia Complications Negative for: history of anesthetic complications  Airway Mallampati: III  TM Distance: >3 FB Neck ROM: Full    Dental  (+) Dental Advisory Given, Poor Dentition, Chipped, Missing   Pulmonary sleep apnea and Continuous Positive Airway Pressure Ventilation , COPD,  COPD inhaler, former smoker,    breath sounds clear to auscultation       Cardiovascular hypertension, Pt. on medications (-) angina Rhythm:Regular Rate:Normal  '16 ECHO: EF 60-65%, valves OK   Neuro/Psych    GI/Hepatic Neg liver ROS, GERD  Controlled,  Endo/Other  diabetes (glu 115, diet controlled)Morbid obesity  Renal/GU negative Renal ROS     Musculoskeletal   Abdominal (+) + obese,   Peds  Hematology   Anesthesia Other Findings   Reproductive/Obstetrics                         Anesthesia Physical Anesthesia Plan  ASA: III  Anesthesia Plan: General   Post-op Pain Management:    Induction: Intravenous  Airway Management Planned: Nasal ETT  Additional Equipment:   Intra-op Plan:   Post-operative Plan: Extubation in OR  Informed Consent: I have reviewed the patients History and Physical, chart, labs and discussed the procedure including the risks, benefits and alternatives for the proposed anesthesia with the patient or authorized representative who has indicated his/her understanding and acceptance.   Dental advisory given  Plan Discussed with: CRNA and Surgeon  Anesthesia Plan Comments: (Plan routine monitors, GETA with NT intubation)        Anesthesia Quick Evaluation

## 2015-07-01 ENCOUNTER — Ambulatory Visit (HOSPITAL_COMMUNITY)
Admission: RE | Admit: 2015-07-01 | Discharge: 2015-07-01 | Disposition: A | Discharge status: Home / Self Care | Payer: Medicaid Other | Source: Ambulatory Visit | Attending: Oral Surgery | Admitting: Oral Surgery

## 2015-07-01 ENCOUNTER — Encounter (HOSPITAL_COMMUNITY): Payer: Self-pay | Admitting: *Deleted

## 2015-07-01 ENCOUNTER — Encounter (HOSPITAL_COMMUNITY)
Admission: RE | Disposition: A | Discharge status: Home / Self Care | Payer: Self-pay | Source: Ambulatory Visit | Attending: Oral Surgery

## 2015-07-01 ENCOUNTER — Ambulatory Visit (HOSPITAL_COMMUNITY): Payer: Medicaid Other | Admitting: Vascular Surgery

## 2015-07-01 ENCOUNTER — Ambulatory Visit (HOSPITAL_COMMUNITY): Payer: Medicaid Other | Admitting: Anesthesiology

## 2015-07-01 DIAGNOSIS — Z87891 Personal history of nicotine dependence: Secondary | ICD-10-CM | POA: Diagnosis not present

## 2015-07-01 DIAGNOSIS — K219 Gastro-esophageal reflux disease without esophagitis: Secondary | ICD-10-CM | POA: Insufficient documentation

## 2015-07-01 DIAGNOSIS — Z6841 Body Mass Index (BMI) 40.0 and over, adult: Secondary | ICD-10-CM | POA: Insufficient documentation

## 2015-07-01 DIAGNOSIS — Z79899 Other long term (current) drug therapy: Secondary | ICD-10-CM | POA: Diagnosis not present

## 2015-07-01 DIAGNOSIS — I422 Other hypertrophic cardiomyopathy: Secondary | ICD-10-CM | POA: Diagnosis not present

## 2015-07-01 DIAGNOSIS — M270 Developmental disorders of jaws: Secondary | ICD-10-CM | POA: Insufficient documentation

## 2015-07-01 DIAGNOSIS — K029 Dental caries, unspecified: Secondary | ICD-10-CM | POA: Diagnosis present

## 2015-07-01 DIAGNOSIS — M170 Bilateral primary osteoarthritis of knee: Secondary | ICD-10-CM | POA: Diagnosis not present

## 2015-07-01 DIAGNOSIS — E119 Type 2 diabetes mellitus without complications: Secondary | ICD-10-CM | POA: Diagnosis not present

## 2015-07-01 DIAGNOSIS — J45909 Unspecified asthma, uncomplicated: Secondary | ICD-10-CM | POA: Diagnosis not present

## 2015-07-01 DIAGNOSIS — Z7951 Long term (current) use of inhaled steroids: Secondary | ICD-10-CM | POA: Insufficient documentation

## 2015-07-01 DIAGNOSIS — E785 Hyperlipidemia, unspecified: Secondary | ICD-10-CM | POA: Insufficient documentation

## 2015-07-01 DIAGNOSIS — J449 Chronic obstructive pulmonary disease, unspecified: Secondary | ICD-10-CM | POA: Diagnosis not present

## 2015-07-01 DIAGNOSIS — F329 Major depressive disorder, single episode, unspecified: Secondary | ICD-10-CM | POA: Insufficient documentation

## 2015-07-01 DIAGNOSIS — I10 Essential (primary) hypertension: Secondary | ICD-10-CM | POA: Diagnosis not present

## 2015-07-01 DIAGNOSIS — G4730 Sleep apnea, unspecified: Secondary | ICD-10-CM | POA: Insufficient documentation

## 2015-07-01 HISTORY — PX: MULTIPLE EXTRACTIONS WITH ALVEOLOPLASTY: SHX5342

## 2015-07-01 LAB — GLUCOSE, CAPILLARY
GLUCOSE-CAPILLARY: 115 mg/dL — AB (ref 65–99)
GLUCOSE-CAPILLARY: 127 mg/dL — AB (ref 65–99)

## 2015-07-01 SURGERY — MULTIPLE EXTRACTION WITH ALVEOLOPLASTY
Anesthesia: General | Site: Mouth | Laterality: Bilateral

## 2015-07-01 MED ORDER — SUCCINYLCHOLINE CHLORIDE 20 MG/ML IJ SOLN
INTRAMUSCULAR | Status: AC
  Filled 2015-07-01: qty 1

## 2015-07-01 MED ORDER — LIDOCAINE HCL (CARDIAC) 20 MG/ML IV SOLN
INTRAVENOUS | Status: DC | PRN
  Administered 2015-07-01: 30 mg via INTRAVENOUS

## 2015-07-01 MED ORDER — MIDAZOLAM HCL 5 MG/5ML IJ SOLN
INTRAMUSCULAR | Status: DC | PRN
  Administered 2015-07-01: 2 mg via INTRAVENOUS

## 2015-07-01 MED ORDER — ALBUTEROL SULFATE HFA 108 (90 BASE) MCG/ACT IN AERS
INHALATION_SPRAY | RESPIRATORY_TRACT | Status: DC | PRN
  Administered 2015-07-01 (×4): 2 via RESPIRATORY_TRACT

## 2015-07-01 MED ORDER — PHENYLEPHRINE 40 MCG/ML (10ML) SYRINGE FOR IV PUSH (FOR BLOOD PRESSURE SUPPORT)
PREFILLED_SYRINGE | INTRAVENOUS | Status: AC
  Filled 2015-07-01: qty 10

## 2015-07-01 MED ORDER — EPHEDRINE SULFATE 50 MG/ML IJ SOLN
INTRAMUSCULAR | Status: AC
  Filled 2015-07-01: qty 1

## 2015-07-01 MED ORDER — OXYCODONE-ACETAMINOPHEN 5-325 MG PO TABS
1.0000 | ORAL_TABLET | Freq: Once | ORAL | Status: AC
  Administered 2015-07-01: 1 via ORAL

## 2015-07-01 MED ORDER — PROPOFOL 10 MG/ML IV BOLUS
INTRAVENOUS | Status: DC | PRN
  Administered 2015-07-01: 160 mg via INTRAVENOUS
  Administered 2015-07-01: 40 mg via INTRAVENOUS
  Administered 2015-07-01: 50 mg via INTRAVENOUS

## 2015-07-01 MED ORDER — ONDANSETRON HCL 4 MG/2ML IJ SOLN
INTRAMUSCULAR | Status: DC | PRN
  Administered 2015-07-01: 4 mg via INTRAVENOUS

## 2015-07-01 MED ORDER — LIDOCAINE-EPINEPHRINE 2 %-1:100000 IJ SOLN
INTRAMUSCULAR | Status: AC
  Filled 2015-07-01: qty 1

## 2015-07-01 MED ORDER — MEPERIDINE HCL 25 MG/ML IJ SOLN: 6.2500 mg | INTRAMUSCULAR | Status: DC | PRN

## 2015-07-01 MED ORDER — OXYMETAZOLINE HCL 0.05 % NA SOLN
NASAL | Status: AC
  Filled 2015-07-01: qty 15

## 2015-07-01 MED ORDER — MIDAZOLAM HCL 2 MG/2ML IJ SOLN
INTRAMUSCULAR | Status: AC
  Filled 2015-07-01: qty 2

## 2015-07-01 MED ORDER — SODIUM CHLORIDE 0.9 % IR SOLN
Status: DC | PRN
Start: 2015-07-01 — End: 2015-07-01
  Administered 2015-07-01: 1000 mL

## 2015-07-01 MED ORDER — PROPOFOL 10 MG/ML IV BOLUS
INTRAVENOUS | Status: AC
  Filled 2015-07-01: qty 20

## 2015-07-01 MED ORDER — FENTANYL CITRATE (PF) 250 MCG/5ML IJ SOLN
INTRAMUSCULAR | Status: AC
  Filled 2015-07-01: qty 5

## 2015-07-01 MED ORDER — ROCURONIUM BROMIDE 50 MG/5ML IV SOLN
INTRAVENOUS | Status: AC
  Filled 2015-07-01: qty 1

## 2015-07-01 MED ORDER — HYDROMORPHONE HCL 1 MG/ML IJ SOLN
INTRAMUSCULAR | Status: AC
  Filled 2015-07-01: qty 1

## 2015-07-01 MED ORDER — OXYCODONE-ACETAMINOPHEN 5-325 MG PO TABS: 1.0000 | ORAL_TABLET | ORAL | Status: DC | PRN

## 2015-07-01 MED ORDER — ONDANSETRON HCL 4 MG/2ML IJ SOLN
INTRAMUSCULAR | Status: AC
  Filled 2015-07-01: qty 2

## 2015-07-01 MED ORDER — SUGAMMADEX SODIUM 200 MG/2ML IV SOLN
INTRAVENOUS | Status: DC | PRN
Start: 2015-07-01 — End: 2015-07-01
  Administered 2015-07-01: 500 mg via INTRAVENOUS

## 2015-07-01 MED ORDER — PROMETHAZINE HCL 25 MG/ML IJ SOLN
6.2500 mg | INTRAMUSCULAR | Status: DC | PRN
Start: 2015-07-01 — End: 2015-07-01

## 2015-07-01 MED ORDER — FENTANYL CITRATE (PF) 100 MCG/2ML IJ SOLN
INTRAMUSCULAR | Status: DC | PRN
  Administered 2015-07-01: 150 ug via INTRAVENOUS

## 2015-07-01 MED ORDER — 0.9 % SODIUM CHLORIDE (POUR BTL) OPTIME
TOPICAL | Status: DC | PRN
  Administered 2015-07-01: 1000 mL

## 2015-07-01 MED ORDER — MIDAZOLAM HCL 2 MG/2ML IJ SOLN: 0.5000 mg | Freq: Once | INTRAMUSCULAR | Status: DC | PRN

## 2015-07-01 MED ORDER — OXYCODONE-ACETAMINOPHEN 5-325 MG PO TABS
ORAL_TABLET | ORAL | Status: AC
  Filled 2015-07-01: qty 1

## 2015-07-01 MED ORDER — SODIUM CHLORIDE 0.9 % IJ SOLN
INTRAMUSCULAR | Status: AC
  Filled 2015-07-01: qty 10

## 2015-07-01 MED ORDER — PHENYLEPHRINE HCL 10 MG/ML IJ SOLN
INTRAMUSCULAR | Status: DC | PRN
  Administered 2015-07-01: 80 ug via INTRAVENOUS

## 2015-07-01 MED ORDER — ROCURONIUM BROMIDE 100 MG/10ML IV SOLN
INTRAVENOUS | Status: DC | PRN
  Administered 2015-07-01: 30 mg via INTRAVENOUS

## 2015-07-01 MED ORDER — LACTATED RINGERS IV SOLN
INTRAVENOUS | Status: DC | PRN
  Administered 2015-07-01: 07:00:00 via INTRAVENOUS

## 2015-07-01 MED ORDER — LIDOCAINE-EPINEPHRINE 2 %-1:100000 IJ SOLN
INTRAMUSCULAR | Status: DC | PRN
  Administered 2015-07-01: 17 mL

## 2015-07-01 MED ORDER — ARTIFICIAL TEARS OP OINT
TOPICAL_OINTMENT | OPHTHALMIC | Status: AC
  Filled 2015-07-01: qty 3.5

## 2015-07-01 MED ORDER — SUCCINYLCHOLINE 20MG/ML (10ML) SYRINGE FOR MEDFUSION PUMP - OPTIME
INTRAMUSCULAR | Status: DC | PRN
  Administered 2015-07-01: 160 mg via INTRAVENOUS

## 2015-07-01 MED ORDER — HYDROMORPHONE HCL 1 MG/ML IJ SOLN
0.2500 mg | INTRAMUSCULAR | Status: DC | PRN
  Administered 2015-07-01: 0.5 mg via INTRAVENOUS

## 2015-07-01 MED ORDER — LIDOCAINE HCL (CARDIAC) 20 MG/ML IV SOLN
INTRAVENOUS | Status: AC
  Filled 2015-07-01: qty 5

## 2015-07-01 SURGICAL SUPPLY — 30 items
BUR CROSS CUT FISSURE 1.6 (BURR) ×2 IMPLANT
BUR CROSS CUT FISSURE 1.6MM (BURR) ×1
BUR EGG ELITE 4.0 (BURR) ×2 IMPLANT
BUR EGG ELITE 4.0MM (BURR) ×1
CANISTER SUCTION 2500CC (MISCELLANEOUS) ×3 IMPLANT
COVER SURGICAL LIGHT HANDLE (MISCELLANEOUS) ×3 IMPLANT
CRADLE DONUT ADULT HEAD (MISCELLANEOUS) ×3 IMPLANT
DRAPE U-SHAPE 76X120 STRL (DRAPES) ×3 IMPLANT
FLUID NSS /IRRIG 1000 ML XXX (MISCELLANEOUS) ×3 IMPLANT
GAUZE PACKING FOLDED 2  STR (GAUZE/BANDAGES/DRESSINGS) ×2
GAUZE PACKING FOLDED 2 STR (GAUZE/BANDAGES/DRESSINGS) ×1 IMPLANT
GLOVE BIO SURGEON STRL SZ 6.5 (GLOVE) ×2 IMPLANT
GLOVE BIO SURGEON STRL SZ7.5 (GLOVE) ×3 IMPLANT
GLOVE BIO SURGEONS STRL SZ 6.5 (GLOVE) ×1
GLOVE BIOGEL PI IND STRL 7.0 (GLOVE) ×1 IMPLANT
GLOVE BIOGEL PI INDICATOR 7.0 (GLOVE) ×2
GOWN STRL REUS W/ TWL LRG LVL3 (GOWN DISPOSABLE) ×1 IMPLANT
GOWN STRL REUS W/ TWL XL LVL3 (GOWN DISPOSABLE) ×1 IMPLANT
GOWN STRL REUS W/TWL LRG LVL3 (GOWN DISPOSABLE) ×3
GOWN STRL REUS W/TWL XL LVL3 (GOWN DISPOSABLE) ×2
KIT BASIN OR (CUSTOM PROCEDURE TRAY) ×3 IMPLANT
KIT ROOM TURNOVER OR (KITS) ×3 IMPLANT
NEEDLE 22X1 1/2 (OR ONLY) (NEEDLE) ×6 IMPLANT
NS IRRIG 1000ML POUR BTL (IV SOLUTION) ×3 IMPLANT
PAD ARMBOARD 7.5X6 YLW CONV (MISCELLANEOUS) ×3 IMPLANT
SUT CHROMIC 3 0 PS 2 (SUTURE) ×9 IMPLANT
SYR CONTROL 10ML LL (SYRINGE) ×3 IMPLANT
TRAY ENT MC OR (CUSTOM PROCEDURE TRAY) ×3 IMPLANT
TUBING IRRIGATION (MISCELLANEOUS) ×3 IMPLANT
YANKAUER SUCT BULB TIP NO VENT (SUCTIONS) ×3 IMPLANT

## 2015-07-01 NOTE — Anesthesia Procedure Notes (Signed)
Procedure Name: Intubation Date/Time: 07/01/2015 7:32 AM Performed by: Lanell MatarBAKER, Rashawn Rolon M Pre-anesthesia Checklist: Patient identified, Emergency Drugs available, Suction available, Patient being monitored and Timeout performed Patient Re-evaluated:Patient Re-evaluated prior to inductionOxygen Delivery Method: Circle system utilized Preoxygenation: Pre-oxygenation with 100% oxygen Intubation Type: IV induction Ventilation: Mask ventilation with difficulty and Nasal airway inserted- appropriate to patient size Laryngoscope Size: Mac and 3 Grade View: Grade II Nasal Tubes: Nasal Rae and Nasal prep performed Number of attempts: 2 Placement Confirmation: ETT inserted through vocal cords under direct vision,  positive ETCO2,  CO2 detector and breath sounds checked- equal and bilateral Tube secured with: Tape Dental Injury: Teeth and Oropharynx as per pre-operative assessment  Future Recommendations: Recommend- induction with short-acting agent, and alternative techniques readily available Comments: Bilateral nares sprayed with Afrin x3 prior to induction. After induction, right nare dilated with increasing sizes of nasal trumpets. 2 hand mask with nasal airway. DL with Hyacinth MeekerMiller 2, grade 3 view. DL with Mac3 by Dr. Jean RosenthalJackson. Grade 2 view. Cricoid pressure. ETT advanced through the cords without difficulty. ETCO2+, BBS=. Tube secured to forehead.

## 2015-07-01 NOTE — Progress Notes (Signed)
Report given to robin roberts rn as caregiver 

## 2015-07-01 NOTE — Transfer of Care (Signed)
Immediate Anesthesia Transfer of Care Note  Patient: Beth [name]  Procedure(s) Performed: Procedure(s): BILATERAL MULTIPLE EXTRACTIONS WITH ALVEOLOPLASTY (Bilateral)  Patient Location: PACU  Anesthesia Type:General  Level of Consciousness: awake, alert , oriented and sedated  Airway & Oxygen Therapy: Patient Spontanous Breathing and Patient connected to face mask oxygen  Post-op Assessment: Report given to RN, Post -op Vital signs reviewed and stable and Patient moving all extremities X 4  Post vital signs: Reviewed and stable  Last Vitals:  Filed Vitals:   07/01/15 0551  BP: 135/71  Pulse: 84  Temp: 36.7 C  Resp: 20    Complications: No apparent anesthesia complications

## 2015-07-01 NOTE — Op Note (Signed)
07/01/2015  8:18 AM  PATIENT:  Yehuda BuddPamela M Mealey  50 y.o. female  PRE-OPERATIVE DIAGNOSIS:  NON RESTORABLE TEETH #2, 3, 4, 6, 7, 8, 9, 11, 15, Bilateral mandibular lingual tori  POST-OPERATIVE DIAGNOSIS:  SAME  PROCEDURE:  Procedure(s):  MULTIPLE EXTRACTIONS TEETH #2, 3, 4, 6, 7, 8, 9, 11, 15 WITH ALVEOLOPLASTY, REMOVAL BILATERAL MANDIBULAR LINGUAL TORI  SURGEON:  Surgeon(s): Ocie DoyneScott Lilianah Buffin, DDS  ANESTHESIA:   local and general  EBL:  minimal  DRAINS: none   SPECIMEN:  No Specimen  COUNTS:  YES  PLAN OF CARE: Discharge to home after PACU  PATIENT DISPOSITION:  PACU - hemodynamically stable.   PROCEDURE DETAILS: Dictation # [id number]  Georgia LopesScott M. Kavita Bartl, DMD 07/01/2015 8:18 AM

## 2015-07-01 NOTE — Anesthesia Postprocedure Evaluation (Signed)
Anesthesia Post Note  Patient: Beth [name]  Procedure(s) Performed: Procedure(s) (LRB): BILATERAL MULTIPLE EXTRACTIONS WITH ALVEOLOPLASTY (Bilateral)  Patient location during evaluation: PACU Anesthesia Type: General Level of consciousness: awake and alert, oriented and patient cooperative Pain management: pain level controlled Vital Signs Assessment: post-procedure vital signs reviewed and stable Respiratory status: spontaneous breathing, nonlabored ventilation and respiratory function stable Cardiovascular status: blood pressure returned to baseline and stable Postop Assessment: no signs of nausea or vomiting Anesthetic complications: no Comments: RN discussed Sugammadex and contraceptive prophylaxis with patient    Last Vitals:  Filed Vitals:   07/01/15 1115 07/01/15 1130  BP: 157/85 159/96  Pulse: 82 88  Temp:    Resp:  18    Last Pain:  Filed Vitals:   07/01/15 1133  PainSc: 7                  Baker Kogler,E. [place] Ponder

## 2015-07-01 NOTE — H&P (Signed)
H&P documentation  -History and Physical Reviewed  -Patient has been re-examined  -No change in the plan of care  Beth [name] M  

## 2015-07-02 NOTE — Op Note (Signed)
NAMCherlyn [name]:  [name], Beth               ACCOUNT NO.:  [id number]  MEDICAL RECORD NO.:  [id number]  LOCATION:  [facility]                         FACILITY:  [facility]  PHYSICIAN:  Georgia LopesScott M. Robbi Scurlock, M.D.  DATE OF BIRTH:  1965/07/31  DATE OF PROCEDURE:  07/01/2015 DATE OF DISCHARGE:  07/01/2015                              OPERATIVE REPORT   PREOPERATIVE DIAGNOSES:  Nonrestorable teeth #2, 3, 4, 6, 7, 8, 9, 11, and 15 secondary to dental caries.  Bilateral mandibular lingual tori. Morbid obesity.  POSTOPERATIVE DIAGNOSES:  Nonrestorable teeth #2, 3, 4, 6, 7, 8, 9, 11, and 15 secondary to dental caries.  Bilateral mandibular lingual tori. Morbid obesity.  PROCEDURES:  Extraction of teeth #2, 3, 4, 6, 7, 8, 9, 11, 15 and alveoplasty of right and left maxilla, removal of bilateral mandibular lingual tori.  SURGEON:  Georgia LopesScott M. Elisea Khader, M.D.  ANESTHESIA:  General, Dr. Jean RosenthalJackson attending.  DESCRIPTION OF PROCEDURE:  The patient was taken to the operating room and placed on the table in supine position.  General anesthesia was administered intravenously and a nasal endotracheal tube was placed and secured.  The eyes were protected and the patient was draped for the procedure.  Time-out was performed.  Then, the posterior pharynx was suctioned with Yankauer suction and the throat pack was placed.  A 2% lidocaine with 1:100,000 epinephrine was infiltrated in an inferior alveolar block on the right and left side and a buccal and palatal infiltration in the right and left maxilla around the teeth to be removed, total of 17 mL was utilized.  A bite block was placed in the right side of the mouth and sweetheart retractor was used to retract the tongue.  A #15 blade was to make an incision beginning at tooth #15, carrying forward along the alveolar crest at tooth #11 and then to tooth #9, incision encircled the teeth both buccally and palatally.  The periosteum was reflected with the periosteal elevator.  The  teeth were elevated with a 301 elevator and removed from the mouth with the dental forceps.  Teeth #8 and 7 were also extracted in similar fashion.  Then, the alveolar crest was exposed using the periosteal elevator.  An alveoplasty was done on the left maxilla using the egg-shaped bur and bone file.  Then, the area was irrigated and closed with 3-0 chromic. The left mandible was then operated.  Next, a 15 blade was used to make an incision along the crest of edentulous posterior ridge, carried forward to the distal most tooth and then carried lingually until approximately tooth #23 was encountered on the lingual aspect.  The periosteum was reflected carefully to expose the mandibular torus and then, a Seldin retractor was used to protect the lingual tissues and then, the lingual torus was removed using the egg-shaped bur and bone file.  The area was then irrigated and closed with 3-0 chromic.  The bite block and sweetheart retractor were then repositioned to the other side of the mouth and a 15-blade was used to make incision around teeth #2, 3, 4, carried forward to tooth #6, both buccally and palatally.  The periosteum was reflected.  The teeth were elevated  with a 301 elevator and removed from the mouth with dental forceps.  The sockets were curetted.  The periosteum was further reflected with a periosteal elevator to expose the alveolar crest and then, the alveoplasty was performed using the egg-shaped bur and bone file.  Then, the areas were irrigated and closed with 3-0 chromic.  In the mandible, the 15-blade was used to make an incision on the crest of the mandibular edentulous posterior ridge in the area of approximately tooth #31, carried forward to the distal most tooth and then the incision was carried lingually until tooth #26 was reached.  The periosteum was carefully reflected to expose the lingual torus and then the torus was removed using the Seldin retractor, the  egg-shaped bur and the bone file.  Then, the area was irrigated and closed with 3-0 chromic.  The oral cavity was inspected, found to have good contour, hemostasis and closure.  The oral cavity was irrigated, suctioned and throat pack was removed.  The patient was awakened, taken to the recovery room, breathing spontaneously in good condition.  ESTIMATED BLOOD LOSS:  Minimum.  COMPLICATIONS:  None.  SPECIMENS:  None.     Georgia Lopes, M.D.     SMJ/MEDQ  D:  07/01/2015  T:  07/02/2015  Job:  [id number]

## 2015-07-04 ENCOUNTER — Encounter (HOSPITAL_COMMUNITY): Payer: Self-pay | Admitting: Oral Surgery

## 2015-07-06 ENCOUNTER — Telehealth: Payer: Self-pay | Admitting: *Deleted

## 2015-07-13 ENCOUNTER — Ambulatory Visit: Payer: Medicaid Other | Attending: Internal Medicine | Admitting: Internal Medicine

## 2015-07-13 ENCOUNTER — Other Ambulatory Visit (HOSPITAL_COMMUNITY)
Admission: RE | Admit: 2015-07-13 | Discharge: 2015-07-13 | Disposition: A | Discharge status: Home / Self Care | Payer: Medicaid Other | Source: Ambulatory Visit | Attending: Internal Medicine | Admitting: Internal Medicine

## 2015-07-13 ENCOUNTER — Encounter: Payer: Self-pay | Admitting: Internal Medicine

## 2015-07-13 ENCOUNTER — Other Ambulatory Visit: Payer: Self-pay | Admitting: Internal Medicine

## 2015-07-13 VITALS — BP 149/91 | HR 92 | Temp 98.0°F | Resp 16 | Ht 68.0 in | Wt 305.0 lb

## 2015-07-13 DIAGNOSIS — Z01411 Encounter for gynecological examination (general) (routine) with abnormal findings: Secondary | ICD-10-CM | POA: Diagnosis present

## 2015-07-13 DIAGNOSIS — L298 Other pruritus: Secondary | ICD-10-CM | POA: Diagnosis not present

## 2015-07-13 DIAGNOSIS — R102 Pelvic and perineal pain: Secondary | ICD-10-CM | POA: Insufficient documentation

## 2015-07-13 DIAGNOSIS — N898 Other specified noninflammatory disorders of vagina: Secondary | ICD-10-CM | POA: Diagnosis not present

## 2015-07-13 DIAGNOSIS — L304 Erythema intertrigo: Secondary | ICD-10-CM | POA: Diagnosis not present

## 2015-07-13 DIAGNOSIS — Z113 Encounter for screening for infections with a predominantly sexual mode of transmission: Secondary | ICD-10-CM | POA: Diagnosis present

## 2015-07-13 DIAGNOSIS — N760 Acute vaginitis: Secondary | ICD-10-CM | POA: Insufficient documentation

## 2015-07-13 MED ORDER — FLUCONAZOLE 150 MG PO TABS: 150.0000 mg | ORAL_TABLET | Freq: Once | ORAL | Status: DC

## 2015-07-13 MED ORDER — NYSTATIN 100000 UNIT/GM EX CREA: 1.0000 "application " | TOPICAL_CREAM | Freq: Two times a day (BID) | CUTANEOUS | Status: AC

## 2015-07-13 MED FILL — ARIPiprazole 20 MG TABS: 20 | 30 days supply | Qty: 30 | Fill #0

## 2015-07-13 NOTE — Progress Notes (Signed)
Patient complains of having some vaginal itching for almost One week Denies any discharge or odor

## 2015-07-13 NOTE — Progress Notes (Signed)
   Subjective:    Patient ID: Beth [name], female    DOB: 13-Jul-1965, 50 y.o.   MRN: [id number]  Vaginal Itching The patient's primary symptoms include genital itching and a genital rash. The patient's pertinent negatives include no genital lesions, pelvic pain, vaginal bleeding or vaginal discharge. This is a new problem. The current episode started in the past 7 days. The problem occurs constantly. The problem has been gradually worsening. The patient is experiencing no pain. She is not pregnant. Pertinent negatives include no dysuria, flank pain, hematuria or painful intercourse.    Review of Systems  Genitourinary: Negative for dysuria, hematuria, flank pain, vaginal discharge and pelvic pain.  All other systems reviewed and are negative.      Objective:   Physical Exam  Genitourinary: Uterus normal. There is rash on the right labia. There is rash on the left labia. Cervix exhibits no motion tenderness, no discharge and no friability. Right adnexum displays no tenderness. Left adnexum displays no tenderness. Vaginal discharge found.  Intertrigo in skin folds and groin  Lymphadenopathy:       Right: No inguinal adenopathy present.       Left: No inguinal adenopathy present.      Assessment & Plan:  Beth [name] was seen today for vaginal itching.  Diagnoses and all orders for this visit:  Vaginal itching -     Cytology - PAP [place] -     fluconazole (DIFLUCAN) 150 MG tablet; Take 1 tablet (150 mg total) by mouth once. May repeat in 3 days  Intertrigo -     nystatin cream (MYCOSTATIN); Apply 1 application topically 2 (two) times daily.  Return if symptoms worsen or fail to improve.   Ambrose Finland, NP 07/13/2015 6:52 PM

## 2015-07-13 NOTE — Patient Instructions (Signed)
Do not take Atorvastatin on the same day as Atorvastatin

## 2015-07-14 MED FILL — FLUCONAZOLE 150 MG TABLET: 150 | 2 days supply | Qty: 2 | Fill #0

## 2015-07-14 MED FILL — NYSTATIN 100,000 UNIT/GM CR: 100000 | 15 days supply | Qty: 30 | Fill #0

## 2015-07-15 LAB — CERVICOVAGINAL ANCILLARY ONLY
CHLAMYDIA, DNA PROBE: NEGATIVE
NEISSERIA GONORRHEA: NEGATIVE
Trichomonas: NEGATIVE
Wet Prep (BD Affirm): NEGATIVE

## 2015-07-18 ENCOUNTER — Telehealth: Payer: Self-pay

## 2015-07-18 LAB — CYTOLOGY - PAP

## 2015-07-18 NOTE — Telephone Encounter (Signed)
-----   Message from Ambrose Finland, NP sent at 07/15/2015  5:57 PM EST ----- No STD's

## 2015-07-18 NOTE — Telephone Encounter (Signed)
Spoke with patient this am and she is aware her results Were negative for any STD's

## 2015-07-20 NOTE — Telephone Encounter (Signed)
Spoke with patient and she is aware of her normal pap results 

## 2015-07-20 NOTE — Telephone Encounter (Signed)
-----   Message from Ambrose Finland, NP sent at 07/20/2015  5:22 PM EST ----- Normal cytology. Repeat pap in 3 years

## 2015-08-09 ENCOUNTER — Encounter: Payer: Self-pay | Admitting: Sports Medicine

## 2015-08-09 ENCOUNTER — Ambulatory Visit (INDEPENDENT_AMBULATORY_CARE_PROVIDER_SITE_OTHER): Payer: Medicaid Other | Admitting: Sports Medicine

## 2015-08-09 VITALS — BP 156/91 | HR 85 | Ht 68.0 in | Wt 305.0 lb

## 2015-08-09 DIAGNOSIS — M1711 Unilateral primary osteoarthritis, right knee: Secondary | ICD-10-CM

## 2015-08-09 DIAGNOSIS — M25562 Pain in left knee: Secondary | ICD-10-CM | POA: Diagnosis not present

## 2015-08-09 DIAGNOSIS — M25561 Pain in right knee: Secondary | ICD-10-CM | POA: Diagnosis present

## 2015-08-09 DIAGNOSIS — M1712 Unilateral primary osteoarthritis, left knee: Secondary | ICD-10-CM

## 2015-08-09 MED ORDER — METHYLPREDNISOLONE ACETATE 40 MG/ML IJ SUSP
40.0000 mg | Freq: Once | INTRAMUSCULAR | Status: AC
  Administered 2015-08-09: 40 mg via INTRA_ARTICULAR

## 2015-08-09 MED ORDER — METHYLPREDNISOLONE ACETATE 40 MG/ML IJ SUSP
40.0000 mg | Freq: Once | INTRAMUSCULAR | Status: AC
Start: 2015-08-09 — End: 2015-08-09
  Administered 2015-08-09: 40 mg via INTRA_ARTICULAR

## 2015-08-09 NOTE — Progress Notes (Signed)
Beth [name] - 50 y.o. female MRN [id number]  Date of birth: November 27, 1965  SUBJECTIVE:  Including CC & ROS.  No chief complaint on file. CC: bilateral knee pain  HPI: Patient presents today with worsening bilateral knee pain for approx 1 month. Last seen on 04/19/15 and received steroid injections in both knees. Immediately after the injection she had a steroid flare which resolved in 2 days. Her pain significantly improved for ~2.5 months and has gradually worsened over the past month. Pain is located in the medial and lateral aspect of her knees bilaterally, R = L. Currently, her pain is an 8/10. Injections and Mobic have helped her pain in the past. Walking and standing make her pain worse. At times she feels unstable and that her knee might collapse. Walks with a cane. Last fall >2 years ago.     HISTORY: Past Medical, Surgical, Social, and Family History Reviewed & Updated per EMR.   Pertinent Historical Findings include: No prior knee surgeries. Spent significant amount of time walking and standing on concrete floors when she was younger. Complicated PMH including morbid obesity, hypertrophic cardiomyopathy, HTN, DM2, and OSA  DATA REVIEWED: Knee x-rays from 09/01/2012  PHYSICAL EXAM:  VS: BP:(!) 156/91 mmHg  HR:85bpm  TEMP: ( )  RESP:   HT:5\' 8"  (172.7 cm)   WT:(!) 305 lb (138.347 kg)  BMI:46.5 PHYSICAL EXAM: Gen: well-appearing obese African American female in NAD HEENT: normocephalic, atraumatic, EOMI Pulm: non-labored respirations  R knee: No joint effusion or erythema. Hypopigmented skin changes along the shin No TTP along medial or lateral joint lines, tibial tuberosity, patella, quadriceps tendon, patellar tendon, or femoral condyles 1+ crepitance with flexion. Full ROM with flexion and extension No increased laxity with varus and valgus stress 5/5 quadriceps and hamstrings strength Negative anterior drawer and posterior drawer. Neurovascularly intact.  L knee: No  joint effusion or erythema. Hypopigmented skin changes along the shin No TTP along medial or lateral joint lines, tibial tuberosity, patella, quadriceps tendon, patellar tendon, or femoral condyles Full ROM with flexion and extension No increased laxity with varus and valgus stress 5/5 quadriceps and hamstrings strength Negative anterior drawer and posterior drawer. Neurovascularly intact.   ASSESSMENT & PLAN: See problem based charting & AVS for pt instructions. 1.) Bilateral knee osteoarthritis - Patient with significant degenerative OA of her knees bilaterally. Last injection on 04/19/15 with around 2-3 months of symptomatic relief. Discussed with patient about the diminishing returns of repeat steroid injections and that the only cure for her symptoms would be with knee replacement. Given current BMI and medical comorbidities, unsure if patient would be a surgical candidate. Agreed that we would proceed with injections today and that she would consider speaking with a surgeon in the future if her knee pain persists despite injections. Injected R knee with anterior-medial approach and L knee with anterior-lateral approach. - Will get updated standing X-rays of both knees - Will follow up PRN, ideally >6 months for repeat injection - Continue Mobic and use cane while walking for additional stability  Knee Injections: Consent obtained and verified. Time-out conducted. Noted no overlying erythema, induration, or other signs of local infection. Skin prepped in a sterile fashion. Topical analgesic spray: Ethyl chloride. Joint: RIGHT KNEE Needle: 25 gauge, 1.5 inch needle Completed without difficulty. Meds: 1 mL Depo-Medrol ( ) and 3mL 1% Xylocaine  Consent obtained and verified. Time-out conducted. Noted no overlying erythema, induration, or other signs of local infection. Skin prepped in a sterile fashion. Topical analgesic  spray: Ethyl chloride. Joint: LEFT KNEE Needle: 25 gauge ,  1.5 inch needle Completed without difficulty. Meds: 1 mL Depo-Medrol ( ) and 3mL 1% Xylocaine   Advised to call if fevers/chills, erythema, induration, drainage, or persistent bleeding.

## 2015-08-16 ENCOUNTER — Ambulatory Visit (HOSPITAL_COMMUNITY)
Admission: RE | Admit: 2015-08-16 | Discharge: 2015-08-16 | Disposition: A | Discharge status: Home / Self Care | Payer: Medicaid Other | Source: Ambulatory Visit | Attending: Sports Medicine | Admitting: Sports Medicine

## 2015-08-16 DIAGNOSIS — M25569 Pain in unspecified knee: Secondary | ICD-10-CM | POA: Diagnosis present

## 2015-08-16 DIAGNOSIS — L942 Calcinosis cutis: Secondary | ICD-10-CM | POA: Diagnosis not present

## 2015-08-16 DIAGNOSIS — M170 Bilateral primary osteoarthritis of knee: Secondary | ICD-10-CM | POA: Diagnosis not present

## 2015-08-16 DIAGNOSIS — M25461 Effusion, right knee: Secondary | ICD-10-CM | POA: Diagnosis not present

## 2015-08-16 DIAGNOSIS — M25562 Pain in left knee: Secondary | ICD-10-CM | POA: Diagnosis not present

## 2015-08-16 DIAGNOSIS — M25561 Pain in right knee: Secondary | ICD-10-CM | POA: Insufficient documentation

## 2015-08-17 ENCOUNTER — Telehealth: Payer: Self-pay | Admitting: Sports Medicine

## 2015-08-17 NOTE — Telephone Encounter (Signed)
Patient notified of x-ray results. X-rays of both knees show severe osteoarthritic changes particularly in the medial and patellofemoral joints. Definitive treatment is total knee arthroplasty. We can repeat cortisone injections approximately every 6 months as needed. She will follow-up prn.

## 2015-08-30 ENCOUNTER — Ambulatory Visit (INDEPENDENT_AMBULATORY_CARE_PROVIDER_SITE_OTHER): Payer: Medicaid Other | Admitting: Cardiovascular Disease

## 2015-08-30 ENCOUNTER — Encounter: Payer: Self-pay | Admitting: Cardiovascular Disease

## 2015-08-30 VITALS — BP 167/93 | HR 82 | Ht 68.0 in | Wt 304.7 lb

## 2015-08-30 DIAGNOSIS — E669 Obesity, unspecified: Secondary | ICD-10-CM

## 2015-08-30 DIAGNOSIS — G4733 Obstructive sleep apnea (adult) (pediatric): Secondary | ICD-10-CM

## 2015-08-30 DIAGNOSIS — Z9989 Dependence on other enabling machines and devices: Secondary | ICD-10-CM

## 2015-08-30 DIAGNOSIS — E1140 Type 2 diabetes mellitus with diabetic neuropathy, unspecified: Secondary | ICD-10-CM

## 2015-08-30 DIAGNOSIS — I10 Essential (primary) hypertension: Secondary | ICD-10-CM

## 2015-08-30 DIAGNOSIS — G629 Polyneuropathy, unspecified: Secondary | ICD-10-CM | POA: Diagnosis not present

## 2015-08-30 NOTE — Patient Instructions (Signed)
Your physician wants you to follow-up in: 1 year or sooner if needed for sleep. You will receive a reminder letter in the mail two months in advance. If you don't receive a letter, please call our office to schedule the follow-up appointment.  

## 2015-09-04 ENCOUNTER — Encounter: Payer: Self-pay | Admitting: Cardiovascular Disease

## 2015-09-04 DIAGNOSIS — G4733 Obstructive sleep apnea (adult) (pediatric): Secondary | ICD-10-CM | POA: Insufficient documentation

## 2015-09-04 DIAGNOSIS — E1140 Type 2 diabetes mellitus with diabetic neuropathy, unspecified: Secondary | ICD-10-CM | POA: Insufficient documentation

## 2015-09-04 NOTE — Progress Notes (Signed)
Patient ID: Beth [name], female   DOB: Jul 23, 1965, 50 y.o.   MRN: [id number]     HPI: Beth [name], is a 49 y.o. female who presents to sleep clinic for evaluation of her severe obstructive sleep apnea following initiation of CPAP therapy.  [name] is a 50 year old female who has a history of morbid obesity, diabetes mellitus with peripheral neuropathy, hypertension, hyperlipidemia, COPD and CHF.  She initially was referred for sleep study on 01/18/2015 by Dr. Percival Spanish which was interpreted by Dr. Radford Pax.  This revealed severe obstructive sleep apnea with an AHI of 60.6 per hour.  The patient was initially tried on CPAP therapy, but her AHI at a CPAP pressure of 18 continue to be elevated at 27.7 per hour.  There was severe oxygen desaturation to a nadir of 75% and 19 minutes were spent with oxygen saturation less than 88%.  There was evidence for moderate snoring.  There were PVCs.  Subsequently she was referred for a BiPAP titration, which was done in 04/20/2015.  On this study.  Her oxygen desaturated to 84%.  She was ultimately titrated to a BiPAP pressure of 18/14 cm of water.  She was started on BiPAP therapy in November.  Delay.  She goes to bed at 11 PM and wakes up approximately 9:30 -10:00 am.  She feels that she is sleeping much better.  Her sleep is restorative.  She is unaware of any breakthrough snoring.  She denies residual daytime sleepiness.  She is unaware of palpitations.   Epworth Sleepiness Scale: Situation   Chance of Dozing/Sleeping (0 = never , 1 = slight chance , 2 = moderate chance , 3 = high chance )   sitting and reading 3   watching TV 3   sitting inactive in a public place 0   being a passenger in a motor vehicle for an hour or more 1   lying down in the afternoon 2   sitting and talking to someone 0   sitting quietly after lunch (no alcohol) 0   while stopped for a few minutes in traffic as the driver 0   Total Score  9    Past Medical History  Diagnosis  Date  . Hypertension   . Asthma   . Hyperlipidemia   . Phlebitis   . Asthmatic bronchitis , chronic ([place])   . Depression   . Anxiety   . Type II diabetes mellitus ([place])   . History of blood transfusion     "related to nose would not stop bleeding"  . GERD (gastroesophageal reflux disease)   . Arthritis     "knees" (12/14/2013)  . Hypertrophic cardiomyopathy ([place])   . Vitiligo   . Shortness of breath dyspnea     with exertion  . Sleep apnea     wears CPAP  . Obesity   . Dental caries     Past Surgical History  Procedure Laterality Date  . Umbilical hernia repair  05/2004  . Cholecystectomy    . Dilation and curettage of uterus    . Hysteroscopy w/d&c N/A 08/04/2013    Procedure: DILATATION AND CURETTAGE /HYSTEROSCOPY;  Surgeon: Woodroe Mode, MD;  Location: [facility];  Service: Gynecology;  Laterality: N/A;  . Multiple extractions with alveoloplasty Bilateral 07/01/2015    Procedure: BILATERAL MULTIPLE EXTRACTIONS WITH ALVEOLOPLASTY;  Surgeon: Diona Browner, DDS;  Location: [facility];  Service: Oral Surgery;  Laterality: Bilateral;    No Known Allergies  Current Outpatient Prescriptions  Medication Sig Dispense Refill  . albuterol (VENTOLIN HFA) 108 (90 BASE) MCG/ACT inhaler INHALE 2 PUFFS INTO THE LUNGS EVERY 4 HOURS AS NEEDED FOR WHEEZING OR SHORTNESS OF BREATH. (MAP) 54 each PRN  . ARIPiprazole (ABILIFY) 20 MG tablet Take 1 tablet (20 mg total) by mouth daily. 90 tablet 3  . atorvastatin (LIPITOR) 20 MG tablet Take 1 tablet (20 mg total) by mouth daily. 30 tablet 3  . Blood Glucose Monitoring Suppl (ACCU-CHEK AVIVA PLUS) W/DEVICE KIT USE AS INSTRUCTED 1 kit 0  . DULoxetine (CYMBALTA) 60 MG capsule Take 1 capsule (60 mg total) by mouth daily. 90 capsule 3  . fluticasone (FLONASE) 50 MCG/ACT nasal spray Place 1 spray into both nostrils daily. 16 g 6  . Fluticasone-Salmeterol (ADVAIR) 250-50 MCG/DOSE AEPB Inhale 1 puff into the lungs every 12 (twelve) hours. For shortness of breath 3  each 3  . gabapentin (NEURONTIN) 600 MG tablet Take 1 tablet (600 mg total) by mouth 3 (three) times daily. 90 tablet 5  . glucose blood (ACCU-CHEK AVIVA PLUS) test strip Use as instructed 100 each 12  . hydrOXYzine (ATARAX/VISTARIL) 25 MG tablet   2  . Lancets (ACCU-CHEK SOFT TOUCH) lancets Use as instructed 100 each 12  . meloxicam (MOBIC) 15 MG tablet Take one tablet daily as needed 60 tablet 1  . nystatin cream (MYCOSTATIN) Apply 1 application topically 2 (two) times daily. 30 g 1  . sertraline (ZOLOFT) 100 MG tablet Take 1 tablet (100 mg total) by mouth daily. 30 tablet 2  . verapamil (VERELAN PM) 180 MG 24 hr capsule Take 180 mg by mouth daily with breakfast.    . [DISCONTINUED] buPROPion (WELLBUTRIN XL) 150 MG 24 hr tablet Take 150 mg by mouth daily.      . [DISCONTINUED] metoprolol succinate (TOPROL-XL) 100 MG 24 hr tablet Take 1 tablet (100 mg total) by mouth daily. 30 tablet 2  . [DISCONTINUED] pravastatin (PRAVACHOL) 40 MG tablet Take 40 mg by mouth daily.      . [DISCONTINUED] risperiDONE (RISPERDAL) 2 MG tablet Take 2 mg by mouth at bedtime.       No current facility-administered medications for this visit.    Social History   Social History  . Marital Status: Divorced    Spouse Name: N/A  . Number of Children: 0  . Years of Education: N/A   Occupational History  . Not on file.   Social History Main Topics  . Smoking status: Former Smoker -- 0.25 packs/day for 19 years    Types: Cigarettes  . Smokeless tobacco: Never Used     Comment: Quit smoking cigarettes years ago 06/28/15  . Alcohol Use: No     Comment: "quit alcohol ~ 2005; only then drank occasionl wine cooler"  . Drug Use: No     Comment: "LAST DRUG USE WAS IN 2005"  . Sexual Activity: Not Currently     Comment: "quit smoking cigarettes ~2005"   Other Topics Concern  . Not on file   Social History Narrative   Lives with sister and her children.      Family History  Problem Relation Age of Onset  .  Depression Mother   . Hypertension Mother   . Diabetes Mother   . Hypertension Father   . Hyperlipidemia Father   . Depression Sister     Additional family history is notable that her mother is 72 and father is 84.  She has 2 sisters ROS General: Negative; No fevers, chills,  or night sweats HEENT: Negative; No changes in vision or hearing, sinus congestion, difficulty swallowing Pulmonary: Negative; No cough, wheezing, shortness of breath, hemoptysis Cardiovascular: Negative; No chest pain, presyncope, syncope, palpatations GI: Negative; No nausea, vomiting, diarrhea, or abdominal pain GU: Negative; No dysuria, hematuria, or difficulty voiding Musculoskeletal: Negative; no myalgias, joint pain, or weakness Hematologic: Negative; no easy bruising, bleeding Endocrine: Negative; no heat/cold intolerance Neuro: Negative; no changes in balance, headaches Skin: Negative; No rashes or skin lesions Psychiatric: Negative; No behavioral problems, depression Sleep: See history of present illness.  Sleep much improved since initiation of BiPAP.; No daytime sleepiness, hypersomnolence, bruxism, restless legs, hypnogognic hallucinations, no cataplexy   Physical Exam BP 167/93 mmHg  Pulse 82  Ht 5' 8"  (1.727 m)  Wt 304 lb 11.2 oz (138.211 kg)  BMI 46.34 kg/m2  LMP 03/27/2014   Repeat blood pressure 146/86  Wt Readings from Last 3 Encounters:  08/30/15 304 lb 11.2 oz (138.211 kg)  08/09/15 305 lb (138.347 kg)  07/13/15 305 lb (138.347 kg)   General: Alert, oriented, no distress.  Skin: normal turgor, no rashes HEENT: Normocephalic, atraumatic. Pupils round and reactive; sclera anicteric; extraocular muscles intact; Fundi arteriolar narrowing without hemorrhages or exudates Nose without nasal septal hypertrophy Mouth/Parynx benign; Mallinpatti scale 4 Neck: No JVD, no carotid briuts Lungs: clear to ausculatation and percussion; no wheezing or rales  Chest wall: No tenderness to  palpation Heart: RRR, s1 s2 normal; 1/6 systolic murmur.  No S3 gallop.  No diastolic murmur rubs thrills or heaves. Abdomen: Significant central adiposity; soft, nontender; no hepatosplenomehaly, BS+; abdominal aorta nontender and not dilated by palpation. Back: No CVA tenderness Pulses 2+ Extremities: no clubbinbg cyanosis or edema, Homan's sign negative  Neurologic: grossly nonfocal; cranial nerves intact. Psychological: Normal affect and mood.  No new ECG done today  Previous April 2016 ECG (independently read by me): Sinus rhythm at 90.  LVH.  PVC.  LABS:  BMP Latest Ref Rng 06/28/2015 09/28/2014 09/27/2014  Glucose 65 - 99 mg/dL 99 134(H) 168(H)  BUN 6 - 20 mg/dL 6 19 17   Creatinine 0.44 - 1.00 mg/dL 0.66 0.84 0.82  Sodium 135 - 145 mmol/L 141 138 139  Potassium 3.5 - 5.1 mmol/L 3.6 3.9 4.3  Chloride 101 - 111 mmol/L 107 102 104  CO2 22 - 32 mmol/L 25 31 26   Calcium 8.9 - 10.3 mg/dL 9.1 8.5 9.1     Hepatic Function Latest Ref Rng 09/28/2014 09/27/2014 09/26/2014  Total Protein 6.0 - 8.3 g/dL 7.5 7.6 7.8  Albumin 3.5 - 5.2 g/dL 3.2(L) 3.1(L) 3.1(L)  AST 0 - 37 U/L 31 30 35  ALT 0 - 35 U/L 33 35 41(H)  Alk Phosphatase 39 - 117 U/L 61 67 68  Total Bilirubin 0.3 - 1.2 mg/dL 0.6 0.5 0.5     CBC Latest Ref Rng 06/28/2015 10/12/2014 10/04/2014  WBC 4.0 - 10.5 K/uL 12.6(H) 12.4(H) 18.2(H)  Hemoglobin 12.0 - 15.0 g/dL 13.3 14.1 13.5  Hematocrit 36.0 - 46.0 % 41.6 41.7 40.7  Platelets 150 - 400 K/uL 305 327 416(H)     Lipid Panel     Component Value Date/Time   CHOL 253* 09/24/2014 2257   TRIG 170* 09/24/2014 2257   HDL 28* 09/24/2014 2257   CHOLHDL 9.0 09/24/2014 2257   VLDL 34 09/24/2014 2257   LDLCALC 191* 09/24/2014 2257   LDLDIRECT 134* 12/05/2012 1118     RADIOLOGY: Dg Knee Ap/lat W/sunrise Left  08/16/2015  CLINICAL DATA:  Chronic  bilateral knee pain. Difficulty bending knee. EXAM: LEFT KNEE 3 VIEWS COMPARISON:  Multiple exams, including 09/01/2012 FINDINGS: Severe  osteoarthritis with tricompartmental spurring and markedly severe narrowing of the medial compartment and patellofemoral joint. Small knee effusion. Speckled calcification along the proximal medial calf. IMPRESSION: 1. Stable speckled calcification along the proximal medial calf, likely dystrophic, possibly from venous insufficiency, remote infection, autoimmune disease, or chronic renal disease. 2. Severe osteoarthritis of the left knee. 3. Small knee effusion. Electronically Signed   By: Van Clines M.D.   On: 08/16/2015 13:38   Dg Knee Ap/lat W/sunrise Right  08/16/2015  CLINICAL DATA:  Bilateral knee pain, chronic EXAM: RIGHT KNEE 3 VIEWS COMPARISON:  10/20/2012 FINDINGS: Knee osteoarthritis with advanced medial compartment narrowing and diffuse bulky spurring. Probable suprapatellar joint effusion. No acute fracture, malalignment, or focal bone lesion. Dystrophic skin calcifications at the level of the calf, linear and sheet like, mildly progressive since 2014 and 2011. IMPRESSION: 1. Advanced knee osteoarthritis. 2. Dystrophic soft tissue calcifications. Is there history of connective tissue disease? Electronically Signed   By: Monte Fantasia M.D.   On: 08/16/2015 13:52      ASSESSMENT AND PLAN: Ms. Syniah Berne is a 50 year old female who has significant cardiovascular comorbidities including hypertension with moderate left ventricular hypertrophy on prior echo Doppler study, hyperlipidemia, diabetes mellitus with peripheral neuropathy, in addition to history of diastolic heart failure, morbid obesity and COPD.  I personally reviewed both her initial split-night study, as well as her BiPAP titration study from 2016.  She has severe obstructive sleep apnea and significant oxygen desaturation which has been associated with significant increased cardiovascular morbidity.  She has now been on BiPAP at 8/14.  I thoroughly reviewed her most recent BiPAP download study which shows that she is  meeting compliance standards and averaging 10 hours per night of sleep and using it 100% of the time.  Her AHI is excellent at 2.7 per hour.  There is no mask leak.  Spent a long time with her today and discussed the adverse consequences of sleep apnea with reference to its cardiovascular risk and particularly with the severity and significant oxygen desaturation noted on her baseline study.  She is doing exceptionally well with current therapy and is meeting compliance standards.  She will return to the care of Dr. Percival Spanish.  She may need more optimal blood pressure control.  From a sleep perspective I will see her one year for reevaluation.  Time spent: 30 minutes   Troy Sine, MD, [facility]  09/04/2015 11:46 AM

## 2015-09-11 ENCOUNTER — Emergency Department (HOSPITAL_COMMUNITY)
Admission: EM | Admit: 2015-09-11 | Discharge: 2015-09-12 | Disposition: A | Discharge status: Home / Self Care | Payer: Medicaid Other | Attending: Emergency Medicine | Admitting: Emergency Medicine

## 2015-09-11 ENCOUNTER — Encounter (HOSPITAL_COMMUNITY): Payer: Self-pay | Admitting: Emergency Medicine

## 2015-09-11 ENCOUNTER — Emergency Department (HOSPITAL_COMMUNITY): Payer: Medicaid Other

## 2015-09-11 DIAGNOSIS — I10 Essential (primary) hypertension: Secondary | ICD-10-CM | POA: Diagnosis not present

## 2015-09-11 DIAGNOSIS — R600 Localized edema: Secondary | ICD-10-CM | POA: Insufficient documentation

## 2015-09-11 DIAGNOSIS — F329 Major depressive disorder, single episode, unspecified: Secondary | ICD-10-CM | POA: Diagnosis not present

## 2015-09-11 DIAGNOSIS — Z8719 Personal history of other diseases of the digestive system: Secondary | ICD-10-CM | POA: Insufficient documentation

## 2015-09-11 DIAGNOSIS — Z79899 Other long term (current) drug therapy: Secondary | ICD-10-CM | POA: Insufficient documentation

## 2015-09-11 DIAGNOSIS — J45901 Unspecified asthma with (acute) exacerbation: Secondary | ICD-10-CM | POA: Diagnosis not present

## 2015-09-11 DIAGNOSIS — M1990 Unspecified osteoarthritis, unspecified site: Secondary | ICD-10-CM | POA: Diagnosis not present

## 2015-09-11 DIAGNOSIS — E669 Obesity, unspecified: Secondary | ICD-10-CM | POA: Insufficient documentation

## 2015-09-11 DIAGNOSIS — Z7951 Long term (current) use of inhaled steroids: Secondary | ICD-10-CM | POA: Insufficient documentation

## 2015-09-11 DIAGNOSIS — R05 Cough: Secondary | ICD-10-CM

## 2015-09-11 DIAGNOSIS — G4730 Sleep apnea, unspecified: Secondary | ICD-10-CM | POA: Diagnosis not present

## 2015-09-11 DIAGNOSIS — R059 Cough, unspecified: Secondary | ICD-10-CM

## 2015-09-11 DIAGNOSIS — F419 Anxiety disorder, unspecified: Secondary | ICD-10-CM | POA: Insufficient documentation

## 2015-09-11 DIAGNOSIS — E119 Type 2 diabetes mellitus without complications: Secondary | ICD-10-CM | POA: Diagnosis not present

## 2015-09-11 DIAGNOSIS — Z87891 Personal history of nicotine dependence: Secondary | ICD-10-CM | POA: Insufficient documentation

## 2015-09-11 DIAGNOSIS — E785 Hyperlipidemia, unspecified: Secondary | ICD-10-CM | POA: Insufficient documentation

## 2015-09-11 DIAGNOSIS — Z9981 Dependence on supplemental oxygen: Secondary | ICD-10-CM | POA: Insufficient documentation

## 2015-09-11 DIAGNOSIS — R0600 Dyspnea, unspecified: Secondary | ICD-10-CM

## 2015-09-11 DIAGNOSIS — R0602 Shortness of breath: Secondary | ICD-10-CM | POA: Diagnosis present

## 2015-09-11 LAB — CBC WITH DIFFERENTIAL/PLATELET
BASOS PCT: 0 %
Basophils Absolute: 0 10*3/uL (ref 0.0–0.1)
EOS ABS: 0.1 10*3/uL (ref 0.0–0.7)
Eosinophils Relative: 1 %
HCT: 36.8 % (ref 36.0–46.0)
HEMOGLOBIN: 11.6 g/dL — AB (ref 12.0–15.0)
LYMPHS ABS: 2.1 10*3/uL (ref 0.7–4.0)
Lymphocytes Relative: 26 %
MCH: 27.4 pg (ref 26.0–34.0)
MCHC: 31.5 g/dL (ref 30.0–36.0)
MCV: 86.8 fL (ref 78.0–100.0)
Monocytes Absolute: 0.6 10*3/uL (ref 0.1–1.0)
Monocytes Relative: 8 %
NEUTROS PCT: 65 %
Neutro Abs: 5.2 10*3/uL (ref 1.7–7.7)
Platelets: 246 10*3/uL (ref 150–400)
RBC: 4.24 MIL/uL (ref 3.87–5.11)
RDW: 15.8 % — ABNORMAL HIGH (ref 11.5–15.5)
WBC: 8 10*3/uL (ref 4.0–10.5)

## 2015-09-11 LAB — COMPREHENSIVE METABOLIC PANEL
ALBUMIN: 3 g/dL — AB (ref 3.5–5.0)
ALK PHOS: 69 U/L (ref 38–126)
ALT: 60 U/L — AB (ref 14–54)
AST: 94 U/L — ABNORMAL HIGH (ref 15–41)
Anion gap: 9 (ref 5–15)
BUN: 6 mg/dL (ref 6–20)
CALCIUM: 8.5 mg/dL — AB (ref 8.9–10.3)
CO2: 26 mmol/L (ref 22–32)
CREATININE: 0.77 mg/dL (ref 0.44–1.00)
Chloride: 105 mmol/L (ref 101–111)
GFR calc Af Amer: >60 mL/min (ref >60)
GFR calc non Af Amer: >60 mL/min (ref >60)
GLUCOSE: 99 mg/dL (ref 65–99)
Potassium: 3.8 mmol/L (ref 3.5–5.1)
SODIUM: 140 mmol/L (ref 135–145)
Total Bilirubin: 0.8 mg/dL (ref 0.3–1.2)
Total Protein: 7.5 g/dL (ref 6.5–8.1)

## 2015-09-11 LAB — BRAIN NATRIURETIC PEPTIDE: B NATRIURETIC PEPTIDE 5: 57.3 pg/mL (ref 0.0–100.0)

## 2015-09-11 LAB — TROPONIN I: Troponin I: <0.03 ng/mL (ref <0.031)

## 2015-09-11 NOTE — ED Provider Notes (Signed)
CSN: [id number]     Arrival date & time 09/11/15  1928 History   First MD Initiated Contact with Patient 09/11/15 2114     Chief Complaint  Patient presents with  . Shortness of Breath     (Consider location/radiation/quality/duration/timing/severity/associated sxs/prior Treatment) HPI Comments: 50 year old female with depression, high blood pressure, diabetes, asthma, obesity, cardiomyopathy presents with congestion, productive cough bodyaches and shortness of breath the past 2 days. An early by breathing treatments. This does feel different than her asthma feels more congested and infectious like. Mild sugars breath with lying flat. No weight gain or leg swelling. No recent surgeries no blood clot history. No fevers. Patient denies congestive heart failure history.  Patient is a 50 y.o. female presenting with shortness of breath. The history is provided by the patient.  Shortness of Breath Associated symptoms: cough   Associated symptoms: no abdominal pain, no chest pain, no fever, no headaches, no neck pain, no rash and no vomiting     Past Medical History  Diagnosis Date  . Hypertension   . Asthma   . Hyperlipidemia   . Phlebitis   . Asthmatic bronchitis , chronic ([place])   . Depression   . Anxiety   . Type II diabetes mellitus ([place])   . History of blood transfusion     "related to nose would not stop bleeding"  . GERD (gastroesophageal reflux disease)   . Arthritis     "knees" (12/14/2013)  . Hypertrophic cardiomyopathy ([place])   . Vitiligo   . Shortness of breath dyspnea     with exertion  . Sleep apnea     wears CPAP  . Obesity   . Dental caries    Past Surgical History  Procedure Laterality Date  . Umbilical hernia repair  05/2004  . Cholecystectomy    . Dilation and curettage of uterus    . Hysteroscopy w/d&c N/A 08/04/2013    Procedure: DILATATION AND CURETTAGE /HYSTEROSCOPY;  Surgeon: Woodroe Mode, MD;  Location: [facility];  Service: Gynecology;  Laterality: N/A;   . Multiple extractions with alveoloplasty Bilateral 07/01/2015    Procedure: BILATERAL MULTIPLE EXTRACTIONS WITH ALVEOLOPLASTY;  Surgeon: Diona Browner, DDS;  Location: [facility];  Service: Oral Surgery;  Laterality: Bilateral;   Family History  Problem Relation Age of Onset  . Depression Mother   . Hypertension Mother   . Diabetes Mother   . Hypertension Father   . Hyperlipidemia Father   . Depression Sister    Social History  Substance Use Topics  . Smoking status: Former Smoker -- 0.25 packs/day for 19 years    Types: Cigarettes  . Smokeless tobacco: Never Used     Comment: Quit smoking cigarettes years ago 06/28/15  . Alcohol Use: No     Comment: "quit alcohol ~ 2005; only then drank occasionl wine cooler"   OB History    Gravida Para Term Preterm AB TAB SAB Ectopic Multiple Living   0 0 0 0 0 0 0 0 0 0      Review of Systems  Constitutional: Negative for fever and chills.  HENT: Positive for congestion.   Eyes: Negative for visual disturbance.  Respiratory: Positive for cough and shortness of breath.   Cardiovascular: Negative for chest pain.  Gastrointestinal: Negative for vomiting and abdominal pain.  Genitourinary: Negative for dysuria and flank pain.  Musculoskeletal: Positive for arthralgias. Negative for back pain, neck pain and neck stiffness.  Skin: Negative for rash.  Neurological: Negative for light-headedness and headaches.  Allergies  Review of patient's allergies indicates no known allergies.  Home Medications   Prior to Admission medications   Medication Sig Start Date End Date Taking? Authorizing Provider  albuterol (VENTOLIN HFA) 108 (90 BASE) MCG/ACT inhaler INHALE 2 PUFFS INTO THE LUNGS EVERY 4 HOURS AS NEEDED FOR WHEEZING OR SHORTNESS OF BREATH. (MAP) 04/15/15   Lance Bosch, NP  ARIPiprazole (ABILIFY) 20 MG tablet Take 1 tablet (20 mg total) by mouth daily. 09/16/13   Tresa Garter, MD  atorvastatin (LIPITOR) 20 MG tablet Take 1 tablet (20  mg total) by mouth daily. 01/17/15   Lance Bosch, NP  Blood Glucose Monitoring Suppl (ACCU-CHEK AVIVA PLUS) W/DEVICE KIT USE AS INSTRUCTED 04/26/15   Tresa Garter, MD  DULoxetine (CYMBALTA) 60 MG capsule Take 1 capsule (60 mg total) by mouth daily. 03/05/14   Tresa Garter, MD  fluticasone (FLONASE) 50 MCG/ACT nasal spray Place 1 spray into both nostrils daily. 06/01/14   Lance Bosch, NP  Fluticasone-Salmeterol (ADVAIR) 250-50 MCG/DOSE AEPB Inhale 1 puff into the lungs every 12 (twelve) hours. For shortness of breath 09/02/13   Tresa Garter, MD  gabapentin (NEURONTIN) 600 MG tablet Take 1 tablet (600 mg total) by mouth 3 (three) times daily. 04/30/13   Robbie Lis, MD  glucose blood (ACCU-CHEK AVIVA PLUS) test strip Use as instructed 04/26/15   Tresa Garter, MD  hydrOXYzine (ATARAX/VISTARIL) 25 MG tablet  06/24/15   Historical Provider, MD  Lancets (ACCU-CHEK SOFT TOUCH) lancets Use as instructed 04/26/15   Tresa Garter, MD  meloxicam (MOBIC) 15 MG tablet Take one tablet daily as needed 04/19/15   Thurman Coyer, DO  nystatin cream (MYCOSTATIN) Apply 1 application topically 2 (two) times daily. 07/13/15   Lance Bosch, NP  sertraline (ZOLOFT) 100 MG tablet Take 1 tablet (100 mg total) by mouth daily. 10/04/14   Arnoldo Morale, MD  verapamil (VERELAN PM) 180 MG 24 hr capsule Take 180 mg by mouth daily with breakfast.    Historical Provider, MD   BP 146/92 mmHg  Pulse 91  Temp(Src) 98.4 F (36.9 C) (Oral)  Resp 18  Ht 5' 8"  (1.727 m)  Wt 298 lb (135.172 kg)  BMI 45.32 kg/m2  SpO2 94%  LMP 03/27/2014 Physical Exam  Constitutional: She is oriented to person, place, and time. She appears well-developed and well-nourished.  HENT:  Head: Normocephalic and atraumatic.  Mouth/Throat: Oropharyngeal exudate:  congested nasally.  Eyes: Conjunctivae are normal. Right eye exhibits no discharge. Left eye exhibits no discharge.  Neck: Normal range of motion. Neck  supple. No tracheal deviation present.  Cardiovascular: Normal rate and regular rhythm.   Pulmonary/Chest: Effort normal and breath sounds normal.  Abdominal: Soft. She exhibits no distension. There is no tenderness. There is no guarding.  Musculoskeletal: She exhibits edema (mild bilateral lower extremities).  Neurological: She is alert and oriented to person, place, and time.  Skin: Skin is warm. No rash noted.  Psychiatric: She has a normal mood and affect.  Nursing note and vitals reviewed.   ED Course  Procedures (including critical care time) Labs Review Labs Reviewed  CBC WITH DIFFERENTIAL/PLATELET - Abnormal; Notable for the following:    Hemoglobin 11.6 (*)    RDW 15.8 (*)    All other components within normal limits  COMPREHENSIVE METABOLIC PANEL - Abnormal; Notable for the following:    Calcium 8.5 (*)    Albumin 3.0 (*)    AST 94 (*)  ALT 60 (*)    All other components within normal limits  BRAIN NATRIURETIC PEPTIDE  TROPONIN I    Imaging Review Dg Chest 2 View  09/11/2015  CLINICAL DATA:  Cough and body aches for 2 days. Shortness of breath. EXAM: CHEST  2 VIEW COMPARISON:  09/24/2014 FINDINGS: Moderate enlargement of the cardiopericardial silhouette noted with indistinct pulmonary vasculature and cephalization of blood flow. Faint interstitial accentuation. Thoracic spondylosis noted. No blunting of the costophrenic angles identified. IMPRESSION: 1. Moderate enlargement of the cardiopericardial silhouette with pulmonary venous hypertension and likely mild interstitial edema. Atypical infectious process possible but less likely given the overall pattern. 2. Thoracic spondylosis. Electronically Signed   By: Van Clines M.D.   On: 09/11/2015 20:06   I have personally reviewed and evaluated these images and lab results as part of my medical decision-making.   EKG Interpretation   Date/Time:  Sunday September 11 2015 19:38:52 EDT Ventricular Rate:  93 PR  Interval:  158 QRS Duration: 102 QT Interval:  388 QTC Calculation: 482 R Axis:   -43 Text Interpretation:  Sinus rhythm with occasional Premature ventricular  complexes Possible Left atrial enlargement Left axis deviation Confirmed  by Velda Wendt  MD, Yides Saidi ([id number]) on 09/11/2015 10:29:29 PM      MDM   Final diagnoses:  Cough  Dyspnea   Patient presents with clinical concern for infectious with cough congestion bodyaches. Shortness breath different than asthma and chest x-ray showing venous congestion/mild edema cardiac screen in the ER including BNP and troponin. Patient not requiring auction. Patient likely will follow closely outpatient.  Patient denies classic blood clot risk factors. Patient improved in the ER. Patient not requiring oxygen normal heart rate. Screening cardiac workup unremarkable. Patient stable for outpatient follow-up.  Results and differential diagnosis were discussed with the patient/parent/guardian. Xrays were independently reviewed by myself.  Close follow up outpatient was discussed, comfortable with the plan.   Medications - No data to display  Filed Vitals:   09/11/15 2145 09/11/15 2200 09/11/15 2215 09/11/15 2300  BP: 137/85 143/97 142/95 146/92  Pulse: 86 90 91 91  Temp:      TempSrc:      Resp: 20   18  Height:      Weight:      SpO2: 98% 94% 96% 94%    Final diagnoses:  Cough  Dyspnea        Elnora Morrison, MD 09/11/15 2357

## 2015-09-11 NOTE — ED Notes (Signed)
Pt c/o cough, body aches x 2 days with worsening sob unrelieved by breathing tx.

## 2015-09-11 NOTE — Discharge Instructions (Signed)
If you were given medicines take as directed.  If you are on coumadin or contraceptives realize their levels and effectiveness is altered by many different medicines.  If you have any reaction (rash, tongues swelling, other) to the medicines stop taking and see a physician.    If your blood pressure was elevated in the ER make sure you follow up for management with a primary doctor or return for chest pain, shortness of breath or stroke symptoms.  Please follow up as directed and return to the ER or see a physician for new or worsening symptoms.  Thank you. Filed Vitals:   09/11/15 2145 09/11/15 2200 09/11/15 2215 09/11/15 2300  BP: 137/85 143/97 142/95 146/92  Pulse: 86 90 91 91  Temp:      TempSrc:      Resp: 20   18  Height:      Weight:      SpO2: 98% 94% 96% 94%

## 2015-09-26 MED FILL — ATORVASTATIN 20 MG TABLET: 20 | 30 days supply | Qty: 30 | Fill #2

## 2015-09-26 MED FILL — VERAPAMIL ER 180 MG TABLET: 180 | 30 days supply | Qty: 30 | Fill #1

## 2015-09-26 MED FILL — ARIPiprazole 20 MG TABS: 20 | 30 days supply | Qty: 30 | Fill #1

## 2015-09-26 MED FILL — MELOXICAM 15 MG TABLET: 15 | 30 days supply | Qty: 30 | Fill #1

## 2015-09-27 MED FILL — DULoxetine HCL 60 MG CPEP: 60 | 30 days supply | Qty: 30 | Fill #1

## 2015-10-28 ENCOUNTER — Other Ambulatory Visit: Payer: Self-pay

## 2015-10-28 DIAGNOSIS — Z1231 Encounter for screening mammogram for malignant neoplasm of breast: Secondary | ICD-10-CM

## 2015-11-02 MED FILL — SERTRALINE HCL 100 MG TAB: 100 | 30 days supply | Qty: 30 | Fill #0

## 2015-11-11 ENCOUNTER — Ambulatory Visit
Admission: RE | Admit: 2015-11-11 | Discharge: 2015-11-11 | Disposition: A | Discharge status: Home / Self Care | Payer: Medicaid Other | Source: Ambulatory Visit

## 2015-11-11 DIAGNOSIS — Z1231 Encounter for screening mammogram for malignant neoplasm of breast: Secondary | ICD-10-CM

## 2015-11-29 ENCOUNTER — Telehealth: Payer: Self-pay | Admitting: *Deleted

## 2015-11-29 NOTE — Telephone Encounter (Signed)
Patient verified DOB Patient is aware of screening mammogram showing no evidence of malignancy. Patient is aware of a repeat being recommended in one year. No further questions at this time.

## 2015-11-29 NOTE — Telephone Encounter (Signed)
-----   Message from Quentin Angstlugbemiga E Jegede, MD sent at 11/16/2015 12:52 PM EDT ----- Please inform patient that her screening mammogram shows no evidence of malignancy. Recommend screening mammogram in one year

## 2015-12-06 ENCOUNTER — Encounter: Payer: Self-pay | Admitting: Sports Medicine

## 2015-12-06 ENCOUNTER — Ambulatory Visit (INDEPENDENT_AMBULATORY_CARE_PROVIDER_SITE_OTHER): Payer: Medicaid Other | Admitting: Sports Medicine

## 2015-12-06 VITALS — BP 153/87 | HR 86 | Ht 68.0 in | Wt 305.0 lb

## 2015-12-06 DIAGNOSIS — M25569 Pain in unspecified knee: Secondary | ICD-10-CM | POA: Diagnosis not present

## 2015-12-06 DIAGNOSIS — G8929 Other chronic pain: Secondary | ICD-10-CM | POA: Diagnosis not present

## 2015-12-06 DIAGNOSIS — M1711 Unilateral primary osteoarthritis, right knee: Secondary | ICD-10-CM

## 2015-12-06 DIAGNOSIS — M1712 Unilateral primary osteoarthritis, left knee: Secondary | ICD-10-CM | POA: Diagnosis not present

## 2015-12-06 NOTE — Progress Notes (Signed)
   Subjective:    Patient ID: Beth [name], female    DOB: 09/26/1965, 50 y.o.   MRN: [id number]  HPI chief complaint: Bilateral knee pain  Patient comes in today with returning bilateral knee pain. She has a well-documented history of end stage DJD in each knee. Her knees were last injected with cortisone 4 months ago and she got no symptom relief with this. She takes Mobic a daily basis and this does seem to help. Pain continues to be diffuse in both knees. Intermittent swelling. No mechanical symptoms.    Review of Systems     Objective:   Physical Exam Obese. No acute distress  Examination of each knee shows good range of motion with 1+ patellofemoral crepitus. She is tender to palpation along the medial joint lines. Pain but no popping with McMurray's. Knees are grossly stable to ligamentous exam. Neurovascularly intact distally. Walking with a limp.       Assessment & Plan:   Bilateral knee pain secondary to end-stage DJD Obesity  Although the patient has several medical comorbidities that may preclude her from surgery, I would still like for her to meet in consultation with Dr. Turner Danielsowan to see whether or not he thinks she is a surgical candidate. She has certainly failed conservative efforts to date including multiple cortisone injections. Definitive treatment is a total knee arthroplasty but if she is unable to have surgery then maybe Dr. Turner Danielsowan can offer her viscosupplementation. She will continue on her meloxicam since it does seem to help. I'll defer further workup and treatment to the discretion of Dr. Turner Danielsowan. Follow-up with me when necessary.

## 2015-12-19 MED FILL — PROAIR HFA 90 MCG INHALER: 108 (90 BAS | 11 days supply | Qty: 9 | Fill #2

## 2015-12-19 MED FILL — VERAPAMIL ER 180 MG TABLET: 180 | 30 days supply | Qty: 30 | Fill #2

## 2015-12-19 MED FILL — DULoxetine HCL 60 MG CPEP: 60 | 30 days supply | Qty: 30 | Fill #0

## 2015-12-19 MED FILL — SERTRALINE HCL 100 MG TAB: 100 | 30 days supply | Qty: 30 | Fill #1

## 2015-12-19 MED FILL — ARIPiprazole 20 MG TABS: 20 | 30 days supply | Qty: 30 | Fill #0

## 2015-12-19 MED FILL — MELOXICAM 15 MG TABLET: 15 | 30 days supply | Qty: 30 | Fill #2

## 2016-01-01 IMAGING — CR DG CHEST 2V
2 series · 2 of 2 positions shown · non-contrast
Comparison: 12/13/2013.

CLINICAL DATA: Shortness of breath.

EXAM:
CHEST  2 VIEW

[chest pa]
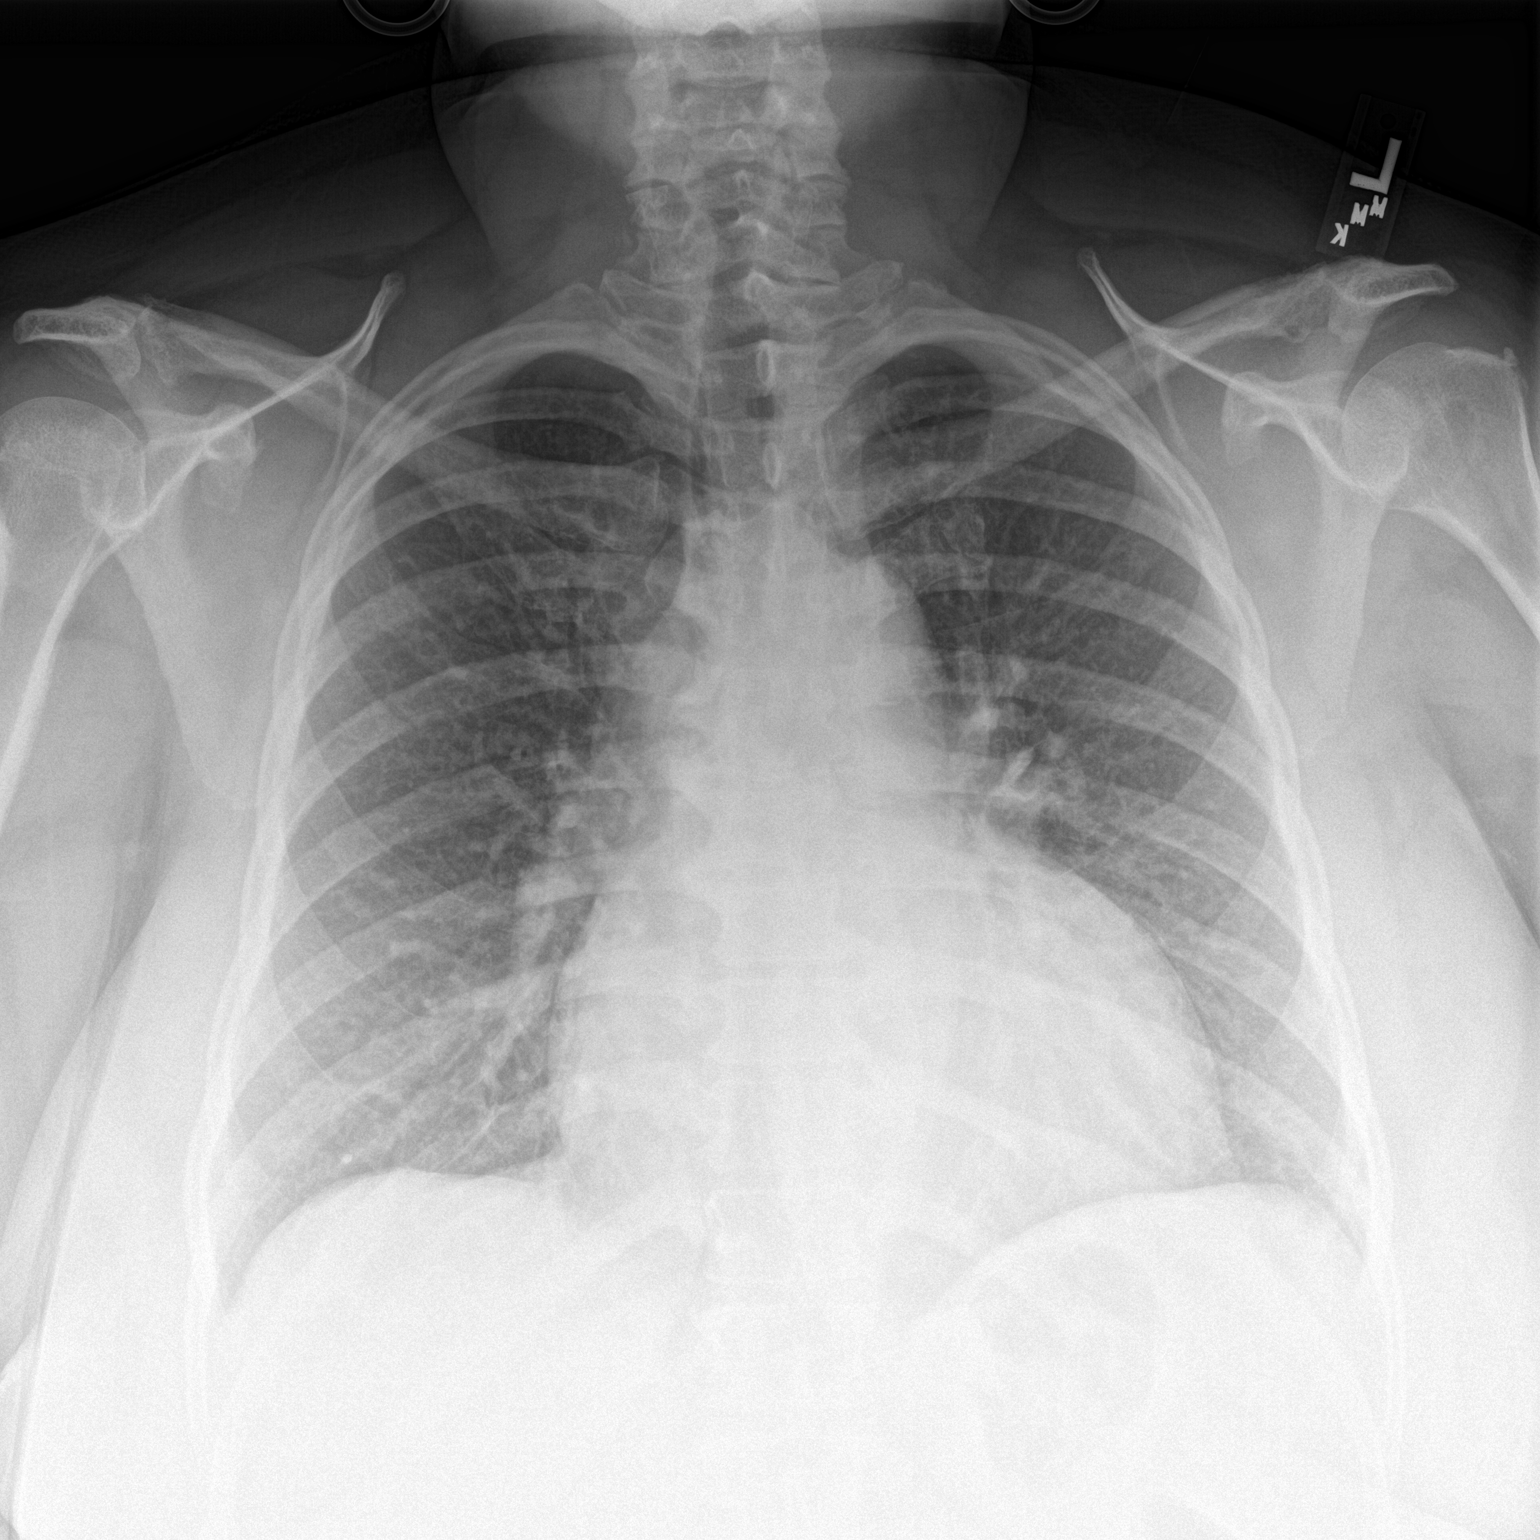

[chest lat]
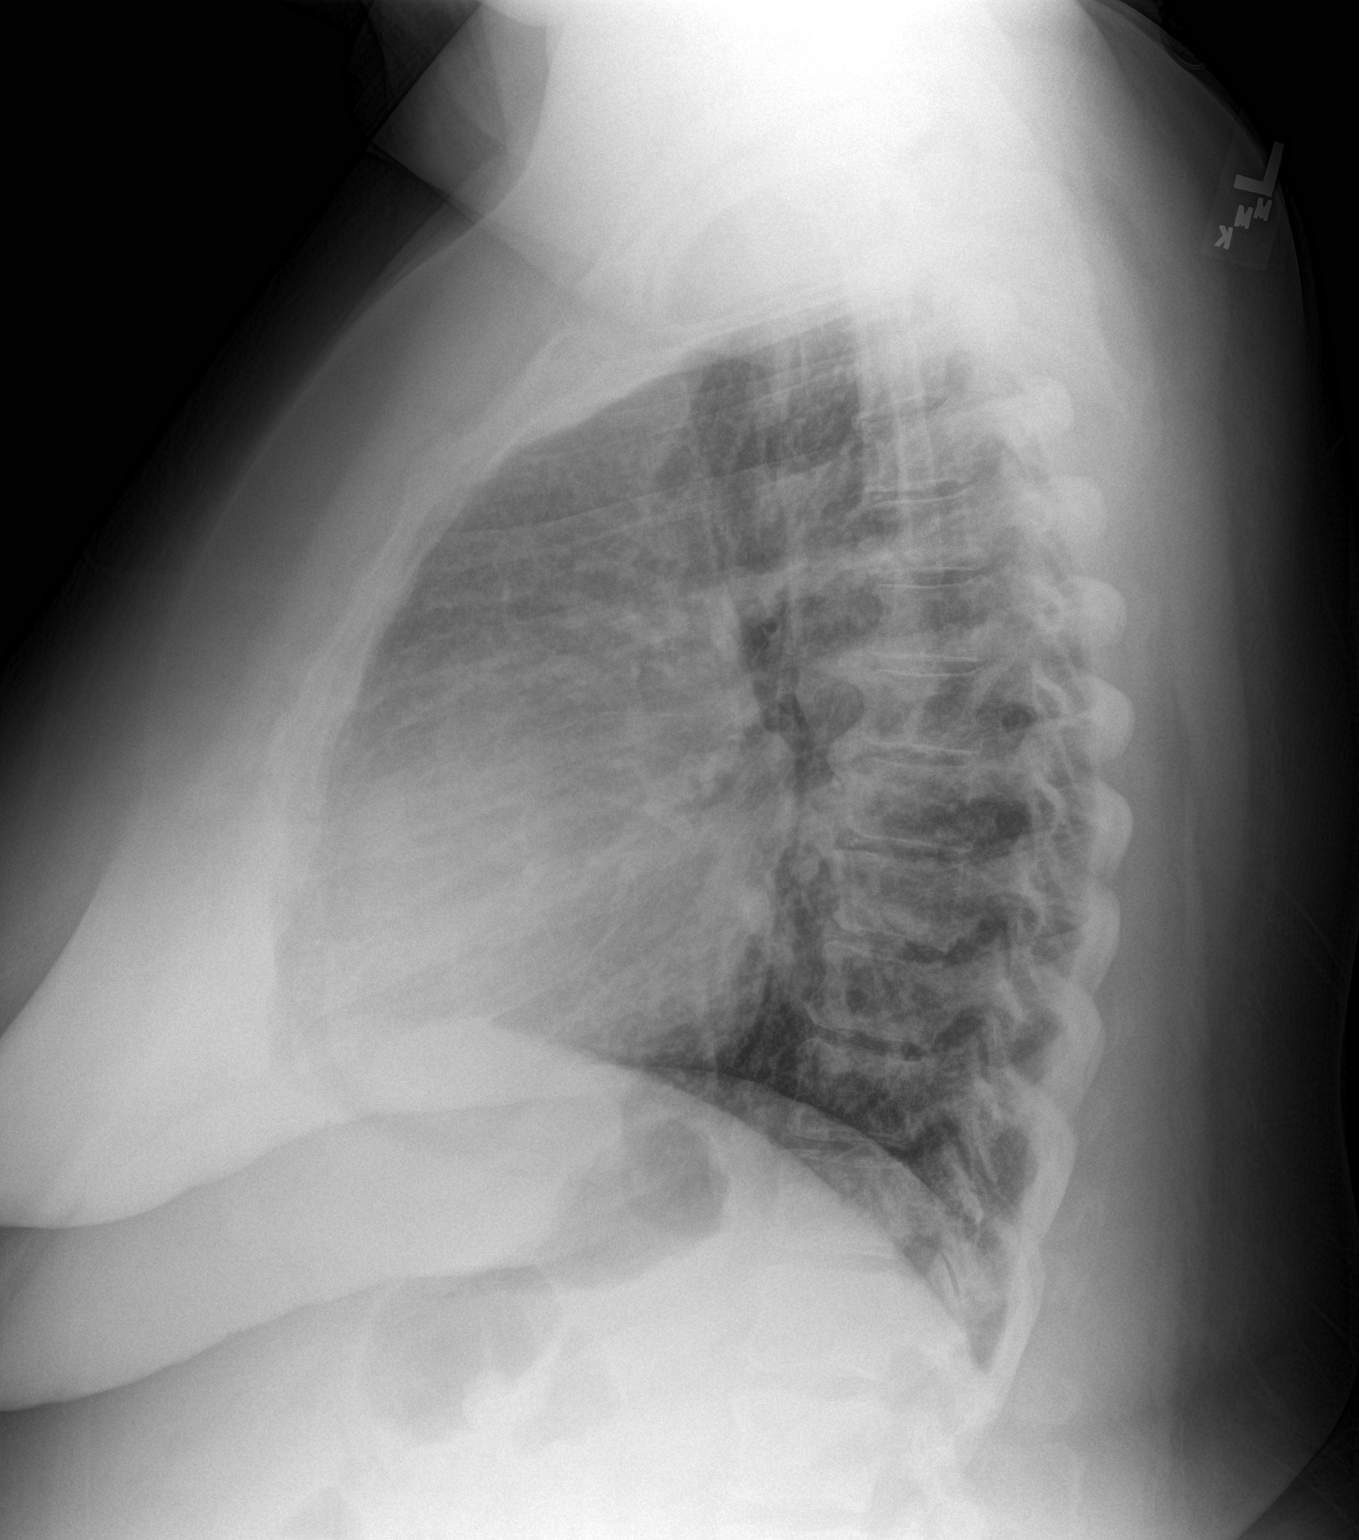

[2 of 2 positions shown; findings below may reference images not displayed]

FINDINGS: Mediastinum hilar structures normal. Cardiomegaly pulmonary vascular
prominence and interstitial prominence consistent congestive heart
failure noted. No pleural effusion or pneumothorax. Degenerative
changes thoracic spine .
IMPRESSION: Congestive heart failure pulmonary interstitial edema.

## 2016-01-20 ENCOUNTER — Encounter (HOSPITAL_COMMUNITY): Payer: Self-pay | Admitting: Emergency Medicine

## 2016-01-20 ENCOUNTER — Emergency Department (HOSPITAL_COMMUNITY)
Admission: EM | Admit: 2016-01-20 | Discharge: 2016-01-24 | Disposition: E | Payer: Medicaid Other | Attending: Emergency Medicine | Admitting: Emergency Medicine

## 2016-01-20 DIAGNOSIS — E1140 Type 2 diabetes mellitus with diabetic neuropathy, unspecified: Secondary | ICD-10-CM | POA: Insufficient documentation

## 2016-01-20 DIAGNOSIS — I469 Cardiac arrest, cause unspecified: Secondary | ICD-10-CM | POA: Insufficient documentation

## 2016-01-20 DIAGNOSIS — Z7982 Long term (current) use of aspirin: Secondary | ICD-10-CM | POA: Insufficient documentation

## 2016-01-20 DIAGNOSIS — R140 Abdominal distension (gaseous): Secondary | ICD-10-CM | POA: Insufficient documentation

## 2016-01-20 DIAGNOSIS — Z87891 Personal history of nicotine dependence: Secondary | ICD-10-CM | POA: Insufficient documentation

## 2016-01-20 DIAGNOSIS — I10 Essential (primary) hypertension: Secondary | ICD-10-CM | POA: Insufficient documentation

## 2016-01-20 DIAGNOSIS — J45901 Unspecified asthma with (acute) exacerbation: Secondary | ICD-10-CM | POA: Diagnosis not present

## 2016-01-20 MED ORDER — SODIUM BICARBONATE 8.4 % IV SOLN: INTRAVENOUS | Status: AC | PRN

## 2016-01-20 MED ORDER — SODIUM BICARBONATE 8.4 % IV SOLN
INTRAVENOUS | Status: DC | PRN
  Administered 2016-01-20: 50 meq via INTRAVENOUS

## 2016-01-20 MED ORDER — EPINEPHRINE HCL 0.1 MG/ML IJ SOSY
PREFILLED_SYRINGE | INTRAMUSCULAR | Status: AC | PRN
  Administered 2016-01-20: 1 via INTRAVENOUS

## 2016-01-20 NOTE — ED Triage Notes (Signed)
Patient's family called EMS for shortness of breath.  Patient was found by FD with pink, frothy sputum from mouth and nose.  Upon EMS arrival, patient was in cardiac arrest, CPR in progress by FD.  Patient was given 9 epis en route to ED, of NS en route to ED.  Patient was found to be in asystole and remained in asystole en route to ED.   End [place]idal CO2 was 32 en route to ED.

## 2016-01-20 NOTE — Progress Notes (Signed)
   15-Feb-2016 2200  Clinical Encounter Type  Visited With Family  Visit Type Death  Referral From Nurse  Consult/Referral To Chaplain  Spiritual Encounters  Spiritual Needs Grief support  Stress Factors  Family Stress Factors Loss of control;Major life changes  Chaplain responded to page, patient deceased, escorted family to consult room, provided grief support, hospitality staff support and prayer.

## 2016-01-20 NOTE — Code Documentation (Signed)
Family at beside. Family given emotional support. 

## 2016-01-20 NOTE — ED Notes (Signed)
Placed pt in body bag with toe tag in place.  Pt made presentable to family.  All tubes left in place at this time.

## 2016-01-20 NOTE — ED Provider Notes (Signed)
[facility] DEPT Provider Note   CSN: [id number] Arrival date & time: 01/15/2016  2154  First Provider Contact:  2154   History   Chief Complaint Chief Complaint  Patient presents with  . Cardiac Arrest    HPI Beth [name] is a 50 y.o. female.  The history is provided by the EMS personnel. The history is limited by the condition of the patient.  Cardiac Arrest  Witnessed by:  Healthcare provider Incident location:  Home Time since incident:  1 hour Time before BLS initiated:  Immediate Time before ALS initiated:  Immediate Condition upon EMS arrival:  Agonal respirations Initial cardiac rhythm per EMS:  PEA Airway: [place] airway. Rhythm on admission to ED:  Unchanged Associated symptoms: difficulty breathing   Risk factors: COPD, diabetes mellitus, heart problem and hyperlipidemia     Past Medical History:  Diagnosis Date  . Anxiety   . Arthritis    "knees" (12/14/2013)  . Asthma   . Asthmatic bronchitis , chronic ([place])   . Dental caries   . Depression   . GERD (gastroesophageal reflux disease)   . History of blood transfusion    "related to nose would not stop bleeding"  . Hyperlipidemia   . Hypertension   . Hypertrophic cardiomyopathy ([place])   . Obesity   . Phlebitis   . Shortness of breath dyspnea    with exertion  . Sleep apnea    wears CPAP  . Type II diabetes mellitus ([place])   . Vitiligo     Patient Active Problem List   Diagnosis Date Noted  . OSA treated with BiPAP 09/04/2015  . Diabetes mellitus with neuropathy ([place]) 09/04/2015  . Hypertrophic cardiomyopathy ([place]) 10/04/2014  . Essential hypertension   . Diabetes type 2, uncontrolled ([place])   . HLD (hyperlipidemia) 09/24/2014  . Morbid obesity ([place]) 09/24/2014  . Acute asthma exacerbation 12/14/2013  . Asthma exacerbation 12/14/2013  . Asthma 12/14/2013  . HTN (hypertension) 10/12/2013  . Diabetes ([place]) 10/12/2013  . Complex endometrial hyperplasia with atypia 09/03/2013  . Changes in  skin texture 07/14/2013  . Abnormal uterine bleeding 06/08/2013  . Preventative health care 04/30/2013  . Degenerative joint disease of knee, left 09/19/2012  . Chronic knee pain 09/19/2012  . DM type 2 (diabetes mellitus, type 2) ([place]) 05/25/2011  . Depression 05/23/2011  . Neuropathy ([place]) 05/23/2011  . HYPERLIPIDEMIA 08/22/2006  . HYPERTENSION, BENIGN SYSTEMIC 08/22/2006    Past Surgical History:  Procedure Laterality Date  . CHOLECYSTECTOMY    . DILATION AND CURETTAGE OF UTERUS    . HYSTEROSCOPY W/D&C N/A 08/04/2013   Procedure: DILATATION AND CURETTAGE /HYSTEROSCOPY;  Surgeon: Woodroe Mode, MD;  Location: [facility];  Service: Gynecology;  Laterality: N/A;  . MULTIPLE EXTRACTIONS WITH ALVEOLOPLASTY Bilateral 07/01/2015   Procedure: BILATERAL MULTIPLE EXTRACTIONS WITH ALVEOLOPLASTY;  Surgeon: Diona Browner, DDS;  Location: [facility];  Service: Oral Surgery;  Laterality: Bilateral;  . UMBILICAL HERNIA REPAIR  05/2004    OB History    Gravida Para Term Preterm AB Living   0 0 0 0 0 0   SAB TAB Ectopic Multiple Live Births   0 0 0 0         Home Medications    Prior to Admission medications   Medication Sig Start Date End Date Taking? Authorizing Provider  albuterol (VENTOLIN HFA) 108 (90 BASE) MCG/ACT inhaler INHALE 2 PUFFS INTO THE LUNGS EVERY 4 HOURS AS NEEDED FOR WHEEZING OR SHORTNESS OF BREATH. (MAP) 04/15/15  Lance Bosch, NP  ARIPiprazole (ABILIFY) 20 MG tablet Take 1 tablet (20 mg total) by mouth daily. 09/16/13   Tresa Garter, MD  aspirin-acetaminophen-caffeine (EXCEDRIN MIGRAINE) [phone] MG tablet Take 1 tablet by mouth every 6 (six) hours as needed for headache.    Historical Provider, MD  atorvastatin (LIPITOR) 20 MG tablet Take 1 tablet (20 mg total) by mouth daily. 01/17/15   Lance Bosch, NP  Blood Glucose Monitoring Suppl (ACCU-CHEK AVIVA PLUS) W/DEVICE KIT USE AS INSTRUCTED 04/26/15   Tresa Garter, MD  DULoxetine (CYMBALTA) 60 MG capsule Take 1  capsule (60 mg total) by mouth daily. 03/05/14   Tresa Garter, MD  fluticasone (FLONASE) 50 MCG/ACT nasal spray Place 1 spray into both nostrils daily. 06/01/14   Lance Bosch, NP  Fluticasone-Salmeterol (ADVAIR) 250-50 MCG/DOSE AEPB Inhale 1 puff into the lungs every 12 (twelve) hours. For shortness of breath 09/02/13   Tresa Garter, MD  gabapentin (NEURONTIN) 600 MG tablet Take 1 tablet (600 mg total) by mouth 3 (three) times daily. 04/30/13   Robbie Lis, MD  glucose blood (ACCU-CHEK AVIVA PLUS) test strip Use as instructed 04/26/15   Tresa Garter, MD  hydrOXYzine (ATARAX/VISTARIL) 25 MG tablet  06/24/15   Historical Provider, MD  Lancets (ACCU-CHEK SOFT TOUCH) lancets Use as instructed 04/26/15   Tresa Garter, MD  meloxicam (MOBIC) 15 MG tablet Take one tablet daily as needed 04/19/15   Thurman Coyer, DO  nystatin cream (MYCOSTATIN) Apply 1 application topically 2 (two) times daily. 07/13/15   Lance Bosch, NP  sertraline (ZOLOFT) 100 MG tablet Take 1 tablet (100 mg total) by mouth daily. 10/04/14   Arnoldo Morale, MD  verapamil (VERELAN PM) 180 MG 24 hr capsule Take 180 mg by mouth daily with breakfast.    Historical Provider, MD    Family History Family History  Problem Relation Age of Onset  . Depression Mother   . Hypertension Mother   . Diabetes Mother   . Hypertension Father   . Hyperlipidemia Father   . Depression Sister     Social History Social History  Substance Use Topics  . Smoking status: Former Smoker    Packs/day: 0.25    Years: 19.00    Types: Cigarettes  . Smokeless tobacco: Never Used     Comment: Quit smoking cigarettes years ago 06/28/15  . Alcohol use No     Comment: "quit alcohol ~ 2005; only then drank occasionl wine cooler"     Allergies   Review of patient's allergies indicates no known allergies.   Review of Systems Review of Systems  Unable to perform ROS: Patient unresponsive     Physical Exam Updated Vital  Signs BP (!) 0/0   Pulse (!) 0   Temp (!) 94.6 F (34.8 C) (Axillary)   Resp (!) 0   Ht 5' 6"  (1.676 m)   Wt (!) 138.3 kg   LMP 03/27/2014   SpO2 (!) 0%   BMI 49.23 kg/m   Physical Exam  Constitutional: She appears well-developed and well-nourished. She appears distressed.  HENT:  Head: Normocephalic and atraumatic.  Neck:  Unable to assess, patient undergoing CPR  Cardiovascular:  No pulses, asystole on monitor  Pulmonary/Chest:  Mechanical ventilation, bilateral breath sounds  Abdominal: She exhibits distension.  Neurological:  Unresponsive     ED Treatments / Results  Labs (all labs ordered are listed, but only abnormal results are displayed) Labs Reviewed - No  data to display  EKG  EKG Interpretation None       Radiology No results found.  Procedures Procedures (including critical care time)  Medications Ordered in ED Medications  sodium bicarbonate injection (50 mEq Intravenous Given 12/31/2015 2154)  EPINEPHrine (ADRENALIN) 0.1 MG/ML injection (1 Syringe Intravenous Given 01/11/2016 2152)  sodium bicarbonate injection (50 mEq Intravenous Given 12/24/2015 2156)     Initial Impression / Assessment and Plan / ED Course  I have reviewed the triage vital signs and the nursing notes.  Pertinent labs & imaging results that were available during my care of the patient were reviewed by me and considered in my medical decision making (see chart for details).  Clinical Course    Patient is a 50 year old female with long cardiac and pulmonary history who presents emergency department for evaluation of unresponsiveness and cardiac arrest. Per EMS patient was complaining of respiratory difficulty at home. Upon fire department arrival patient had agonal respirations and shortly thereafter lost a pulse. CPR was immediately initiated and performed for roughly 50 minutes to 1 hour prior to arrival in the emergency department. She had received 8 rounds of epinephrine, 2  shocks for ventricular fibrillation.  Upon arrival to the emergency department patient with a [facility] airway in place. She had bilateral breath sounds. CPR was ongoing. Patient immediately given 1 mg epinephrine. At first pulse check station patient was found to be in asystole. At this point CPR continued and patient was given 1 amp of bicarbonate. Physical exam revealed a markedly distended abdomen (unclear if patient's baseline). Patient had good vascular access with an 18-gauge IV placed in the emergency department and an IO placed in the field.  Patient with prolonged CPR with 9 total rounds of epinephrine given. Unclear cause of patient's symptoms however with prolonged CPR in asystole on monitor and no cardiac activity on bedside ultrasound decision was made by myself and rest the treatment team to terminate CPR efforts and family and patient's family physician was notified.  Discussed case with my attending, Dr. Vanita Panda.    Final Clinical Impressions(s) / ED Diagnoses   Final diagnoses:  Cardiac arrest [facility])    New Prescriptions New Prescriptions   No medications on file     Vira Blanco, MD 01/02/2016 [place], MD 01/22/16 [phone]

## 2016-01-20 NOTE — Code Documentation (Signed)
Dr Darol Destine at bedside with ultrasound, no cardiac activity.  TOD called by Dr Jeraldine Loots and Dr Moody Bruins at 2156.

## 2016-01-20 NOTE — Code Documentation (Signed)
Patient time of death occurred at 2156. 

## 2016-01-24 ENCOUNTER — Telehealth: Payer: Self-pay | Admitting: Internal Medicine

## 2016-01-24 NOTE — ED Notes (Signed)
Patient taken to morgue. 

## 2016-01-24 NOTE — Telephone Encounter (Signed)
The funeral home came to the facility to drop off a death certificate to be filled out. Form will be put in providers box. Please f/u

## 2016-01-24 DEATH — deceased
